# Patient Record
Sex: Female | Born: 1980
Health system: Southern US, Community
[De-identification: ages and names within clinical notes are randomized; demographics above are authoritative.]

## PROBLEM LIST (undated history)

## (undated) ENCOUNTER — Ambulatory Visit (HOSPITAL_COMMUNITY): Admission: EM | Payer: BLUE CROSS/BLUE SHIELD | Source: Home / Self Care

## (undated) DIAGNOSIS — M549 Dorsalgia, unspecified: Secondary | ICD-10-CM

## (undated) DIAGNOSIS — G8929 Other chronic pain: Secondary | ICD-10-CM

## (undated) DIAGNOSIS — M479 Spondylosis, unspecified: Secondary | ICD-10-CM

## (undated) DIAGNOSIS — M25559 Pain in unspecified hip: Secondary | ICD-10-CM

## (undated) DIAGNOSIS — R7303 Prediabetes: Secondary | ICD-10-CM

## (undated) DIAGNOSIS — G43909 Migraine, unspecified, not intractable, without status migrainosus: Secondary | ICD-10-CM

## (undated) DIAGNOSIS — K5900 Constipation, unspecified: Secondary | ICD-10-CM

## (undated) DIAGNOSIS — R12 Heartburn: Secondary | ICD-10-CM

## (undated) DIAGNOSIS — M2550 Pain in unspecified joint: Secondary | ICD-10-CM

## (undated) DIAGNOSIS — R0602 Shortness of breath: Secondary | ICD-10-CM

## (undated) DIAGNOSIS — F32A Depression, unspecified: Secondary | ICD-10-CM

## (undated) DIAGNOSIS — E559 Vitamin D deficiency, unspecified: Secondary | ICD-10-CM

## (undated) HISTORY — DX: Migraine, unspecified, not intractable, without status migrainosus: G43.909

## (undated) HISTORY — DX: Constipation, unspecified: K59.00

## (undated) HISTORY — DX: Pain in unspecified hip: M25.559

## (undated) HISTORY — DX: Pain in unspecified joint: M25.50

## (undated) HISTORY — DX: Depression, unspecified: F32.A

## (undated) HISTORY — DX: Vitamin D deficiency, unspecified: E55.9

## (undated) HISTORY — DX: Prediabetes: R73.03

## (undated) HISTORY — DX: Shortness of breath: R06.02

## (undated) HISTORY — DX: Spondylosis, unspecified: M47.9

## (undated) HISTORY — DX: Other chronic pain: G89.29

## (undated) HISTORY — DX: Morbid (severe) obesity due to excess calories: E66.01

## (undated) HISTORY — DX: Heartburn: R12

## (undated) HISTORY — DX: Dorsalgia, unspecified: M54.9

---

## 2014-06-16 ENCOUNTER — Inpatient Hospital Stay
Admit: 2014-06-16 | Discharge: 2014-06-16 | Disposition: A | Discharge status: Home / Self Care | Payer: BLUE CROSS/BLUE SHIELD | Attending: Emergency Medicine

## 2014-06-16 ENCOUNTER — Emergency Department: Admit: 2014-06-16 | Payer: BLUE CROSS/BLUE SHIELD

## 2014-06-16 DIAGNOSIS — G44209 Tension-type headache, unspecified, not intractable: Secondary | ICD-10-CM

## 2014-06-16 LAB — HCG URINE, QL. - POC: Pregnancy test,urine (POC): NEGATIVE

## 2014-06-16 MED ORDER — IBUPROFEN 800 MG TAB
800 mg | ORAL_TABLET | Freq: Three times a day (TID) | ORAL | Status: AC | PRN
Start: 2014-06-16 — End: ?

## 2014-06-16 NOTE — ED Notes (Signed)
I have reviewed discharge instructions with the patient.  The patient verbalized understanding.

## 2014-06-16 NOTE — ED Provider Notes (Signed)
HPI Comments: 34 year old African-American female presents today with occipital headache that started last night.  She states that she has had multiple headaches at least 2-3 headaches per week for the last 3-4 months.  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  She states it started out as a small bone mass 13 years ago but in the last year she states she has noticed that it is grown considerably and is painful at times.  Patient also reports dizziness and the fact that she feels lightheaded.  Denies visual disturbances, photophobia, neck pain.  She states she has not had a dilated eye exam and quite some time.  She reports that her last menstrual period was approximately 2 years ago however she has an IUD in place for birth control but denies being sexually active currently.  Patient also reports nausea but denies vomiting.  Urine pregnancy test here is negative.  ED urine dipstick is negative.    Patient is a 34 y.o. female presenting with headaches. The history is provided by the patient.   Headache   This is a new problem. The current episode started yesterday. The problem occurs constantly. The problem has not changed since onset.The headache is aggravated by nothing. The pain is located in the occipital region. The quality of the pain is described as dull. The pain is at a severity of 5/10. The pain is moderate. Associated symptoms include dizziness and nausea. Pertinent negatives include no fever, no malaise/fatigue, no palpitations, no syncope, no shortness of breath, no weakness, no tingling, no visual change and no vomiting. She has tried nothing for the symptoms.        History reviewed. No pertinent past medical history.    Past Surgical History:   Procedure Laterality Date   ??? Hx gyn       c section          History reviewed. No pertinent family history.    History      Social History   ??? Marital Status: DIVORCED     Spouse Name: N/A   ??? Number of Children: N/A   ??? Years of Education: N/A     Occupational History   ??? Not on file.     Social History Main Topics   ??? Smoking status: Never Smoker    ??? Smokeless tobacco: Not on file   ??? Alcohol Use: No   ??? Drug Use: No   ??? Sexual Activity: No     Other Topics Concern   ??? Not on file     Social History Narrative   ??? No narrative on file           ALLERGIES: Review of patient's allergies indicates no known allergies.      Review of Systems   Constitutional: Negative for fever, chills, malaise/fatigue, diaphoresis, activity change, appetite change and fatigue.   HENT: Negative for congestion, dental problem, ear pain, facial swelling, postnasal drip, rhinorrhea, sore throat and trouble swallowing.    Eyes: Negative for photophobia, pain and visual disturbance.   Respiratory: Negative for cough, chest tightness and shortness of breath.    Cardiovascular: Negative for chest pain, palpitations and syncope.   Gastrointestinal: Positive for nausea. Negative for vomiting, abdominal pain and diarrhea.   Musculoskeletal: Negative for myalgias, arthralgias, neck pain and neck stiffness.   Neurological: Positive for  dizziness and headaches. Negative for tingling, tremors, seizures, syncope, speech difficulty, weakness, light-headedness and numbness.   Psychiatric/Behavioral: Negative for sleep disturbance. The patient is not nervous/anxious.    All other systems reviewed and are negative.      Filed Vitals:    06/16/14 1011   BP: 133/71   Pulse: 78   Temp: 98.1 ??F (36.7 ??C)   Resp: 16   Height: 5' (1.524 m)   Weight: 113.399 kg (250 lb)   SpO2: 99%            Physical Exam   Constitutional: She is oriented to person, place, and time. Vital signs are normal. She appears well-developed and well-nourished. She is active.  Non-toxic appearance. She does not appear ill. No distress.   HENT:   Head: Normocephalic and atraumatic.        Right Ear: Tympanic membrane normal.   Left Ear: Tympanic membrane normal.   Nose: Nose normal.   Mouth/Throat: Uvula is midline, oropharynx is clear and moist and mucous membranes are normal.   Eyes: Conjunctivae and EOM are normal. Pupils are equal, round, and reactive to light.   Neck: Normal range of motion. Neck supple. No spinous process tenderness and no muscular tenderness present. No Brudzinski's sign and no Kernig's sign noted.   Cardiovascular: Normal rate, regular rhythm, normal heart sounds and intact distal pulses.  Exam reveals no gallop and no friction rub.    No murmur heard.  Pulmonary/Chest: Effort normal and breath sounds normal. No respiratory distress. She has no wheezes. She has no rales.   Musculoskeletal: Normal range of motion. She exhibits no edema or tenderness.   Lymphadenopathy:     She has no cervical adenopathy.   Neurological: She is alert and oriented to person, place, and time. She has normal strength and normal reflexes. No cranial nerve deficit or sensory deficit. Coordination and gait normal.   Pt. Ambulatory and moving about on exam table and in exam room without difficulty. No focal neurological deficits identified. Cerebellar/proprioception intact.   Skin: Skin is warm, dry and intact.   Psychiatric: She has a normal mood and affect. Her speech is normal and behavior is normal.   Nursing note and vitals reviewed.       MDM  Number of Diagnoses or Management Options      Procedures      Head CT Scan:  Impression: ??  1. No acute intracranial process.?????? ??  ??  I discussed the results of all labs, procedures, radiographs, and treatments with the patient and available family. Treatment plan is agreed upon and the patient is ready for discharge. All voiced understanding of the discharge plan and medication instructions or changes as appropriate. Questions about treatment in the ED were answered. All were encouraged to return should symptoms worsen or new problems develop.

## 2014-06-16 NOTE — ED Notes (Signed)
Pt reports HA since last night pain has improved some sinus pain and pressure as well

## 2014-07-07 ENCOUNTER — Encounter: Attending: Family Medicine

## 2015-06-13 ENCOUNTER — Emergency Department (HOSPITAL_COMMUNITY)
Admission: EM | Admit: 2015-06-13 | Discharge: 2015-06-13 | Disposition: A | Discharge status: Home / Self Care | Payer: BLUE CROSS/BLUE SHIELD | Attending: Emergency Medicine | Admitting: Emergency Medicine

## 2015-06-13 ENCOUNTER — Encounter (HOSPITAL_COMMUNITY): Payer: Self-pay | Admitting: Emergency Medicine

## 2015-06-13 DIAGNOSIS — J069 Acute upper respiratory infection, unspecified: Secondary | ICD-10-CM | POA: Insufficient documentation

## 2015-06-13 DIAGNOSIS — R0981 Nasal congestion: Secondary | ICD-10-CM | POA: Diagnosis present

## 2015-06-13 LAB — RAPID STREP SCREEN (MED CTR MEBANE ONLY): Streptococcus, Group A Screen (Direct): NEGATIVE

## 2015-06-13 MED ORDER — BENZONATATE 100 MG PO CAPS: 100.0000 mg | ORAL_CAPSULE | Freq: Three times a day (TID) | ORAL | Status: DC

## 2015-06-13 NOTE — ED Provider Notes (Signed)
CSN: [id number]     Arrival date & time 06/13/15  1831 History  By signing my name below, I, Phillis Haggis, attest that this documentation has been prepared under the direction and in the presence of Elpidio Anis, PA-C.  Electronically Signed: Phillis Haggis, ED Scribe. 06/13/2015. 8:53 PM.   Chief Complaint  Patient presents with  . URI   The history is provided by the patient. No language interpreter was used.  HPI Comments: Zanetta Dehaan is a 35 y.o. female who presents to the Emergency Department complaining of gradually worsening, non-productive cough onset 3 days ago. Pt reports associated fatigue, appetite change, sore throat, congestion, hot and cold chills, myalgias when laying down, and headache. She has taken Tussin and Delsym to no relief. Pt is a non-smoker. She denies nausea or vomiting.    History reviewed. No pertinent past medical history. History reviewed. No pertinent past surgical history. No family history on file. Social History  Substance Use Topics  . Smoking status: Never Smoker   . Smokeless tobacco: None  . Alcohol Use: None   OB History    No data available     Review of Systems  Constitutional: Positive for chills and appetite change. Negative for fever.  HENT: Positive for sore throat.   Respiratory: Positive for cough.   Neurological: Positive for headaches.   Allergies  Review of patient's allergies indicates not on file.  Home Medications   Prior to Admission medications   Not on File   BP 139/121 mmHg  Pulse 120  Temp(Src) 98.7 F (37.1 C) (Oral)  Resp 19  SpO2 96% Physical Exam  Constitutional: She is oriented to person, place, and time. She appears well-developed and well-nourished.  HENT:  Head: Normocephalic and atraumatic.  Nose: Mucosal edema present.  Eyes: Conjunctivae and EOM are normal. Pupils are equal, round, and reactive to light.  Neck: Normal range of motion. Neck supple.  Cardiovascular: Normal rate and regular  rhythm.   Pulmonary/Chest: Effort normal and breath sounds normal.  Abdominal: Soft. There is no tenderness.  Musculoskeletal: Normal range of motion.  Neurological: She is alert and oriented to person, place, and time.  Skin: Skin is warm and dry.  Psychiatric: She has a normal mood and affect. Her behavior is normal.  Nursing note and vitals reviewed.   ED Course  Procedures (including critical care time) DIAGNOSTIC STUDIES: Oxygen Saturation is 96% on RA, normal by my interpretation.    COORDINATION OF CARE: 9:18 PM-Discussed treatment plan which includes rapid strep screen and conservative care with pt at bedside and pt agreed to plan.    Labs Review Labs Reviewed  RAPID STREP SCREEN (NOT AT [facility])  CULTURE, GROUP A STREP [facility])   Results for orders placed or performed during the hospital encounter of 06/13/15  Rapid strep screen (not at [facility])  Result Value Ref Range   Streptococcus, Group A Screen (Direct) NEGATIVE NEGATIVE    Imaging Review No results found. I have personally reviewed and evaluated these images and lab results as part of my medical decision-making.   EKG Interpretation None      MDM   Final diagnoses:  None    1. URI  The patient presents with congestion, chills, cough for several days. Negative strep and normal exam. VSS, afebrile. Well appearing. Likely viral process.  I personally performed the services described in this documentation, which was scribed in my presence. The recorded information has been reviewed and is accurate.  Elpidio Anis, PA-C 06/13/15 2152  Tilden Fossa, MD 06/16/15 [phone]

## 2015-06-13 NOTE — Discharge Instructions (Signed)
Upper Respiratory Infection, Adult °Most upper respiratory infections (URIs) are a viral infection of the air passages leading to the lungs. A URI affects the nose, throat, and upper air passages. The most common type of URI is nasopharyngitis and is typically referred to as "the common cold." °URIs run their course and usually go away on their own. Most of the time, a URI does not require medical attention, but sometimes a bacterial infection in the upper airways can follow a viral infection. This is called a secondary infection. Sinus and middle ear infections are common types of secondary upper respiratory infections. °Bacterial pneumonia can also complicate a URI. A URI can worsen asthma and chronic obstructive pulmonary disease (COPD). Sometimes, these complications can require emergency medical care and may be life threatening.  °CAUSES °Almost all URIs are caused by viruses. A virus is a type of germ and can spread from one person to another.  °RISKS FACTORS °You may be at risk for a URI if:  °· You smoke.   °· You have chronic heart or lung disease. °· You have a weakened defense (immune) system.   °· You are very young or very old.   °· You have nasal allergies or asthma. °· You work in crowded or poorly ventilated areas. °· You work in health care facilities or schools. °SIGNS AND SYMPTOMS  °Symptoms typically develop 2-3 days after you come in contact with a cold virus. Most viral URIs last 7-10 days. However, viral URIs from the influenza virus (flu virus) can last 14-18 days and are typically more severe. Symptoms may include:  °· Runny or stuffy (congested) nose.   °· Sneezing.   °· Cough.   °· Sore throat.   °· Headache.   °· Fatigue.   °· Fever.   °· Loss of appetite.   °· Pain in your forehead, behind your eyes, and over your cheekbones (sinus pain). °· Muscle aches.   °DIAGNOSIS  °Your health care provider may diagnose a URI by: °· Physical exam. °· Tests to check that your symptoms are not due to  another condition such as: °· Strep throat. °· Sinusitis. °· Pneumonia. °· Asthma. °TREATMENT  °A URI goes away on its own with time. It cannot be cured with medicines, but medicines may be prescribed or recommended to relieve symptoms. Medicines may help: °· Reduce your fever. °· Reduce your cough. °· Relieve nasal congestion. °HOME CARE INSTRUCTIONS  °· Take medicines only as directed by your health care provider.   °· Gargle warm saltwater or take cough drops to comfort your throat as directed by your health care provider. °· Use a warm mist humidifier or inhale steam from a shower to increase air moisture. This may make it easier to breathe. °· Drink enough fluid to keep your urine clear or pale yellow.   °· Eat soups and other clear broths and maintain good nutrition.   °· Rest as needed.   °· Return to work when your temperature has returned to normal or as your health care provider advises. You may need to stay home longer to avoid infecting others. You can also use a face mask and careful hand washing to prevent spread of the virus. °· Increase the usage of your inhaler if you have asthma.   °· Do not use any tobacco products, including cigarettes, chewing tobacco, or electronic cigarettes. If you need help quitting, ask your health care provider. °PREVENTION  °The best way to protect yourself from getting a cold is to practice good hygiene.  °· Avoid oral or hand contact with people with cold   symptoms.   °· Wash your hands often if contact occurs.   °There is no clear evidence that vitamin C, vitamin E, echinacea, or exercise reduces the chance of developing a cold. However, it is always recommended to get plenty of rest, exercise, and practice good nutrition.  °SEEK MEDICAL CARE IF:  °· You are getting worse rather than better.   °· Your symptoms are not controlled by medicine.   °· You have chills. °· You have worsening shortness of breath. °· You have brown or red mucus. °· You have yellow or brown nasal  discharge. °· You have pain in your face, especially when you bend forward. °· You have a fever. °· You have swollen neck glands. °· You have pain while swallowing. °· You have white areas in the back of your throat. °SEEK IMMEDIATE MEDICAL CARE IF:  °· You have severe or persistent: °¨ Headache. °¨ Ear pain. °¨ Sinus pain. °¨ Chest pain. °· You have chronic lung disease and any of the following: °¨ Wheezing. °¨ Prolonged cough. °¨ Coughing up blood. °¨ A change in your usual mucus. °· You have a stiff neck. °· You have changes in your: °¨ Vision. °¨ Hearing. °¨ Thinking. °¨ Mood. °MAKE SURE YOU:  °· Understand these instructions. °· Will watch your condition. °· Will get help right away if you are not doing well or get worse. °  °This information is not intended to replace advice given to you by your health care provider. Make sure you discuss any questions you have with your health care provider. °  °Document Released: 10/02/2000 Document Revised: 08/23/2014 Document Reviewed: 07/14/2013 °Elsevier Interactive Patient Education ©2016 Elsevier Inc. ° °Cough, Adult °A cough helps to clear your throat and lungs. A cough may last only 2-3 weeks (acute), or it may last longer than 8 weeks (chronic). Many different things can cause a cough. A cough may be a sign of an illness or another medical condition. °HOME CARE °· Pay attention to any changes in your cough. °· Take medicines only as told by your doctor. °¨ If you were prescribed an antibiotic medicine, take it as told by your doctor. Do not stop taking it even if you start to feel better. °¨ Talk with your doctor before you try using a cough medicine. °· Drink enough fluid to keep your pee (urine) clear or pale yellow. °· If the air is dry, use a cold steam vaporizer or humidifier in your home. °· Stay away from things that make you cough at work or at home. °· If your cough is worse at night, try using extra pillows to raise your head up higher while you  sleep. °· Do not smoke, and try not to be around smoke. If you need help quitting, ask your doctor. °· Do not have caffeine. °· Do not drink alcohol. °· Rest as needed. °GET HELP IF: °· You have new problems (symptoms). °· You cough up yellow fluid (pus). °· Your cough does not get better after 2-3 weeks, or your cough gets worse. °· Medicine does not help your cough and you are not sleeping well. °· You have pain that gets worse or pain that is not helped with medicine. °· You have a fever. °· You are losing weight and you do not know why. °· You have night sweats. °GET HELP RIGHT AWAY IF: °· You cough up blood. °· You have trouble breathing. °· Your heartbeat is very fast. °  °This information is not intended to replace   advice given to you by your health care provider. Make sure you discuss any questions you have with your health care provider.   Document Released: 12/20/2010 Document Revised: 12/28/2014 Document Reviewed: 06/15/2014 Elsevier Interactive Patient Education 2016 [employer].  Emergency Department Resource Guide 1) Find a Doctor and Pay Out of Pocket Although you won't have to find out who is covered by your insurance plan, it is a good idea to ask around and get recommendations. You will then need to call the office and see if the doctor you have chosen will accept you as a new patient and what types of options they offer for patients who are self-pay. Some doctors offer discounts or will set up payment plans for their patients who do not have insurance, but you will need to ask so you aren't surprised when you get to your appointment.  2) Contact Your Local Health Department Not all health departments have doctors that can see patients for sick visits, but many do, so it is worth a call to see if yours does. If you don't know where your local health department is, you can check in your phone book. The CDC also has a tool to help you locate your state's health department, and many state  websites also have listings of all of their local health departments.  3) Find a Walk-in Clinic If your illness is not likely to be very severe or complicated, you may want to try a walk in clinic. These are popping up all over the country in pharmacies, drugstores, and shopping centers. They're usually staffed by nurse practitioners or physician assistants that have been trained to treat common illnesses and complaints. They're usually fairly quick and inexpensive. However, if you have serious medical issues or chronic medical problems, these are probably not your best option.  No Primary Care Doctor: - Call Health Connect at  [phone] - they can help you locate a primary care doctor that  accepts your insurance, provides certain services, etc. - Physician Referral Service- [phone]  Chronic Pain Problems: Organization         Address  Phone   Notes  [facility]  [phone] Patients need to be referred by their primary care doctor.   Medication Assistance: Organization         Address  Phone   Notes  [facility] Medication [facility] [address] [place] [place], [place] [address] [phone] --Must be a resident of [facility] -- Must have NO insurance coverage whatsoever (no Medicaid/ Medicare, etc.) -- The pt. MUST have a primary care doctor that directs their care regularly and follows them in the community   [facility]  [phone]   [employer]  [phone]    Agencies that provide inexpensive medical care: Organization         Address  Phone   Notes  [facility]  [phone]   [facility]    [phone]   [facility] [address] [place], [place] [address] [phone]   [facility] [address] [place], [place] [phone]   Planned Parenthood    [phone]   [facility]    [phone]   [facility]  [address], [place] Phone:  [phone], Fax:  [phone] Hours of Operation:  9 am - 6 pm, M-F.  Also accepts Medicaid/Medicare and  self-pay.  [facility]  [address], [place] Phone: [phone], Fax: [phone]. Hours of Operation:  8:30 am - 5:30 pm, M-F.  Also accepts Medicaid and self-pay.  [facility] [place] [address], [place] Phone: [phone]   [facility] [address] Natasha Bence [place], [place] [phone], Ext. [phone] Mondays & Thursdays: 7-9 AM.  First 15 patients are seen on a first come, first serve basis.    Medicaid-accepting [facility] Providers:  Organization         Address  Phone   Notes  [facility] [address], [place] [phone] Also accepts self-pay patients.  [facility] [address] Laurell Josephs [place], [place]  [phone]   [facility] [address], [place] [phone]   [facility] [address] [place] [phone]   Renaye Rakers [address], [place]   [phone] Only accepts [place] Access [place] patients after they have their name applied to their card.   Self-Pay (no insurance) in [facility]:  Organization         Address  Phone   Notes  Sickle Cell Patients, [facility] Internal Medicine [address] [place], [place] [phone]   [facility] [address] [place], [place] [phone]   [facility]  [address], [place] [phone]   [facility]/Dr. Osei-Bonsu  [address], [place] or [address], [place] [phone] Phone number for both [place] and [place] locations is the same.  [facility] [address], [place] [phone]   [facility] [address], [place] or [address] [phone]   [facility] [address] [place] [phone], phone; [phone], fax Sees patients 1st and 3rd Saturday of every month.  Must not qualify for public or private insurance (i.e. Medicaid, Medicare, [place] Health Choice, Veterans' Benefits)  Household income should be no more than 200% of the poverty level The clinic cannot treat you if you are pregnant or think you are pregnant  Sexually transmitted diseases are not treated at the clinic.    Dental Care: Organization         Address  Phone  Notes  [facility] Department of [facility] [address] [place], [place] [phone] Accepts children up to age 79 who are enrolled in [place] or [place] Health Choice; pregnant women with a Medicaid card; and children who have applied for Medicaid or [place] Health Choice, but were declined, whose parents can pay a reduced fee at time of service.  [facility]  [address], [place] [phone] Accepts children up to age 56 who are enrolled in [place] or [place] Health Choice; pregnant women with a Medicaid card; and children who have applied for Medicaid or [place] Health Choice, but were declined, whose parents can pay a reduced fee at time of service.  [facility]  [address] [place], [place] [phone] Patients are seen by appointment only. Walk-ins are not accepted. [facility] will see patients 78 years of age and older. Monday - Tuesday (8am-5pm) Most Wednesdays (8:30-5pm) $30  per visit, cash only  [facility] Adult Dental Access PRO[facility]  [address], [facility] [phone] Patients are seen by appointment only. Walk-ins are not accepted. [facility] will see patients 38 years of age and older. One Wednesday Evening (Monthly: Volunteer Based).  $30 per visit, cash only  [employer]  [phone] for adults; Children under age 21, call Graduate Pediatric Dentistry at [phone]. Children aged 31-14, please call [phone] to request a pediatric application.  Dental services are provided in all areas of dental care including fillings, crowns and bridges, complete and partial dentures, implants, gum treatment, root canals, and extractions. Preventive care is also provided. Treatment is provided to both adults and children. Patients are selected via a lottery and there is often a waiting list.   [facility] [address], [place]  [phone] [email or web address]   [facility] [address] [place], [place] [phone], Ext. [phone] Second and Fourth Thursday of each month, opens at 6:30 AM; Clinic ends at 9 AM.  Patients are seen on a first-come first-served basis, and a limited number are seen during each clinic.   [facility]  [address] [place], [place] [phone]   Eligibility Requirements You must have lived in [place], [place], or [place] counties for at least the last three months.   You cannot be eligible for state or federal sponsored [place], including [employer], [place], or [employer].   You generally cannot be eligible for healthcare insurance through your employer.    How to apply: Eligibility screenings are held every Tuesday and Wednesday afternoon from 1:00 pm until 4:00 pm. You do not need an appointment for the interview!  [facility] [address] [place], [place] [phone]   [facility] Health Department  [phone]   [facility] Health Department  [phone]   [facility] Health [facility]  [phone]    Behavioral Health Resources in the Community: Intensive Outpatient Programs Organization         Address  Phone  Notes  [facility] [address], [place], [place]  [phone]   [facility] Outpatient [address] [place], [place] [phone]   [facility] [address], [place], [place]  [phone]   [facility] Mental Health [address]  [place], [place] [phone] or [phone]   Substance Abuse Resources Organization         Address  Phone  Notes  [facility]  [phone]   [facility]  [phone]   [facility]  [phone]   Floydene Flock  [phone]   Residential & Outpatient Substance Abuse Program  [phone]   Psychological Services Organization         Address  Phone  Notes  [facility] Behavioral Health  [phone]   [facility] Services  [phone]   [facility] Mental [facility] [address] [place] [phone] or [phone]    Mobile Crisis Teams Organization         Address  Phone  Notes  [facility], Mobile Crisis Care Unit  [phone]   [facility]  [address] [place], [place] [phone]   Doristine Locks [address] [place] [phone]    Self-Help/Support Groups Organization         Address  Phone  Notes  [facility] [place] - variety of support groups  [phone]- I7437963 Call for more information  Narcotics Anonymous (NA), [facility] [address], [employer] [place]  2 meetings at this location   Statistician         Address  Phone  Notes  [facility] [address],    [place]  [phone]   [facility]  [address], [place] [address], [place], [place] [phone]   [facility] [address] [place], [place] [phone] Admissions: 8am-3pm M-F  [facility] [address]    [place], [place] [phone]   [facility] [address] [place], [place], [place] [phone]   [facility] [address],   [place], [place] [phone]   [facility] - Intensive Outpatient [address] Laurell Josephs [address], [place], [place] [phone]   [facility] ([facility] [address] [place]  [place], [place] [phone] or [phone]   [facility] ([facility]) [address] [place], [place] [phone] Accepts Medicaid  [facility] [address],  [place] [phone] Substance Abuse/Addiction Treatment   [facility] Organization         Address  Phone  Notes  [facility]  [phone]   Angie Fava, PhD [address] Ervin Knack [place], [place]   [phone] or 7437110025   [facility] Behavioral   [address] [place], [place] [phone]   [facility] [address] [place], [place] [phone] Insurance/Medicaid/sponsorship through [facility] and Families [address]                                    [place], [place] [phone] Therapy/tele-psych/case  [facility] [address], [place] [phone]    Dr. Lolly Mustache  [phone]   [facility]  [employer] [facility] Dept. 1) [address], [place] 2) [address], [place] 3)  [address], [place] [phone]  [phone]   [facility] Child Abuse Hotline 856-339-3817 or [phone] (After Hours)

## 2015-06-13 NOTE — ED Notes (Signed)
Pt's aunt is on the phone demanding a different provider and threatening to sue if the patient and her son are discharged without additional tests.  PA is aware, charge RN notified. This nurse explained multiple times over the phone that due diligence has been done and there is no indication that additional tests are needed; PA Melvenia Beam explained the same information to the patient in person.

## 2015-06-13 NOTE — ED Notes (Signed)
Per patient states cold symptoms today, cough and sore throat

## 2015-06-16 LAB — CULTURE, GROUP A STREP (THRC)

## 2015-06-17 ENCOUNTER — Telehealth (HOSPITAL_COMMUNITY): Payer: Self-pay

## 2015-06-17 NOTE — Progress Notes (Signed)
ED Antimicrobial Stewardship Positive Culture Follow Up   Donna Bond is an 35 y.o. female who presented to [facility] on 06/13/2015 with a chief complaint of  Chief Complaint  Patient presents with  . URI    Recent Results (from the past 720 hour(s))  Rapid strep screen (not at [facility])     Status: None   Collection Time: 06/13/15  7:02 PM  Result Value Ref Range Status   Streptococcus, Group A Screen (Direct) NEGATIVE NEGATIVE Final    Comment: (NOTE) A Rapid Antigen test may result negative if the antigen level in the sample is below the detection level of this test. The FDA has not cleared this test as a stand-alone test therefore the rapid antigen negative result has reflexed to a Group A Strep culture.   Culture, group A strep     Status: None   Collection Time: 06/13/15  7:02 PM  Result Value Ref Range Status   Specimen Description THROAT  Final   Special Requests NONE  Final   Culture   Final    RARE STREPTOCOCCUS,BETA HEMOLYTIC NOT GROUP A Performed at [facility]    Report Status 06/16/2015 FINAL  Final     Treated with , organism resistant to prescribed antimicrobial  Patient discharged originally without antimicrobial agent and treatment is now indicated  New antibiotic prescription: If pt is still symptomatic, start amoxicillin  PO BID x 10 days (Will verify allergy information)  ED Provider: Cheri Fowler, PA   Callee Rohrig, Drake Leach 06/17/2015, 10:00 AM Clinical Pharmacist Phone# [phone]

## 2015-06-17 NOTE — Telephone Encounter (Signed)
Post ED Visit - Positive Culture Follow-up: Chart Hand-off to ED Flow Manager  Culture assessed and recommendations reviewed by:  Isaac Bliss, Pharm.D., BCPS  Celedonio Miyamoto, Pharm.D., BCPS-AQ ID  Georgina Pillion, [address]D., BCPS  [place], [address]D., BCPS, AAHIVP  Estella Husk, Pharm .D., BCPS, AAHIVP  Tennis Must, Pharm.D.  Casilda Carls, Pharm.D. Carmon Sails [place], Pharm D  Positive strep culture   Patient discharged without antimicrobial prescription and treatment is now indicated  Organism is resistant to prescribed ED discharge antimicrobial  Patient with positive blood cultures  Changes discussed with ED provider: Cheri Fowler PA New antibiotic prescription if symptomatic give amoxicillin 500 mg po bid x 10 day  Letter sent to address on file.    Ashley Jacobs 06/17/2015, 4:17 PM

## 2016-10-03 ENCOUNTER — Ambulatory Visit (HOSPITAL_COMMUNITY)
Admission: EM | Admit: 2016-10-03 | Discharge: 2016-10-03 | Disposition: A | Discharge status: Home / Self Care | Payer: BLUE CROSS/BLUE SHIELD | Attending: Internal Medicine | Admitting: Internal Medicine

## 2016-10-03 ENCOUNTER — Encounter (HOSPITAL_COMMUNITY): Payer: Self-pay | Admitting: Emergency Medicine

## 2016-10-03 DIAGNOSIS — J019 Acute sinusitis, unspecified: Secondary | ICD-10-CM | POA: Diagnosis not present

## 2016-10-03 MED ORDER — AMOXICILLIN-POT CLAVULANATE 875-125 MG PO TABS: 2.0000 | ORAL_TABLET | Freq: Two times a day (BID) | ORAL | 0 refills | Status: DC

## 2016-10-03 MED ORDER — FLUCONAZOLE 200 MG PO TABS: ORAL_TABLET | ORAL | 0 refills | Status: DC

## 2016-10-03 NOTE — ED Triage Notes (Signed)
Pt complains of a cough, SOB, nasal congestion, and headache for one week. Pt has been taking NyQuil and Claritin D with no relief.

## 2016-10-03 NOTE — ED Provider Notes (Signed)
CSN: [id number]     Arrival date & time 10/03/16  1016 History   First MD Initiated Contact with Patient 10/03/16 1058     Chief Complaint  Patient presents with  . URI   (Consider location/radiation/quality/duration/timing/severity/associated sxs/prior Treatment) The history is provided by the patient.  URI  Presenting symptoms: congestion, facial pain and fever   Presenting symptoms: no fatigue, no rhinorrhea and no sore throat   Severity:  Moderate Onset quality:  Gradual Duration:  8 days Timing:  Constant Progression:  Unchanged Chronicity:  New Relieved by:  Nothing Worsened by:  Nothing Ineffective treatments:  Decongestant Associated symptoms: headaches and sinus pain   Associated symptoms: no arthralgias, no myalgias, no neck pain and no swollen glands     History reviewed. No pertinent past medical history. Past Surgical History:  Procedure Laterality Date  . CESAREAN SECTION     History reviewed. No pertinent family history. Social History  Substance Use Topics  . Smoking status: Never Smoker  . Smokeless tobacco: Never Used  . Alcohol use Yes     Comment: occasional   OB History    No data available     Review of Systems  Constitutional: Positive for fever. Negative for chills and fatigue.  HENT: Positive for congestion and sinus pain. Negative for rhinorrhea and sore throat.   Respiratory: Negative.   Cardiovascular: Negative.   Gastrointestinal: Negative.   Genitourinary: Negative.   Musculoskeletal: Negative for arthralgias, myalgias and neck pain.  Skin: Negative.   Neurological: Positive for headaches. Negative for light-headedness.    Allergies  Patient has no known allergies.  Home Medications   Prior to Admission medications   Medication Sig Start Date End Date Taking? Authorizing Provider  amoxicillin-clavulanate (AUGMENTIN) 875-125 MG tablet Take 2 tablets by mouth 2 (two) times daily. 10/03/16   [name], Kaelynn Igo, NP  fluconazole  (DIFLUCAN) 200 MG tablet Take one tablet today, wait 3 days, take the second tablet 10/03/16   [name], Ashni Lonzo, NP   Meds Ordered and Administered this Visit  Medications - No data to display  BP 115/61 (BP Location: Right Wrist)   Pulse 80   Temp 98.6 F (37 C) (Oral)   SpO2 100%  No data found.   Physical Exam  Constitutional: She is oriented to person, place, and time. She appears well-developed and well-nourished. No distress.  HENT:  Head: Normocephalic and atraumatic.  Right Ear: External ear normal.  Left Ear: External ear normal.  Nose: Right sinus exhibits maxillary sinus tenderness. Left sinus exhibits maxillary sinus tenderness.  Mouth/Throat: Oropharynx is clear and moist.  Eyes: Conjunctivae are normal.  Neck: Normal range of motion.  Cardiovascular: Normal rate and regular rhythm.   Pulmonary/Chest: Effort normal and breath sounds normal.  Lymphadenopathy:    She has no cervical adenopathy.  Neurological: She is alert and oriented to person, place, and time.  Skin: Skin is warm and dry. Capillary refill takes less than 2 seconds. No rash noted. She is not diaphoretic. No erythema.  Psychiatric: She has a normal mood and affect. Her behavior is normal.  Nursing note and vitals reviewed.   Urgent Care Course     Procedures (including critical care time)  Labs Review Labs Reviewed - No data to display  Imaging Review No results found.    MDM   1. Acute sinusitis, recurrence not specified, unspecified location     Started on Augmentin, provided counseling on OTC medications for symptoms relief, provided diflucan for prophylactic treatment  of yeast and given directions regarding its use.    Dorena Bodo, NP 10/03/16 [phone]

## 2016-10-03 NOTE — Discharge Instructions (Signed)
You have acute sinusitis. I have prescribed Augmentin to treat your infection. Take 2 tablet twice a day for 7 days. You may take Tylenol every 4 hours for fever, pain, or headache, not to exceed 4,000 mg a day, or you may take Ibuprofen every 6-8 hours, not to exceed 1600 mg a day. For congestion, you may use Flonase nasal spray 2 sprays, each nostril once a day, or a pseudoephedrine containing product daily. I also recommend Mucinex, or Mucinex DM twice daily with a full glass of water.

## 2016-11-13 ENCOUNTER — Emergency Department (HOSPITAL_COMMUNITY)
Admission: EM | Admit: 2016-11-13 | Discharge: 2016-11-13 | Disposition: A | Discharge status: Home / Self Care | Payer: BLUE CROSS/BLUE SHIELD | Attending: Emergency Medicine | Admitting: Emergency Medicine

## 2016-11-13 ENCOUNTER — Encounter (HOSPITAL_COMMUNITY): Payer: Self-pay | Admitting: Emergency Medicine

## 2016-11-13 DIAGNOSIS — Y999 Unspecified external cause status: Secondary | ICD-10-CM | POA: Insufficient documentation

## 2016-11-13 DIAGNOSIS — M62830 Muscle spasm of back: Secondary | ICD-10-CM | POA: Diagnosis not present

## 2016-11-13 DIAGNOSIS — Y939 Activity, unspecified: Secondary | ICD-10-CM | POA: Diagnosis not present

## 2016-11-13 DIAGNOSIS — Y929 Unspecified place or not applicable: Secondary | ICD-10-CM | POA: Insufficient documentation

## 2016-11-13 DIAGNOSIS — M542 Cervicalgia: Secondary | ICD-10-CM | POA: Insufficient documentation

## 2016-11-13 DIAGNOSIS — M545 Low back pain: Secondary | ICD-10-CM | POA: Insufficient documentation

## 2016-11-13 DIAGNOSIS — M62838 Other muscle spasm: Secondary | ICD-10-CM

## 2016-11-13 MED ORDER — DIAZEPAM 5 MG PO TABS
5.0000 mg | ORAL_TABLET | Freq: Once | ORAL | Status: DC
Start: 2016-11-13 — End: 2016-11-13

## 2016-11-13 MED ORDER — CYCLOBENZAPRINE HCL 10 MG PO TABS
10.0000 mg | ORAL_TABLET | Freq: Once | ORAL | Status: AC
  Administered 2016-11-13: 10 mg via ORAL
  Filled 2016-11-13: qty 1

## 2016-11-13 MED ORDER — CYCLOBENZAPRINE HCL 10 MG PO TABS: 10.0000 mg | ORAL_TABLET | Freq: Every evening | ORAL | 0 refills | Status: DC | PRN

## 2016-11-13 MED ORDER — IBUPROFEN 200 MG PO TABS
600.0000 mg | ORAL_TABLET | Freq: Once | ORAL | Status: AC
  Administered 2016-11-13: 600 mg via ORAL
  Filled 2016-11-13: qty 3

## 2016-11-13 NOTE — Discharge Instructions (Signed)
Please take Ibuprofen (Advil, motrin) and Tylenol (acetaminophen) to relieve your pain.  You may take up to 600 MG (3 pills) of normal strength ibuprofen every 8 hours as needed.  In between doses of ibuprofen you make take tylenol, up to 1,000 mg (two extra strength pills).  Do not take more than 3,000 mg tylenol in a 24 hour period.  Please check all medication labels as many medications such as pain and cold medications may contain tylenol.  Do not drink alcohol while taking these medications.  Do not take other NSAID'S while taking ibuprofen (such as aleve or naproxen).  Please take ibuprofen with food to decrease stomach upset. ° °You are being prescribed a medication which may make you sleepy. For 24 hours after one dose please do not drive, operate heavy machinery, care for a small child with out another adult present, or perform any activities that may cause harm to you or someone else if you were to fall asleep or be impaired.  ° °
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
## 2016-11-13 NOTE — ED Notes (Signed)
Pt. Requested for Flexeril prior to discharge , to notify PA.

## 2017-05-17 ENCOUNTER — Ambulatory Visit: Payer: Self-pay | Admitting: Family Medicine

## 2017-05-17 VITALS — BP 115/85 | HR 70 | Temp 97.2°F | Resp 16 | Wt 288.4 lb

## 2017-05-17 DIAGNOSIS — M5432 Sciatica, left side: Secondary | ICD-10-CM

## 2017-05-17 DIAGNOSIS — M5416 Radiculopathy, lumbar region: Secondary | ICD-10-CM

## 2017-05-17 MED ORDER — PREDNISONE 20 MG PO TABS: ORAL_TABLET | ORAL | 0 refills | Status: DC

## 2017-05-17 MED ORDER — GABAPENTIN 300 MG PO CAPS: 300.0000 mg | ORAL_CAPSULE | Freq: Three times a day (TID) | ORAL | 3 refills | Status: DC

## 2017-05-17 MED ORDER — CYCLOBENZAPRINE HCL 10 MG PO TABS: 10.0000 mg | ORAL_TABLET | Freq: Three times a day (TID) | ORAL | 0 refills | Status: DC | PRN
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
## 2017-05-17 NOTE — Patient Instructions (Signed)
Take Prednisone 20 mg,  in mornings with breakfast as follows:  Take 3 pills for 3 days, Take 2 pills for 3 days, and Take 1 pill for 3 days.  Complete all medication.  For acute pain I have prescribed you Gabapentin 300 mg  and or cyclobenzaprine 3 times daily as needed.     Sciatica Sciatica is pain, numbness, weakness, or tingling along the path of the sciatic nerve. The sciatic nerve starts in the lower back and runs down the back of each leg. The nerve controls the muscles in the lower leg and in the back of the knee. It also provides feeling (sensation) to the back of the thigh, the lower leg, and the sole of the foot. Sciatica is a symptom of another medical condition that pinches or puts pressure on the sciatic nerve. Generally, sciatica only affects one side of the body. Sciatica usually goes away on its own or with treatment. In some cases, sciatica may keep coming back (recur). What are the causes? This condition is caused by pressure on the sciatic nerve, or pinching of the sciatic nerve. This may be the result of:  A disk in between the bones of the spine (vertebrae) bulging out too far (herniated disk).  Age-related changes in the spinal disks (degenerative disk disease).  A pain disorder that affects a muscle in the buttock (piriformis syndrome).  Extra bone growth (bone spur) near the sciatic nerve.  An injury or break (fracture) of the pelvis.  Pregnancy.  Tumor (rare).  What increases the risk? The following factors may make you more likely to develop this condition:  Playing sports that place pressure or stress on the spine, such as football or weight lifting.  Having poor strength and flexibility.  A history of back injury.  A history of back surgery.  Sitting for long periods of time.  Doing activities that involve repetitive bending or lifting.  Obesity.  What are the signs or symptoms? Symptoms can vary from mild to very severe, and they may  include:  Any of these problems in the lower back, leg, hip, or buttock: ? Mild tingling or dull aches. ? Burning sensations. ? Sharp pains.  Numbness in the back of the calf or the sole of the foot.  Leg weakness.  Severe back pain that makes movement difficult.  These symptoms may get worse when you cough, sneeze, or laugh, or when you sit or stand for long periods of time. Being overweight may also make symptoms worse. In some cases, symptoms may recur over time. How is this diagnosed? This condition may be diagnosed based on:  Your symptoms.  A physical exam. Your health care provider may ask you to do certain movements to check whether those movements trigger your symptoms.  You may have tests, including: ? Blood tests. ? X-rays. ? MRI. ? CT scan.  How is this treated? In many cases, this condition improves on its own, without any treatment. However, treatment may include:  Reducing or modifying physical activity during periods of pain.  Exercising and stretching to strengthen your abdomen and improve the flexibility of your spine.  Icing and applying heat to the affected area.  Medicines that help: ? To relieve pain and swelling. ? To relax your muscles.  Injections of medicines that help to relieve pain, irritation, and inflammation around the sciatic nerve (steroids).  Surgery.  Follow these instructions at home: Medicines  Take over-the-counter and prescription medicines only as told by your health  care provider.  Do not drive or operate heavy machinery while taking prescription pain medicine. Managing pain  If directed, apply ice to the affected area. ? Put ice in a plastic bag. ? Place a towel between your skin and the bag. ? Leave the ice on for 20 minutes, 2-3 times a day.  After icing, apply heat to the affected area before you exercise or as often as told by your health care provider. Use the heat source that your health care provider  recommends, such as a moist heat pack or a heating pad. ? Place a towel between your skin and the heat source. ? Leave the heat on for 20-30 minutes. ? Remove the heat if your skin turns bright red. This is especially important if you are unable to feel pain, heat, or cold. You may have a greater risk of getting burned. Activity  Return to your normal activities as told by your health care provider. Ask your health care provider what activities are safe for you. ? Avoid activities that make your symptoms worse.  Take brief periods of rest throughout the day. Resting in a lying or standing position is usually better than sitting to rest. ? When you rest for longer periods, mix in some mild activity or stretching between periods of rest. This will help to prevent stiffness and pain. ? Avoid sitting for long periods of time without moving. Get up and move around at least one time each hour.  Exercise and stretch regularly, as told by your health care provider.  Do not lift anything that is heavier than 10 lb (4.5 kg) while you have symptoms of sciatica. When you do not have symptoms, you should still avoid heavy lifting, especially repetitive heavy lifting.  When you lift objects, always use proper lifting technique, which includes: ? Bending your knees. ? Keeping the load close to your body. ? Avoiding twisting. General instructions  Use good posture. ? Avoid leaning forward while sitting. ? Avoid hunching over while standing.  Maintain a healthy weight. Excess weight puts extra stress on your back and makes it difficult to maintain good posture.  Wear supportive, comfortable shoes. Avoid wearing high heels.  Avoid sleeping on a mattress that is too soft or too hard. A mattress that is firm enough to support your back when you sleep may help to reduce your pain.  Keep all follow-up visits as told by your health care provider. This is important. Contact a health care provider  if:  You have pain that wakes you up when you are sleeping.  You have pain that gets worse when you lie down.  Your pain is worse than you have experienced in the past.  Your pain lasts longer than 4 weeks.  You experience unexplained weight loss. Get help right away if:  You lose control of your bowel or bladder (incontinence).  You have: ? Weakness in your lower back, pelvis, buttocks, or legs that gets worse. ? Redness or swelling of your back. ? A burning sensation when you urinate. This information is not intended to replace advice given to you by your health care provider. Make sure you discuss any questions you have with your health care provider. Document Released: 04/02/2001 Document Revised: 09/12/2015 Document Reviewed: 12/16/2014 Elsevier Interactive Patient Education  [employer].

## 2017-06-10 ENCOUNTER — Ambulatory Visit (INDEPENDENT_AMBULATORY_CARE_PROVIDER_SITE_OTHER): Payer: Self-pay | Admitting: Family Medicine

## 2017-06-10 ENCOUNTER — Ambulatory Visit (HOSPITAL_COMMUNITY)
Admission: RE | Admit: 2017-06-10 | Discharge: 2017-06-10 | Disposition: A | Discharge status: Home / Self Care | Payer: BLUE CROSS/BLUE SHIELD | Source: Ambulatory Visit | Attending: Family Medicine | Admitting: Family Medicine

## 2017-06-10 ENCOUNTER — Encounter: Payer: Self-pay | Admitting: Family Medicine

## 2017-06-10 VITALS — BP 136/80 | HR 78 | Temp 98.0°F | Resp 18 | Ht 61.0 in | Wt 295.0 lb

## 2017-06-10 DIAGNOSIS — M5441 Lumbago with sciatica, right side: Secondary | ICD-10-CM | POA: Insufficient documentation

## 2017-06-10 DIAGNOSIS — Z131 Encounter for screening for diabetes mellitus: Secondary | ICD-10-CM

## 2017-06-10 DIAGNOSIS — M5442 Lumbago with sciatica, left side: Secondary | ICD-10-CM

## 2017-06-10 DIAGNOSIS — R030 Elevated blood-pressure reading, without diagnosis of hypertension: Secondary | ICD-10-CM

## 2017-06-10 DIAGNOSIS — Z136 Encounter for screening for cardiovascular disorders: Secondary | ICD-10-CM

## 2017-06-10 DIAGNOSIS — G8929 Other chronic pain: Secondary | ICD-10-CM | POA: Insufficient documentation

## 2017-06-10 DIAGNOSIS — Z1329 Encounter for screening for other suspected endocrine disorder: Secondary | ICD-10-CM

## 2017-06-10 LAB — POCT URINALYSIS DIP (DEVICE)
BILIRUBIN URINE: NEGATIVE
Glucose, UA: NEGATIVE mg/dL
Ketones, ur: NEGATIVE mg/dL
LEUKOCYTES UA: NEGATIVE
NITRITE: NEGATIVE
Protein, ur: NEGATIVE mg/dL
Specific Gravity, Urine: 1.015 (ref 1.005–1.030)
Urobilinogen, UA: 0.2 mg/dL (ref 0.0–1.0)
pH: 7.5 (ref 5.0–8.0)

## 2017-06-10 LAB — POCT GLYCOSYLATED HEMOGLOBIN (HGB A1C): HEMOGLOBIN A1C: 5.8

## 2017-06-10 MED ORDER — NAPROXEN 500 MG PO TABS: 500.0000 mg | ORAL_TABLET | Freq: Two times a day (BID) | ORAL | 0 refills | Status: DC

## 2017-06-10 MED ORDER — KETOROLAC TROMETHAMINE 30 MG/ML IJ SOLN
30.0000 mg | Freq: Once | INTRAMUSCULAR | Status: AC
  Administered 2017-06-10: 30 mg via INTRAMUSCULAR

## 2017-06-10 MED ORDER — CYCLOBENZAPRINE HCL 10 MG PO TABS: 10.0000 mg | ORAL_TABLET | Freq: Three times a day (TID) | ORAL | 0 refills | Status: DC | PRN

## 2017-06-10 NOTE — Patient Instructions (Addendum)
Please call [facility] Department to schedule an appointment for IUD removal. [address]  [place] [address] [phone] Please complete the [employer] financial assistatnce packet and turn in. Please take Naproxen as ordered, and flexeril as need for back pain   Sciatica Sciatica is pain, numbness, weakness, or tingling along your sciatic nerve. The sciatic nerve starts in the lower back and goes down the back of each leg. Sciatica happens when this nerve is pinched or has pressure put on it. Sciatica usually goes away on its own or with treatment. Sometimes, sciatica may keep coming back (recur). Follow these instructions at home: Medicines  Take over-the-counter and prescription medicines only as told by your doctor.  Do not drive or use heavy machinery while taking prescription pain medicine. Managing pain  If directed, put ice on the affected area. ? Put ice in a plastic bag. ? Place a towel between your skin and the bag. ? Leave the ice on for 20 minutes, 2-3 times a day.  After icing, apply heat to the affected area before you exercise or as often as told by your doctor. Use the heat source that your doctor tells you to use, such as a moist heat pack or a heating pad. ? Place a towel between your skin and the heat source. ? Leave the heat on for 20-30 minutes. ? Remove the heat if your skin turns bright red. This is especially important if you are unable to feel pain, heat, or cold. You may have a greater risk of getting burned. Activity  Return to your normal activities as told by your doctor. Ask your doctor what activities are safe for you. ? Avoid activities that make your sciatica worse.  Take short rests during the day. Rest in a lying or standing position. This is usually better than sitting to rest. ? When you rest for a long time, do some physical activity or stretching between periods of rest. ? Avoid sitting for a long time without moving.  Get up and move around at least one time each hour.  Exercise and stretch regularly, as told by your doctor.  Do not lift anything that is heavier than 10 lb (4.5 kg) while you have symptoms of sciatica. ? Avoid lifting heavy things even when you do not have symptoms. ? Avoid lifting heavy things over and over.  When you lift objects, always lift in a way that is safe for your body. To do this, you should: ? Bend your knees. ? Keep the object close to your body. ? Avoid twisting. General instructions  Use good posture. ? Avoid leaning forward when you are sitting. ? Avoid hunching over when you are standing.  Stay at a healthy weight.  Wear comfortable shoes that support your feet. Avoid wearing high heels.  Avoid sleeping on a mattress that is too soft or too hard. You might have less pain if you sleep on a mattress that is firm enough to support your back.  Keep all follow-up visits as told by your doctor. This is important. Contact a doctor if:  You have pain that: ? Wakes you up when you are sleeping. ? Gets worse when you lie down. ? Is worse than the pain you have had in the past. ? Lasts longer than 4 weeks.  You lose weight for without trying. Get help right away if:  You cannot control when you pee (urinate) or poop (have a bowel movement).  You have  weakness in any of these areas and it gets worse. ? Lower back. ? Lower belly (pelvis). ? Butt (buttocks). ? Legs.  You have redness or swelling of your back.  You have a burning feeling when you pee. This information is not intended to replace advice given to you by your health care provider. Make sure you discuss any questions you have with your health care provider. Document Released: 01/16/2008 Document Revised: 09/14/2015 Document Reviewed: 12/16/2014 Elsevier Interactive Patient Education  2018 [employer].   Healthy Eating to Prevent Digestive Disorders The digestive system starts at the mouth and  goes all the way down to the rectum. Along the way, your digestive system breaks down the food you eat so you can absorb its nutrients and use them for energy. Digestive disorders can cause gas, bloating, pain, heartburn, and other symptoms. They can prevent your digestive system from doing its job. Healthy eating and a healthy lifestyle can help you avoid many common digestive disorders. What nutrition changes can be made? Start by eating a balanced diet. Eat healthy foods from all the major food groups. These include carbohydrates, fats, and proteins. Other changes you can make include to:  Eat enough fiber. Fiber is a healthy carbohydrate that cleans out your digestive system. Fiber absorbs water and helps you have regular bowel movements. Fiber comes from plants. To get enough fiber in your diet, eat 4-5 servings of fruits, vegetables, and legumes every day. Include beans and whole grains. Most people should get 20?35 grams of fiber each day.  Drink enough water to keep your urine clear or pale yellow. Water helps your body digest food. It can also help prevent constipation.  Avoid fatty proteins. Full-fat dairy products and fatty meats are hard to digest. Fats you want to avoid are those that get solid at room temperature (saturated fats). Instead of eating these kinds of fats, eat plant-based unsaturated fats found in olives, canola, corn, avocado, and nuts.  If you have trouble with gas, belching, or flatulence, avoid gas-producing foods. These include beans, carbonated beverages, cabbage, cauliflower, and broccoli. If you are lactose intolerant, avoid dairy products or choose lactose-free dairy products.  If you have frequent heartburn, stay away from alcohol, caffeine, fatty foods, chocolate, and peppermint. Avoid lying down within two hours of eating a full meal. Overeating and lying down too soon after a meal can cause heartburn.  Add probiotics to your diet. Healthy digestion depends on  having the right balance of good bacteria in your colon. Probiotics can help restore the balance of good bacteria in your digestive system. Probiotics are live active cultures that are found in yogurt, kefir, and cultured foods like sauerkraut and miso. You can also add good bacteria with probiotic supplements.  Make sure to chew your food slowly and completely.  Instead of eating three large meals each day, eat three small meals with three small snacks.  What other changes can I make? You can help your digestive system stay healthy by making these lifestyle changes:  Stay active and exercise every day.  Maintain a healthy weight.  Eat on a regular schedule.  Avoid tight-fitting clothes. They can restrict digestion.  If you have frequent heartburn, raise the head of your bed 2-3 inches (5-7.5 cm).  Do not use any tobacco products, such as cigarettes, chewing tobacco, and e-cigarettes. If you need help quitting, ask your health care provider.  Limit alcohol intake to no more than 1 drink a day for nonpregnant women and  2 drinks a day for men. One drink equals 12 oz of beer, 5 oz of wine, or 1 oz of hard liquor.  Avoid stress. Find ways to reduce stress, such as meditation, exercise, or taking time for activities that relax you.  Why should I make these changes? Making these changes will help your digestive system function at its best. A healthy digestive system can help you avoid or improve your management of digestive disorders such as:  Bloating, gas, and flatulence.  Heartburn.  Gastroesophageal reflux disease (GERD).  Peptic ulcer disease.  Hemorrhoids.  Diverticulitis.  Constipation.  Diarrhea.  Gall stones  Irritable bowel syndrome.  Malnutrition.  Fatty liver disease.  What can happen if changes are not made? Not making these changes could put you at risk for many conditions caused by a poor diet or an unhealthy weight, such as heart disease, stroke and  diabetes. Where can I get more information? Learn more about healthy eating and digestive disorders by visiting these websites:  Academy of Nutrition and Dietetics: SplashPops.ca  Centers for Disease Control and Prevention: TanClothes.com.cy  U.S. Department of Health and Human Services: CosmeticsCritic.si.pdf  Summary  A heathy diet can help prevent many digestive disorders.  Eat a balanced diet consisting of fiber, unsaturated fats, lean protein, fruits, and vegetables.  Eat three small meals with three small snacks per day.  Drink plenty of water every day.  Get plenty of exercise and maintain a healthy weight. This information is not intended to replace advice given to you by your health care provider. Make sure you discuss any questions you have with your health care provider. Document Released: 05/05/2015 Document Revised: 09/14/2015 Document Reviewed: 12/20/2015 Elsevier Interactive Patient Education  2018 Elsevier Inc.  Back Pain, Adult Back pain is very common. The pain often gets better over time. The cause of back pain is usually not dangerous. Most people can learn to manage their back pain on their own. Follow these instructions at home: Watch your back pain for any changes. The following actions may help to lessen any pain you are feeling:  Stay active. Start with short walks on flat ground if you can. Try to walk farther each day.  Exercise regularly as told by your doctor. Exercise helps your back heal faster. It also helps avoid future injury by keeping your muscles strong and flexible.  Do not sit, drive, or stand in one place for more than 30 minutes.  Do not stay in bed. Resting more than 1-2 days can slow down your recovery.  Be careful when you bend or lift an object. Use good form when lifting: ? Bend at your  knees. ? Keep the object close to your body. ? Do not twist.  Sleep on a firm mattress. Lie on your side, and bend your knees. If you lie on your back, put a pillow under your knees.  Take medicines only as told by your doctor.  Put ice on the injured area. ? Put ice in a plastic bag. ? Place a towel between your skin and the bag. ? Leave the ice on for 20 minutes, 2-3 times a day for the first 2-3 days. After that, you can switch between ice and heat packs.  Avoid feeling anxious or stressed. Find good ways to deal with stress, such as exercise.  Maintain a healthy weight. Extra weight puts stress on your back.  Contact a doctor if:  You have pain that does not go away with rest or medicine.  You have worsening pain that goes down into your legs or buttocks.  You have pain that does not get better in one week.  You have pain at night.  You lose weight.  You have a fever or chills. Get help right away if:  You cannot control when you poop (bowel movement) or pee (urinate).  Your arms or legs feel weak.  Your arms or legs lose feeling (numbness).  You feel sick to your stomach (nauseous) or throw up (vomit).  You have belly (abdominal) pain.  You feel like you may pass out (faint). This information is not intended to replace advice given to you by your health care provider. Make sure you discuss any questions you have with your health care provider. Document Released: 09/25/2007 Document Revised: 09/14/2015 Document Reviewed: 08/10/2013 Elsevier Interactive Patient Education  [employer]2018 Elsevier Inc.

## 2017-06-10 NOTE — Progress Notes (Signed)
Patient ID: Donna Bond, female    DOB: 11-06-1980, [age] y.o.   MRN: [id number]  PCP: System, Pcp Not In  Chief Complaint  Patient presents with  . Back Pain    Subjective:  HPIKem Vear Bond is a 37 y.o. female presents to establish care and evaluation of chronic back pain. Medical problems significant for obesity and chronic back pain. Donna Bond was seen at another practice for acute on chronic back pain with sciatica 05/17/2017 with prednisone taper, gabapentin , and cyclobenzaprine. Today she reports some improvement of pain, however she continues to experience pain symptoms with prolonged standing and sitting. She is uninsured and is unable to seek specialty evaluation of back pain symptoms. [name] also requests information as to where to have her IUD removed. It has been in place for 5 years. No recent gynecological exam or PAP. Social History   Socioeconomic History  . Marital status: Divorced    Spouse name: Not on file  . Number of children: Not on file  . Years of education: Not on file  . Highest education level: Not on file  Social Needs  . Financial resource strain: Not on file  . Food insecurity - worry: Not on file  . Food insecurity - inability: Not on file  . Transportation needs - medical: Not on file  . Transportation needs - non-medical: Not on file  Occupational History  . Not on file  Tobacco Use  . Smoking status: Never Smoker  . Smokeless tobacco: Never Used  Substance and Sexual Activity  . Alcohol use: Yes    Comment: occasional  . Drug use: No  . Sexual activity: Not on file  Other Topics Concern  . Not on file  Social History Narrative  . Not on file    Family History  Problem Relation Age of Onset  . Hypercalcemia Mother   . Hypertension Mother and Emelia LoronGrandfather    . Heart attack Mother   grandmother breast cancer OSA -Grandparents and Uncle  Review of Systems  Constitutional: Negative.   Respiratory: Negative.   Cardiovascular: Negative.    Endocrine: Negative.   Genitourinary: Negative.   Musculoskeletal: Positive for back pain and gait problem.  Psychiatric/Behavioral: Negative.    No Known Allergies  Prior to Admission medications   Medication Sig Start Date End Date Taking? Authorizing Provider  acetaminophen (TYLENOL) 500 MG tablet Take 500 mg by mouth every 6 (six) hours as needed.   Yes [provider]  gabapentin (NEURONTIN) 300 MG capsule Take 1 capsule (300 mg total) by mouth 3 (three) times daily. 05/17/17  Yes Bing NeighborsHarris, Dorraine Ellender S, FNP  ibuprofen (ADVIL,MOTRIN) 200 MG tablet Take 200 mg by mouth every 6 (six) hours as needed.   Yes [provider]  amoxicillin-clavulanate (AUGMENTIN) 875-125 MG tablet Take 2 tablets by mouth 2 (two) times daily. Patient not taking: Reported on 05/17/2017 10/03/16   [name], [name], NP  cyclobenzaprine (FLEXERIL) 10 MG tablet Take 1 tablet (10 mg total) by mouth 3 (three) times daily as needed for muscle spasms. Patient not taking: Reported on 06/10/2017 05/17/17   Bing NeighborsHarris, [name], FNP  fluconazole (DIFLUCAN) 200 MG tablet Take one tablet today, wait 3 days, take the second tablet Patient not taking: Reported on 05/17/2017 10/03/16   [name], [name], NP  predniSONE (DELTASONE) 20 MG tablet Take 3 PO QAM x3days, 2 PO QAM x3days, 1 PO QAM x3days Patient not taking: Reported on 06/10/2017 05/17/17   Bing NeighborsHarris, [name], FNP    Past  Medical, Surgical Family and Social History reviewed and updated.    Objective:   Today's Vitals   06/10/17 0926  BP: 138/90  Pulse: 78  Resp: 18  Temp: 98 F (36.7 C)  TempSrc: Oral  SpO2: 100%  Weight: 295 lb (133.8 kg)  Height: 5\' 1"  (1.549 m)    Wt Readings from Last 3 Encounters:  06/10/17 295 lb (133.8 kg)  05/17/17 288 lb 6.4 oz (130.8 kg)  11/13/16 300 lb (136.1 kg)    Physical Exam  Constitutional: She is oriented to person, place, and time. She appears well-developed and well-nourished.  Eyes: Conjunctivae and  EOM are normal. Pupils are equal, round, and reactive to light.  Cardiovascular: Normal rate, regular rhythm, normal heart sounds and intact distal pulses.  Pulmonary/Chest: Effort normal and breath sounds normal.  Musculoskeletal:       Lumbar back: She exhibits tenderness and pain.  Neurological: She is alert and oriented to person, place, and time.  Psychiatric: She has a normal mood and affect. Her behavior is normal. Judgment and thought content normal.   Assessment & Plan:  1. Chronic bilateral low back pain with bilateral sciatica, this is been an ongoing chronic problem.  Patient has not sustained any prior injury.  Will obtain a lumbar spine complete x-ray to evaluate for degenerative disc disease.  Will administer Toradol 30 mg IM today.- ketorolac (TORADOL) 30 MG/ML injection 30 mg  2. Elevated BP without diagnosis of hypertension, will hold off on starting antihypertensive therapy today.  We will have patient return in 1 month for a hypertension evaluation.  Patient has the following risk factors: Morbid obesity, sedentary lifestyle, family history of hypertension.  3. Screening for diabetes mellitus, (Hb A1C) 5.8 prediabetes range.  Will recheck in 6 months.  Patient encouraged to engage in routine physical activity, reduce intake of simple carbohydrates, or sugary drinks.   4. Screening for cardiovascular condition, will obtain a 12-lead EKG today.  Also will check a fasting lipid panel and CBC.  5. Screening for thyroid disorder- Thyroid Panel With TSH   Orders Placed This Encounter  Procedures  . DG Lumbar Spine Complete  . Comprehensive metabolic panel  . CBC with Differential  . Thyroid Panel With TSH  . Lipid panel  . POCT glycosylated hemoglobin (Hb A1C)  . Urinalysis Dipstick  . POCT urinalysis dip (device)  . EKG 12-Lead   Meds ordered this encounter  Medications  . naproxen (NAPROSYN) 500 MG tablet    Sig: Take 1 tablet (500 mg total) by mouth 2 (two) times  daily with a meal.    Dispense:  30 tablet    Refill:  0    Order Specific Question:   Supervising Provider    Answer:   Quentin Angst [id number]  . cyclobenzaprine (FLEXERIL) 10 MG tablet    Sig: Take 1 tablet (10 mg total) by mouth 3 (three) times daily as needed for muscle spasms.    Dispense:  60 tablet    Refill:  0    Order Specific Question:   Supervising Provider    Answer:   Quentin Angst [id number]  . ketorolac (TORADOL) 30 MG/ML injection 30 mg     RTC:  1 month blood pressure evaluation    Godfrey Pick. Tiburcio Pea, MSN, FNP-C [facility]  [address] [place], [place] [address] [phone]

## 2017-06-11 LAB — THYROID PANEL WITH TSH
Free Thyroxine Index: 1.8 (ref 1.2–4.9)
T3 Uptake Ratio: 21 % — ABNORMAL LOW (ref 24–39)
T4 TOTAL: 8.4 ug/dL (ref 4.5–12.0)
TSH: 1.36 u[IU]/mL (ref 0.450–4.500)

## 2017-06-11 LAB — CBC WITH DIFFERENTIAL/PLATELET
BASOS ABS: 0 10*3/uL (ref 0.0–0.2)
Basos: 0 %
EOS (ABSOLUTE): 0.2 10*3/uL (ref 0.0–0.4)
Eos: 1 %
HEMOGLOBIN: 11.9 g/dL (ref 11.1–15.9)
Hematocrit: 37.9 % (ref 34.0–46.6)
Immature Grans (Abs): 0.1 10*3/uL (ref 0.0–0.1)
Immature Granulocytes: 1 %
LYMPHS ABS: 2.7 10*3/uL (ref 0.7–3.1)
Lymphs: 26 %
MCH: 21.8 pg — ABNORMAL LOW (ref 26.6–33.0)
MCHC: 31.4 g/dL — AB (ref 31.5–35.7)
MCV: 70 fL — ABNORMAL LOW (ref 79–97)
MONOCYTES: 4 %
Monocytes Absolute: 0.5 10*3/uL (ref 0.1–0.9)
Neutrophils Absolute: 7.2 10*3/uL — ABNORMAL HIGH (ref 1.4–7.0)
Neutrophils: 68 %
PLATELETS: 314 10*3/uL (ref 150–379)
RBC: 5.45 x10E6/uL — AB (ref 3.77–5.28)
RDW: 18.7 % — ABNORMAL HIGH (ref 12.3–15.4)
WBC: 10.7 10*3/uL (ref 3.4–10.8)

## 2017-06-11 LAB — COMPREHENSIVE METABOLIC PANEL
ALBUMIN: 3.9 g/dL (ref 3.5–5.5)
ALK PHOS: 100 IU/L (ref 39–117)
ALT: 13 IU/L (ref 0–32)
AST: 21 IU/L (ref 0–40)
Albumin/Globulin Ratio: 1.1 — ABNORMAL LOW (ref 1.2–2.2)
BILIRUBIN TOTAL: 0.4 mg/dL (ref 0.0–1.2)
BUN / CREAT RATIO: 12 (ref 9–23)
BUN: 9 mg/dL (ref 6–20)
CHLORIDE: 102 mmol/L (ref 96–106)
CO2: 23 mmol/L (ref 20–29)
Calcium: 9.1 mg/dL (ref 8.7–10.2)
Creatinine, Ser: 0.77 mg/dL (ref 0.57–1.00)
GFR calc Af Amer: 115 mL/min/{1.73_m2} (ref >59)
GFR calc non Af Amer: 100 mL/min/{1.73_m2} (ref >59)
GLUCOSE: 86 mg/dL (ref 65–99)
Globulin, Total: 3.5 g/dL (ref 1.5–4.5)
Potassium: 4.2 mmol/L (ref 3.5–5.2)
Sodium: 140 mmol/L (ref 134–144)
Total Protein: 7.4 g/dL (ref 6.0–8.5)

## 2017-06-11 LAB — LIPID PANEL
CHOL/HDL RATIO: 3.8 ratio (ref 0.0–4.4)
Cholesterol, Total: 163 mg/dL (ref 100–199)
HDL: 43 mg/dL (ref >39)
LDL CALC: 100 mg/dL — AB (ref 0–99)
Triglycerides: 100 mg/dL (ref 0–149)
VLDL Cholesterol Cal: 20 mg/dL (ref 5–40)

## 2017-06-12 ENCOUNTER — Encounter: Payer: Self-pay | Admitting: Family Medicine

## 2017-07-08 ENCOUNTER — Ambulatory Visit: Payer: BLUE CROSS/BLUE SHIELD | Admitting: Family Medicine

## 2017-07-15 ENCOUNTER — Encounter: Payer: Self-pay | Admitting: Family Medicine

## 2017-07-15 ENCOUNTER — Ambulatory Visit (INDEPENDENT_AMBULATORY_CARE_PROVIDER_SITE_OTHER): Payer: BLUE CROSS/BLUE SHIELD | Admitting: Family Medicine

## 2017-07-15 VITALS — BP 126/84 | HR 82 | Temp 98.0°F | Resp 14 | Ht 61.0 in | Wt 294.0 lb

## 2017-07-15 DIAGNOSIS — Z01419 Encounter for gynecological examination (general) (routine) without abnormal findings: Secondary | ICD-10-CM

## 2017-07-15 LAB — POCT URINALYSIS DIP (DEVICE)
Bilirubin Urine: NEGATIVE
GLUCOSE, UA: NEGATIVE mg/dL
KETONES UR: NEGATIVE mg/dL
LEUKOCYTES UA: NEGATIVE
Nitrite: NEGATIVE
PROTEIN: 30 mg/dL — AB
Urobilinogen, UA: 0.2 mg/dL (ref 0.0–1.0)
pH: 5.5 (ref 5.0–8.0)

## 2017-07-15 NOTE — Progress Notes (Signed)
Patient ID: Donna FlashKem Claggett, female    DOB: [birth year], 37 y.o.   MRN: [id number]  PCP: Bing NeighborsHarris, Tavia Stave S, FNP  Chief Complaint  Patient presents with  . Gynecologic Exam    Subjective:  HPI Donna Bond is a 37 y.o. female presents for routine gynecological exam. Currently sexually inactive . She has an IUD in place that was to be removed to in September 2018. At present she is experiencing irregular periods and spotting with IUD. Reports a nonspecific family history  Paternal great grandmother-"cancer with woman issue" and her maternal grandmother has breast cancer, although in remission since 2013. Denies dysuria, pelvic pain, vaginal discharge, or irritation. She has no other complaints today. Social History   Socioeconomic History  . Marital status: Divorced    Spouse name: Not on file  . Number of children: Not on file  . Years of education: Not on file  . Highest education level: Not on file  Occupational History  . Not on file  Social Needs  . Financial resource strain: Not on file  . Food insecurity:    Worry: Not on file    Inability: Not on file  . Transportation needs:    Medical: Not on file    Non-medical: Not on file  Tobacco Use  . Smoking status: Never Smoker  . Smokeless tobacco: Never Used  Substance and Sexual Activity  . Alcohol use: Yes    Comment: occasional  . Drug use: No  . Sexual activity: Not on file  Lifestyle  . Physical activity:    Days per week: Not on file    Minutes per session: Not on file  . Stress: Not on file  Relationships  . Social connections:    Talks on phone: Not on file    Gets together: Not on file    Attends religious service: Not on file    Active member of club or organization: Not on file    Attends meetings of clubs or organizations: Not on file    Relationship status: Not on file  . Intimate partner violence:    Fear of current or ex partner: Not on file    Emotionally abused: Not on file    Physically abused:  Not on file    Forced sexual activity: Not on file  Other Topics Concern  . Not on file  Social History Narrative  . Not on file    Family History  Problem Relation Age of Onset  . Hypercalcemia Mother   . Hypertension Mother   . Heart attack Mother      Review of Systems  There are no active problems to display for this patient.   No Known Allergies  Prior to Admission medications   Medication Sig Start Date End Date Taking? Authorizing Provider  acetaminophen (TYLENOL) 500 MG tablet Take 500 mg by mouth every 6 (six) hours as needed.    [provider]  cyclobenzaprine (FLEXERIL) 10 MG tablet Take 1 tablet (10 mg total) by mouth 3 (three) times daily as needed for muscle spasms. Patient not taking: Reported on 07/15/2017 06/10/17   Bing NeighborsHarris, Sherley Leser S, FNP  gabapentin (NEURONTIN) 300 MG capsule Take 1 capsule (300 mg total) by mouth 3 (three) times daily. Patient not taking: Reported on 07/15/2017 05/17/17   Bing NeighborsHarris, Claritza July S, FNP  naproxen (NAPROSYN) 500 MG tablet Take 1 tablet (500 mg total) by mouth 2 (two) times daily with a meal. Patient not taking: Reported on 07/15/2017 06/10/17  Bing Neighbors, FNP    Past Medical, Surgical Family and Social History reviewed and updated.    Objective:   Today's Vitals   07/15/17 0907  BP: 126/84  Pulse: 82  Resp: 14  Temp: 98 F (36.7 C)  TempSrc: Oral  SpO2: 100%  Weight: 294 lb (133.4 kg)  Height: 5\' 1"  (1.549 m)    Wt Readings from Last 3 Encounters:  07/15/17 294 lb (133.4 kg)  06/10/17 295 lb (133.8 kg)  05/17/17 288 lb 6.4 oz (130.8 kg)   Physical Exam  Vital signs within expected parameters. Breasts are symmetric without cutaneous changes, nipple inversion or discharge. No masses or tenderness, and no axillary lymphadenopathy. Normal female external genitalia without lesion. No inguinal lymphadenopathy. Vaginal mucosa is pink and moist without lesions. Cervix is closed without discharge, not friable.  IUD strings non-visible. Pap smear obtained. No cervical motion tenderness, adnexal fullness or tenderness. Assessment & Plan:  1. Pap smear, as part of routine gynecological examination,  Pap IG, CT/NG w/ reflex HPV when ASC-U.  You will be notified regarding results of specimen.  RTC: PRN  Godfrey Pick. Tiburcio Pea, MSN, FNP-C [facility]  [address] [place], [place] [address] [phone]

## 2017-07-17 LAB — PAP IG, CT-NG, RFX HPV ASCU
Chlamydia, Nuc. Acid Amp: NEGATIVE
Gonococcus by Nucleic Acid Amp: NEGATIVE
PAP Smear Comment: 0

## 2017-07-18 ENCOUNTER — Encounter: Payer: Self-pay | Admitting: Family Medicine

## 2017-07-18 NOTE — Progress Notes (Signed)
Mail lab letter  

## 2017-08-18 ENCOUNTER — Encounter: Payer: Self-pay | Admitting: Family Medicine

## 2017-08-18 ENCOUNTER — Ambulatory Visit (INDEPENDENT_AMBULATORY_CARE_PROVIDER_SITE_OTHER): Payer: Self-pay | Admitting: Family Medicine

## 2017-08-18 VITALS — BP 122/80 | HR 90 | Temp 97.5°F | Ht 61.0 in | Wt 296.0 lb

## 2017-08-18 DIAGNOSIS — Z6841 Body Mass Index (BMI) 40.0 and over, adult: Secondary | ICD-10-CM

## 2017-08-18 DIAGNOSIS — G8929 Other chronic pain: Secondary | ICD-10-CM

## 2017-08-18 DIAGNOSIS — M545 Low back pain, unspecified: Secondary | ICD-10-CM | POA: Insufficient documentation

## 2017-08-18 NOTE — Patient Instructions (Addendum)
Weight loss goal is 10 % of body weight over 6 months. Exactly 29.6.  Refrain from refined sugars (cookies, cakes, pies, chips, fries, fruit, pasta, soda or juice). Recommend a lowfat, low carbohydrate diet divided over 5-6 small meals, increase water intake to 6-8 glasses, and 150 minutes per week of cardiovascular exercise.       Exercising to Lose Weight Exercising can help you to lose weight. In order to lose weight through exercise, you need to do vigorous-intensity exercise. You can tell that you are exercising with vigorous intensity if you are breathing very hard and fast and cannot hold a conversation while exercising. Moderate-intensity exercise helps to maintain your current weight. You can tell that you are exercising at a moderate level if you have a higher heart rate and faster breathing, but you are still able to hold a conversation. How often should I exercise? Choose an activity that you enjoy and set realistic goals. Your health care provider can help you to make an activity plan that works for you. Exercise regularly as directed by your health care provider. This may include:  Doing resistance training twice each week, such as: ? Push-ups. ? Sit-ups. ? Lifting weights. ? Using resistance bands.  Doing a given intensity of exercise for a given amount of time. Choose from these options: ? 150 minutes of moderate-intensity exercise every week. ? 75 minutes of vigorous-intensity exercise every week. ? A mix of moderate-intensity and vigorous-intensity exercise every week.  Children, pregnant women, people who are out of shape, people who are overweight, and older adults may need to consult a health care provider for individual recommendations. If you have any sort of medical condition, be sure to consult your health care provider before starting a new exercise program. What are some activities that can help me to lose weight?  Walking at a rate of at least 4.5 miles an  hour.  Jogging or running at a rate of 5 miles per hour.  Biking at a rate of at least 10 miles per hour.  Lap swimming.  Roller-skating or in-line skating.  Cross-country skiing.  Vigorous competitive sports, such as football, basketball, and soccer.  Jumping rope.  Aerobic dancing. How can I be more active in my day-to-day activities?  Use the stairs instead of the elevator.  Take a walk during your lunch break.  If you drive, park your car farther away from work or school.  If you take public transportation, get off one stop early and walk the rest of the way.  Make all of your phone calls while standing up and walking around.  Get up, stretch, and walk around every 30 minutes throughout the day. What guidelines should I follow while exercising?  Do not exercise so much that you hurt yourself, feel dizzy, or get very short of breath.  Consult your health care provider prior to starting a new exercise program.  Wear comfortable clothes and shoes with good support.  Drink plenty of water while you exercise to prevent dehydration or heat stroke. Body water is lost during exercise and must be replaced.  Work out until you breathe faster and your heart beats faster. This information is not intended to replace advice given to you by your health care provider. Make sure you discuss any questions you have with your health care provider. Document Released: 05/11/2010 Document Revised: 09/14/2015 Document Reviewed: 09/09/2013 Elsevier Interactive Patient Education  2018 [employer]. Diet for Metabolic Syndrome Metabolic syndrome is a disorder that  includes at least three of these conditions:  Abdominal obesity.  Too much sugar in your blood.  High blood pressure.  Higher than normal amount of fat (lipids) in your blood.  Lower than normal level of "good" cholesterol (HDL).  Following a healthy diet can help to keep metabolic syndrome under control. It can also help  to prevent the development of conditions that are associated with metabolic syndrome, such as diabetes, heart disease, and stroke. Along with exercise, a healthy diet:  Helps to improve the way that the body uses insulin.  Promotes weight loss. A common goal for people with this condition is to lose at least 7 to 10 percent of their starting weight.  What do I need to know about this diet?  Use the glycemic index (GI) to plan your meals. The index tells you how quickly a food will raise your blood sugar. Choose foods that have low GI values. These foods take a longer time to raise blood sugar.  Keep track of how many calories you take in. Eating the right amount of calories will help your achieve a healthy weight.  You may want to follow a Mediterranean diet. This diet includes lots of vegetables, lean meats or fish, whole grains, fruits, and healthy oils and fats. What foods can I eat? Grains Stone-ground whole wheat. Pumpernickel bread. Whole-grain bread, crackers, tortillas, cereal, and pasta. Unsweetened oatmeal.Bulgur.Barley.Quinoa.Brown rice or wild rice. Vegetables Lettuce. Spinach. Peas. Beets. Cauliflower. Cabbage. Broccoli. Carrots. Tomatoes. Squash. Eggplant. Herbs. Peppers. Onions. Cucumbers. Brussels sprouts. Sweet potatoes. Yams. Beans. Lentils. Fruits Berries. Apples. Oranges. Grapes. Mango. Pomegranate. Kiwi. Cherries. Meats and Other Protein Sources Seafood and shellfish. Lean meats.Poultry. Tofu. Dairy Low-fat or fat-free dairy products, such as milk, yogurt, and cheese. Beverages Water. Low-fat milk. Milk alternatives, like soy milk or almond milk. Real fruit juice. Condiments Low-sugar or sugar-free ketchup, barbecue sauce, and mayonnaise. Mustard. Relish. Fats and Oils Avocado. Canola or olive oil. Nuts and nut butters.Seeds. The items listed above may not be a complete list of recommended foods or beverages. Contact your dietitian for more options. What  foods are not recommended? Red meat. Palm oil and coconut oil. Processed foods. Fried foods. Alcohol. Sweetened drinks, such as iced tea and soda. Sweets. Salty foods. The items listed above may not be a complete list of foods and beverages to avoid. Contact your dietitian for more information. This information is not intended to replace advice given to you by your health care provider. Make sure you discuss any questions you have with your health care provider. Document Released: 08/23/2014 Document Revised: 08/18/2015 Document Reviewed: 04/20/2014 Elsevier Interactive Patient Education  2018 [employer].
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
## 2017-08-28 ENCOUNTER — Ambulatory Visit: Payer: Self-pay | Admitting: Family Medicine

## 2018-02-17 ENCOUNTER — Ambulatory Visit: Payer: Self-pay | Admitting: Family Medicine

## 2018-05-15 ENCOUNTER — Ambulatory Visit (HOSPITAL_COMMUNITY)
Admission: EM | Admit: 2018-05-15 | Discharge: 2018-05-15 | Disposition: A | Discharge status: Home / Self Care | Payer: No Typology Code available for payment source | Attending: Internal Medicine | Admitting: Internal Medicine

## 2018-05-15 ENCOUNTER — Encounter (HOSPITAL_COMMUNITY): Payer: Self-pay | Admitting: Emergency Medicine

## 2018-05-15 DIAGNOSIS — J00 Acute nasopharyngitis [common cold]: Secondary | ICD-10-CM | POA: Diagnosis not present

## 2018-05-15 MED ORDER — FLUTICASONE PROPIONATE 50 MCG/ACT NA SUSP: 1.0000 | Freq: Every day | NASAL | 2 refills | Status: DC

## 2018-05-15 MED ORDER — BENZONATATE 100 MG PO CAPS: 100.0000 mg | ORAL_CAPSULE | Freq: Three times a day (TID) | ORAL | 0 refills | Status: DC

## 2018-05-15 NOTE — ED Triage Notes (Signed)
Pt c/o cough runny nose congestion fatigue x3 days.

## 2018-05-15 NOTE — ED Provider Notes (Addendum)
[facility]    CSN: [id number] Arrival date & time: 05/15/18  1007     History   Chief Complaint Chief Complaint  Patient presents with  . URI    HPI Donna Bond is a 38 y.o. female with no past medical history comes to the urgent care with complaints of runny nose, sore throat, cough of a few days duration.  Patient says symptoms started insidiously and got progressively worse.  No fever or chills.  No vomiting.  Patient denies any chest pain or chest pressure.  Patient has been trying over-the-counter medications with some relief of the cough.  HPI  Past Medical History:  Diagnosis Date  . Morbid obesity [facility](HCC)     Patient Active Problem List   Diagnosis Date Noted  . Chronic low back pain without sciatica 08/18/2017  . Morbid obesity with BMI of 50.0-59.9, adult (HCC) 08/18/2017    Past Surgical History:  Procedure Laterality Date  . CESAREAN SECTION      OB History   No obstetric history on file.      Home Medications    Prior to Admission medications   Medication Sig Start Date End Date Taking? Authorizing Provider  cyclobenzaprine (FLEXERIL) 10 MG tablet Take 1 tablet (10 mg total) by mouth 3 (three) times daily as needed for muscle spasms. Patient not taking: Reported on 07/15/2017 06/10/17   Bing NeighborsHarris, Kimberly S, FNP  gabapentin (NEURONTIN) 300 MG capsule Take 1 capsule (300 mg total) by mouth 3 (three) times daily. Patient not taking: Reported on 07/15/2017 05/17/17   Bing NeighborsHarris, Kimberly S, FNP    Family History Family History  Problem Relation Age of Onset  . Hypercalcemia Mother   . Hypertension Mother   . Heart attack Mother     Social History Social History   Tobacco Use  . Smoking status: Never Smoker  . Smokeless tobacco: Never Used  Substance Use Topics  . Alcohol use: Yes    Comment: occasional  . Drug use: No     Allergies   Patient has no known allergies.   Review of Systems Review of Systems  Constitutional: Negative  for activity change and appetite change.  HENT: Positive for congestion, postnasal drip and sore throat. Negative for hearing loss, mouth sores, sinus pressure and tinnitus.   Eyes: Negative for pain, discharge and itching.  Respiratory: Positive for cough. Negative for chest tightness, shortness of breath and wheezing.   Cardiovascular: Negative for chest pain.  Musculoskeletal: Negative for arthralgias, joint swelling and myalgias.     Physical Exam Triage Vital Signs ED Triage Vitals  Enc Vitals Group     BP 05/15/18 1050 123/75     Pulse Rate 05/15/18 1050 74     Resp 05/15/18 1050 18     Temp 05/15/18 1050 97.7 F (36.5 C)     Temp Source 05/15/18 1050 Temporal     SpO2 05/15/18 1050 100 %     Weight --      Height --      Head Circumference --      Peak Flow --      Pain Score 05/15/18 1051 0     Pain Loc --      Pain Edu? --      Excl. in GC? --    No data found.  Updated Vital Signs BP 123/75   Pulse 74   Temp 97.7 F (36.5 C) (Temporal)   Resp 18   SpO2 100%  Visual Acuity Right Eye Distance:   Left Eye Distance:   Bilateral Distance:    Right Eye Near:   Left Eye Near:    Bilateral Near:     Physical Exam Constitutional:      Appearance: Normal appearance. She is not ill-appearing.  HENT:     Right Ear: Tympanic membrane normal.     Left Ear: Tympanic membrane normal.     Nose: Congestion and rhinorrhea present.     Mouth/Throat:     Mouth: Mucous membranes are moist.     Pharynx: Posterior oropharyngeal erythema present.  Eyes:     Extraocular Movements: Extraocular movements intact.     Conjunctiva/sclera: Conjunctivae normal.     Pupils: Pupils are equal, round, and reactive to light.  Cardiovascular:     Rate and Rhythm: Normal rate and regular rhythm.     Heart sounds: No murmur. No gallop.   Pulmonary:     Effort: Pulmonary effort is normal.  Lymphadenopathy:     Cervical: No cervical adenopathy.  Neurological:     Mental Status:  She is alert.      UC Treatments / Results  Labs (all labs ordered are listed, but only abnormal results are displayed) Labs Reviewed - No data to display  EKG None  Radiology No results found.  Procedures Procedures (including critical care time)  Medications Ordered in UC Medications - No data to display  Initial Impression / Assessment and Plan / UC Course  I have reviewed the triage vital signs and the nursing notes.  Pertinent labs & imaging results that were available during my care of the patient were reviewed by me and considered in my medical decision making (see chart for details).     1.  Acute viral nasopharyngitis: Flonase Tessalon Perles Tylenol/NSAID for fever/body aches Encourage oral fluid intake  Final Clinical Impressions(s) / UC Diagnoses   Final diagnoses:  None   Discharge Instructions   None    ED Prescriptions    None     Controlled Substance Prescriptions [place] Controlled Substance Registry consulted? No   Merrilee Jansky, MD 05/15/18 1150    Merrilee Jansky, MD 05/15/18 1150

## 2018-05-27 ENCOUNTER — Ambulatory Visit (INDEPENDENT_AMBULATORY_CARE_PROVIDER_SITE_OTHER): Payer: No Typology Code available for payment source

## 2018-05-27 ENCOUNTER — Encounter (HOSPITAL_COMMUNITY): Payer: Self-pay | Admitting: Emergency Medicine

## 2018-05-27 ENCOUNTER — Ambulatory Visit (HOSPITAL_COMMUNITY)
Admission: EM | Admit: 2018-05-27 | Discharge: 2018-05-27 | Disposition: A | Discharge status: Home / Self Care | Payer: No Typology Code available for payment source | Attending: Family Medicine | Admitting: Family Medicine

## 2018-05-27 ENCOUNTER — Other Ambulatory Visit: Payer: Self-pay

## 2018-05-27 DIAGNOSIS — W19XXXA Unspecified fall, initial encounter: Secondary | ICD-10-CM

## 2018-05-27 DIAGNOSIS — S93402A Sprain of unspecified ligament of left ankle, initial encounter: Secondary | ICD-10-CM | POA: Diagnosis not present

## 2018-05-27 MED ORDER — NAPROXEN 500 MG PO TABS: 500.0000 mg | ORAL_TABLET | Freq: Two times a day (BID) | ORAL | 0 refills | Status: DC
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
## 2018-05-27 NOTE — Discharge Instructions (Signed)
X-rays did not show fracture or dislocation Cam walker and crutches given Continue conservative management of rest, ice, elevation, and compression Take naproxen as needed for pain relief (may cause abdominal discomfort, ulcers, and GI bleeds avoid taking with other NSAIDs) Follow up with orthopedist for further evaluation and management Return or go to the ER if you have any new or worsening symptoms (worsening pain, swelling, redness, fever, chills, chest pain, abdominal pain, changes in bowel or bladder habits, pain radiating into lower legs, etc...)
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
## 2018-09-04 ENCOUNTER — Telehealth: Payer: No Typology Code available for payment source | Admitting: Nurse Practitioner

## 2018-09-04 DIAGNOSIS — J019 Acute sinusitis, unspecified: Secondary | ICD-10-CM

## 2018-09-04 DIAGNOSIS — B9789 Other viral agents as the cause of diseases classified elsewhere: Secondary | ICD-10-CM

## 2018-09-04 MED ORDER — FLUTICASONE PROPIONATE 50 MCG/ACT NA SUSP: 1.0000 | Freq: Every day | NASAL | 2 refills | Status: DC

## 2018-09-04 NOTE — Progress Notes (Addendum)

## 2018-11-13 ENCOUNTER — Telehealth: Payer: No Typology Code available for payment source | Admitting: Family

## 2018-11-13 DIAGNOSIS — M5440 Lumbago with sciatica, unspecified side: Secondary | ICD-10-CM | POA: Diagnosis not present

## 2018-11-13 MED ORDER — CYCLOBENZAPRINE HCL 10 MG PO TABS: 10.0000 mg | ORAL_TABLET | Freq: Three times a day (TID) | ORAL | 0 refills | Status: DC | PRN

## 2018-11-13 MED ORDER — NAPROXEN 500 MG PO TABS
500.0000 mg | ORAL_TABLET | Freq: Two times a day (BID) | ORAL | 0 refills | Status: DC
Start: 2018-11-13 — End: 2020-01-17

## 2018-11-13 NOTE — Progress Notes (Signed)
We are sorry that you are not feeling well.  Here is how we plan to help!  Based on what you have shared with me it looks like you mostly have acute back pain.  Acute back pain is defined as musculoskeletal pain that can resolve in 1-3 weeks with conservative treatment.  I have prescribed Naprosyn 500 mg twice a day non-steroid anti-inflammatory (NSAID) as well as Flexeril 10 mg every eight hours as needed which is a muscle relaxer  Some patients experience stomach irritation or in increased heartburn with anti-inflammatory drugs.  Please keep in mind that muscle relaxer's can cause fatigue and should not be taken while at work or driving.  Back pain is very common.  The pain often gets better over time.  The cause of back pain is usually not dangerous.  Most people can learn to manage their back pain on their own.  Home Care  Stay active.  Start with short walks on flat ground if you can.  Try to walk farther each day.  Do not sit, drive or stand in one place for more than 30 minutes.  Do not stay in bed.  Do not avoid exercise or work.  Activity can help your back heal faster.  Be careful when you bend or lift an object.  Bend at your knees, keep the object close to you, and do not twist.  Sleep on a firm mattress.  Lie on your side, and bend your knees.  If you lie on your back, put a pillow under your knees.  Only take medicines as told by your doctor.  Put ice on the injured area.  Put ice in a plastic bag  Place a towel between your skin and the bag  Leave the ice on for 15-20 minutes, 3-4 times a day for the first 2-3 days. 210 After that, you can switch between ice and heat packs.  Ask your doctor about back exercises or massage.  Avoid feeling anxious or stressed.  Find good ways to deal with stress, such as exercise.  Get Help Right Way If:  Your pain does not go away with rest or medicine.  Your pain does not go away in 1 week.  You have new problems.  You do not  feel well.  The pain spreads into your legs.  You cannot control when you poop (bowel movement) or pee (urinate)  You feel sick to your stomach (nauseous) or throw up (vomit)  You have belly (abdominal) pain.  You feel like you may pass out (faint).  If you develop a fever.  Make Sure you:  Understand these instructions.  Will watch your condition  Will get help right away if you are not doing well or get worse.  Your e-visit answers were reviewed by a board certified advanced clinical practitioner to complete your personal care plan.  Depending on the condition, your plan could have included both over the counter or prescription medications.  If there is a problem please reply  once you have received a response from your provider.  Your safety is important to us.  If you have drug allergies check your prescription carefully.    You can use MyChart to ask questions about today's visit, request a non-urgent call back, or ask for a work or school excuse for 24 hours related to this e-Visit. If it has been greater than 24 hours you will need to follow up with your provider, or enter a new e-Visit to address   those concerns.  You will get an e-mail in the next two days asking about your experience.  I hope that your e-visit has been valuable and will speed your recovery. Thank you for using e-visits.   Greater than 5 minutes, yet less than 10 minutes of time have been spent researching, coordinating, and implementing care for this patient today.  Thank you for the details you included in the comment boxes. Those details are very helpful in determining the best course of treatment for you and help us to provide the best care.  

## 2019-03-28 ENCOUNTER — Telehealth: Payer: No Typology Code available for payment source | Admitting: Family

## 2019-03-28 DIAGNOSIS — J069 Acute upper respiratory infection, unspecified: Secondary | ICD-10-CM | POA: Diagnosis not present

## 2019-03-28 MED ORDER — BENZONATATE 100 MG PO CAPS: 100.0000 mg | ORAL_CAPSULE | Freq: Three times a day (TID) | ORAL | 0 refills | Status: DC | PRN

## 2019-03-28 MED ORDER — FLUTICASONE PROPIONATE 50 MCG/ACT NA SUSP: 2.0000 | Freq: Every day | NASAL | 6 refills | Status: DC

## 2019-03-28 NOTE — Progress Notes (Signed)
We are sorry you are not feeling well.  Here is how we plan to help!  Based on what you have shared with me, it looks like you may have a viral upper respiratory infection.  Upper respiratory infections are caused by a large number of viruses; however, rhinovirus is the most common cause.   Symptoms vary from person to person, with common symptoms including sore throat, cough, fatigue or lack of energy and feeling of general discomfort.  A low-grade fever of up to 100.4 may present, but is often uncommon.  Symptoms vary however, and are closely related to a person's age or underlying illnesses.  The most common symptoms associated with an upper respiratory infection are nasal discharge or congestion, cough, sneezing, headache and pressure in the ears and face.  These symptoms usually persist for about 3 to 10 days, but can last up to 2 weeks.  It is important to know that upper respiratory infections do not cause serious illness or complications in most cases.    Upper respiratory infections can be transmitted from person to person, with the most common method of transmission being a person's hands.  The virus is able to live on the skin and can infect other persons for up to 2 hours after direct contact.  Also, these can be transmitted when someone coughs or sneezes; thus, it is important to cover the mouth to reduce this risk.  To keep the spread of the illness at [place], good hand hygiene is very important.  This is an infection that is most likely caused by a virus. There are no specific treatments other than to help you with the symptoms until the infection runs its course.  We are sorry you are not feeling well.  Here is how we plan to help!   For nasal congestion, you may use an oral decongestants such as Mucinex D or if you have glaucoma or high blood pressure use plain Mucinex.  Saline nasal spray or nasal drops can help and can safely be used as often as needed for congestion.  For your congestion,  I have prescribed Fluticasone nasal spray one spray in each nostril twice a day  If you do not have a history of heart disease, hypertension, diabetes or thyroid disease, prostate/bladder issues or glaucoma, you may also use Sudafed to treat nasal congestion.  It is highly recommended that you consult with a pharmacist or your primary care physician to ensure this medication is safe for you to take.     If you have a cough, you may use cough suppressants such as Delsym and Robitussin.  If you have glaucoma or high blood pressure, you can also use Coricidin HBP.   For cough I have prescribed for you A prescription cough medication called Tessalon Perles 100 mg. You may take 1-2 capsules every 8 hours as needed for cough  If you have a sore or scratchy throat, use a saltwater gargle-  to  teaspoon of salt dissolved in a 4-ounce to 8-ounce glass of warm water.  Gargle the solution for approximately 15-30 seconds and then spit.  It is important not to swallow the solution.  You can also use throat lozenges/cough drops and Chloraseptic spray to help with throat pain or discomfort.  Warm or cold liquids can also be helpful in relieving throat pain.  Given your current symptoms you also need to be tested for Covid. You can go to one of the testing sites listed below, while they are  opened (see hours). You do not need an order and will stay in your car during the test. You do need to self isolate until your results return and if positive 14 days from when your symptoms started and until you are 3 days fever free.   Testing Locations (Monday - Friday, 10 a.m. - 3 p.m.) . [place]: [facility] at [facility], [address] [place], [place]  . [place]: [address], [address] [place], [place] (entrance off [employer])  . [facility]: (Closed each Monday): Testing site relocated to the short stay covered drive at [facility]. (Use the [facility] entrance to [facility] next to [facility].)    For headache, pain or general discomfort, you can use Ibuprofen or Tylenol as directed.   Some authorities believe that zinc sprays or the use of Echinacea may shorten the course of your symptoms.   HOME CARE . Only take medications as instructed by your medical team. . Be sure to drink plenty of fluids. Water is fine as well as fruit juices, sodas and electrolyte beverages. You may want to stay away from caffeine or alcohol. If you are nauseated, try taking small sips of liquids. How do you know if you are getting enough fluid? Your urine should be a pale yellow or almost colorless. . Get rest. . Taking a steamy shower or using a humidifier may help nasal congestion and ease sore throat pain. You can place a towel over your head and breathe in the steam from hot water coming from a faucet. . Using a saline nasal spray works much the same way. . Cough drops, hard candies and sore throat lozenges may ease your cough. . Avoid close contacts especially the very young and the elderly . Cover your mouth if you cough or sneeze . Always remember to wash your hands.   GET HELP RIGHT AWAY IF: . You develop worsening fever. . If your symptoms do not improve within 10 days . You develop yellow or green discharge from your nose over 3 days. . You have coughing fits . You develop a severe head ache or visual changes. . You develop shortness of breath, difficulty breathing or start having chest pain . Your symptoms persist after you have completed your treatment plan  MAKE SURE YOU   Understand these instructions.  Will watch your condition.  Will get help right away if you are not doing well or get worse.  Your e-visit answers were reviewed by a board certified advanced clinical practitioner to complete your personal care plan. Depending upon the condition, your plan could have included both over the counter or  prescription medications. Please review your pharmacy choice. If there is a problem, you may call our nursing hot line at and have the prescription routed to another pharmacy. Your safety is important to [name]. If you have drug allergies check your prescription carefully.   You can use MyChart to ask questions about today's visit, request a non-urgent call back, or ask for a work or school excuse for 24 hours related to this e-Visit. If it has been greater than 24 hours you will need to follow up with your provider, or enter a new e-Visit to address those concerns. You will get an e-mail in the next two days asking about your experience.  I hope that your e-visit has been valuable and will speed your recovery. Thank you for using e-visits.  Approximately 5 minutes was spent documenting and reviewing patient's chart.   

## 2019-04-28 ENCOUNTER — Telehealth: Payer: No Typology Code available for payment source | Admitting: Nurse Practitioner

## 2019-04-28 DIAGNOSIS — J208 Acute bronchitis due to other specified organisms: Secondary | ICD-10-CM

## 2019-04-28 MED ORDER — BENZONATATE 100 MG PO CAPS: 100.0000 mg | ORAL_CAPSULE | Freq: Three times a day (TID) | ORAL | 0 refills | Status: DC | PRN

## 2019-04-28 MED ORDER — PREDNISONE 10 MG (21) PO TBPK: ORAL_TABLET | ORAL | 0 refills | Status: DC

## 2019-04-28 NOTE — Progress Notes (Signed)
We are sorry that you are not feeling well.  Here is how we plan to help!  Based on your presentation I believe you most likely have A cough due to a virus.  This is called viral bronchitis and is best treated by rest, plenty of fluids and control of the cough.  You may use Ibuprofen or Tylenol as directed to help your symptoms.     In addition you may use A prescription cough medication called Tessalon Perles 100mg. You may take 1-2 capsules every 8 hours as needed for your cough.  Prednisone 10 mg daily for 6 days (see taper instructions below)  Directions for 6 day taper: Day 1: 2 tablets before breakfast, 1 after both lunch & dinner and 2 at bedtime Day 2: 1 tab before breakfast, 1 after both lunch & dinner and 2 at bedtime Day 3: 1 tab at each meal & 1 at bedtime Day 4: 1 tab at breakfast, 1 at lunch, 1 at bedtime Day 5: 1 tab at breakfast & 1 tab at bedtime Day 6: 1 tab at breakfast   From your responses in the eVisit questionnaire you describe inflammation in the upper respiratory tract which is causing a significant cough.  This is commonly called Bronchitis and has four common causes:    Allergies  Viral Infections  Acid Reflux  Bacterial Infection Allergies, viruses and acid reflux are treated by controlling symptoms or eliminating the cause. An example might be a cough caused by taking certain blood pressure medications. You stop the cough by changing the medication. Another example might be a cough caused by acid reflux. Controlling the reflux helps control the cough.  USE OF BRONCHODILATOR ("RESCUE") INHALERS: There is a risk from using your bronchodilator too frequently.  The risk is that over-reliance on a medication which only relaxes the muscles surrounding the breathing tubes can reduce the effectiveness of medications prescribed to reduce swelling and congestion of the tubes themselves.  Although you feel brief relief from the bronchodilator inhaler, your asthma may  actually be worsening with the tubes becoming more swollen and filled with mucus.  This can delay other crucial treatments, such as oral steroid medications. If you need to use a bronchodilator inhaler daily, several times per day, you should discuss this with your provider.  There are probably better treatments that could be used to keep your asthma under control.     HOME CARE . Only take medications as instructed by your medical team. . Complete the entire course of an antibiotic. . Drink plenty of fluids and get plenty of rest. . Avoid close contacts especially the very young and the elderly . Cover your mouth if you cough or cough into your sleeve. . Always remember to wash your hands . A steam or ultrasonic humidifier can help congestion.   GET HELP RIGHT AWAY IF: . You develop worsening fever. . You become short of breath . You cough up blood. . Your symptoms persist after you have completed your treatment plan MAKE SURE YOU   Understand these instructions.  Will watch your condition.  Will get help right away if you are not doing well or get worse.  Your e-visit answers were reviewed by a board certified advanced clinical practitioner to complete your personal care plan.  Depending on the condition, your plan could have included both over the counter or prescription medications. If there is a problem please reply  once you have received a response from your provider. Your   safety is important to us.  If you have drug allergies check your prescription carefully.    You can use MyChart to ask questions about today's visit, request a non-urgent call back, or ask for a work or school excuse for 24 hours related to this e-Visit. If it has been greater than 24 hours you will need to follow up with your provider, or enter a new e-Visit to address those concerns. You will get an e-mail in the next two days asking about your experience.  I hope that your e-visit has been valuable and will  speed your recovery. Thank you for using e-visits.  5-10 minutes spent reviewing and documenting in chart.  

## 2019-10-11 ENCOUNTER — Telehealth: Payer: Self-pay | Admitting: General Practice

## 2019-10-11 NOTE — Telephone Encounter (Signed)
Error

## 2020-01-13 ENCOUNTER — Ambulatory Visit: Payer: Self-pay | Admitting: Family Medicine

## 2020-01-17 ENCOUNTER — Ambulatory Visit
Admission: EM | Admit: 2020-01-17 | Discharge: 2020-01-17 | Disposition: A | Discharge status: Home / Self Care | Payer: No Typology Code available for payment source | Attending: Physician Assistant | Admitting: Physician Assistant

## 2020-01-17 ENCOUNTER — Other Ambulatory Visit: Payer: Self-pay

## 2020-01-17 DIAGNOSIS — J324 Chronic pansinusitis: Secondary | ICD-10-CM | POA: Diagnosis not present

## 2020-01-17 MED ORDER — PREDNISONE 50 MG PO TABS: 50.0000 mg | ORAL_TABLET | Freq: Every day | ORAL | 0 refills | Status: DC

## 2020-01-17 MED ORDER — DOXYCYCLINE HYCLATE 100 MG PO CAPS: 100.0000 mg | ORAL_CAPSULE | Freq: Two times a day (BID) | ORAL | 0 refills | Status: DC

## 2020-01-17 MED ORDER — FLUCONAZOLE 150 MG PO TABS: 150.0000 mg | ORAL_TABLET | Freq: Every day | ORAL | 0 refills | Status: DC

## 2020-01-17 MED ORDER — AZELASTINE HCL 0.1 % NA SOLN: 2.0000 | Freq: Two times a day (BID) | NASAL | 0 refills | Status: DC

## 2020-01-17 NOTE — Discharge Instructions (Signed)
Start prednisone, flonase, azelastine as directed. If symptoms not improving, can fill doxycycline to cover for bacterial causes of symptoms. Follow up with PCP/ENT for further evaluation if symptoms not improve.

## 2020-01-17 NOTE — ED Provider Notes (Signed)
[facility]    CSN: [id number] Arrival date & time: 01/17/20  1840      History   Chief Complaint Chief Complaint  Patient presents with   Facial Pain    HPI Donna Bond is a 39 y.o. female.   39 year old female comes in for 2 month history of facial pressure, nasal drainage, post nasal drip. Symptoms can improve, then worsen, but has not completely resolved.  Denies fever, chills, body aches. Denies shortness of breath, loss of taste/smell. claritin without relief.      Past Medical History:  Diagnosis Date   Morbid obesity [facility])     Patient Active Problem List   Diagnosis Date Noted   Chronic low back pain without sciatica 08/18/2017   Morbid obesity with BMI of 50.0-59.9, adult (HCC) 08/18/2017    Past Surgical History:  Procedure Laterality Date   CESAREAN SECTION      OB History   No obstetric history on file.      Home Medications    Prior to Admission medications   Medication Sig Start Date End Date Taking? Authorizing Provider  azelastine (ASTELIN) 0.1 % nasal spray Place 2 sprays into both nostrils 2 (two) times daily. 01/17/20   Cathie Hoops, Coree Brame V, PA-C  doxycycline (VIBRAMYCIN) 100 MG capsule Take 1 capsule (100 mg total) by mouth 2 (two) times daily. 01/17/20   Cathie Hoops, Deondra Labrador V, PA-C  fluconazole (DIFLUCAN) 150 MG tablet Take 1 tablet (150 mg total) by mouth daily. Take second dose 72 hours later if symptoms still persists. 01/17/20   Cathie Hoops, Dvante Hands V, PA-C  fluticasone (FLONASE) 50 MCG/ACT nasal spray Place 2 sprays into both nostrils daily. 03/28/19   Junie Spencer, FNP  predniSONE (DELTASONE) 50 MG tablet Take 1 tablet (50 mg total) by mouth daily with breakfast. 01/17/20   Belinda Fisher, PA-C    Family History Family History  Problem Relation Age of Onset   Hypercalcemia Mother    Hypertension Mother    Heart attack Mother     Social History Social History   Tobacco Use   Smoking status: Never Smoker   Smokeless tobacco: Never Used    Substance Use Topics   Alcohol use: Yes    Comment: occasional   Drug use: No     Allergies   Patient has no known allergies.   Review of Systems Review of Systems  Reason unable to perform ROS: See HPI as above.     Physical Exam Triage Vital Signs ED Triage Vitals  Enc Vitals Group     BP 01/17/20 1943 (!) 151/91     Pulse Rate 01/17/20 1943 72     Resp 01/17/20 1943 18     Temp 01/17/20 1943 98.5 F (36.9 C)     Temp Source 01/17/20 1943 Oral     SpO2 01/17/20 1943 97 %     Weight --      Height --      Head Circumference --      Peak Flow --      Pain Score 01/17/20 1956 0     Pain Loc --      Pain Edu? --      Excl. in GC? --    No data found.  Updated Vital Signs BP (!) 151/91 (BP Location: Left Arm)    Pulse 72    Temp 98.5 F (36.9 C) (Oral)    Resp 18    SpO2 97%  Physical Exam Constitutional:      General: She is not in acute distress.    Appearance: Normal appearance. She is well-developed. She is not ill-appearing, toxic-appearing or diaphoretic.  HENT:     Head: Normocephalic and atraumatic.     Right Ear: Tympanic membrane, ear canal and external ear normal. Tympanic membrane is not erythematous or bulging.     Left Ear: Tympanic membrane, ear canal and external ear normal. Tympanic membrane is not erythematous or bulging.     Nose:     Right Sinus: No maxillary sinus tenderness or frontal sinus tenderness.     Left Sinus: No maxillary sinus tenderness or frontal sinus tenderness.     Mouth/Throat:     Mouth: Mucous membranes are moist.     Pharynx: Oropharynx is clear. Uvula midline.  Eyes:     Conjunctiva/sclera: Conjunctivae normal.     Pupils: Pupils are equal, round, and reactive to light.  Cardiovascular:     Rate and Rhythm: Normal rate and regular rhythm.  Pulmonary:     Effort: Pulmonary effort is normal. No accessory muscle usage, prolonged expiration, respiratory distress or retractions.     Breath sounds: No decreased air  movement or transmitted upper airway sounds. No decreased breath sounds.     Comments: LCTAB Musculoskeletal:     Cervical back: Normal range of motion and neck supple.  Skin:    General: Skin is warm and dry.  Neurological:     Mental Status: She is alert and oriented to person, place, and time.      UC Treatments / Results  Labs (all labs ordered are listed, but only abnormal results are displayed) Labs Reviewed - No data to display  EKG   Radiology No results found.  Procedures Procedures (including critical care time)  Medications Ordered in UC Medications - No data to display  Initial Impression / Assessment and Plan / UC Course  I have reviewed the triage vital signs and the nursing notes.  Pertinent labs & imaging results that were available during my care of the patient were reviewed by me and considered in my medical decision making (see chart for details).    No obvious signs of bacterial involvement.  Will start with prednisone, nasal sprays for symptomatic management.  However, if symptoms not improving, given 19-month history of symptoms, can fill doxycycline to cover for bacterial infection.  Return precautions given.  Final Clinical Impressions(s) / UC Diagnoses   Final diagnoses:  Chronic pansinusitis   ED Prescriptions    Medication Sig Dispense Auth. Provider   predniSONE (DELTASONE) 50 MG tablet Take 1 tablet (50 mg total) by mouth daily with breakfast. 5 tablet Jay Kempe V, PA-C   azelastine (ASTELIN) 0.1 % nasal spray Place 2 sprays into both nostrils 2 (two) times daily. 30 mL Barett Whidbee V, PA-C   doxycycline (VIBRAMYCIN) 100 MG capsule Take 1 capsule (100 mg total) by mouth 2 (two) times daily. 14 capsule Darcey Cardy V, PA-C   fluconazole (DIFLUCAN) 150 MG tablet Take 1 tablet (150 mg total) by mouth daily. Take second dose 72 hours later if symptoms still persists. 2 tablet Belinda Fisher, PA-C     PDMP not reviewed this encounter.   Belinda Fisher,  PA-C 01/17/20 2027

## 2020-01-17 NOTE — ED Triage Notes (Signed)
Pt c/o sinus infection since July. C/o facial pressure, and drainage/itching lt ear. States her PCP cancelled her appt that was made. Pt refuse covid testing.

## 2020-03-15 ENCOUNTER — Other Ambulatory Visit: Payer: Self-pay

## 2020-03-15 ENCOUNTER — Ambulatory Visit (INDEPENDENT_AMBULATORY_CARE_PROVIDER_SITE_OTHER): Payer: No Typology Code available for payment source | Admitting: Family Medicine

## 2020-03-15 VITALS — BP 128/67 | HR 87 | Temp 97.9°F | Ht 61.0 in | Wt 326.0 lb

## 2020-03-15 DIAGNOSIS — Z0000 Encounter for general adult medical examination without abnormal findings: Secondary | ICD-10-CM

## 2020-03-15 DIAGNOSIS — Z23 Encounter for immunization: Secondary | ICD-10-CM

## 2020-03-15 DIAGNOSIS — Z114 Encounter for screening for human immunodeficiency virus [HIV]: Secondary | ICD-10-CM

## 2020-03-15 DIAGNOSIS — J329 Chronic sinusitis, unspecified: Secondary | ICD-10-CM

## 2020-03-15 DIAGNOSIS — Z1159 Encounter for screening for other viral diseases: Secondary | ICD-10-CM

## 2020-03-15 DIAGNOSIS — Z0001 Encounter for general adult medical examination with abnormal findings: Secondary | ICD-10-CM

## 2020-03-15 DIAGNOSIS — Z6841 Body Mass Index (BMI) 40.0 and over, adult: Secondary | ICD-10-CM

## 2020-03-15 MED ORDER — FLUTICASONE PROPIONATE 50 MCG/ACT NA SUSP
2.0000 | Freq: Every day | NASAL | 4 refills | Status: DC
  Filled 2020-08-30 – 2020-11-06 (×2): qty 16, 30d supply, fill #0

## 2020-03-15 MED ORDER — LORATADINE-PSEUDOEPHEDRINE ER 10-240 MG PO TB24: 1.0000 | ORAL_TABLET | Freq: Every day | ORAL | 3 refills | Status: DC

## 2020-03-15 MED ORDER — TETANUS-DIPHTHERIA TOXOIDS TD 5-2 LFU IM INJ: 0.5000 mL | INJECTION | Freq: Once | INTRAMUSCULAR | Status: DC
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
## 2020-03-15 NOTE — Patient Instructions (Addendum)
Eye appointment yearly Dentist q 6 months Referral placed to medical weight management, they will contact you   Health Maintenance, Female Adopting a healthy lifestyle and getting preventive care are important in promoting health and wellness. Ask your health care provider about:  The right schedule for you to have regular tests and exams.  Things you can do on your own to prevent diseases and keep yourself healthy. What should I know about diet, weight, and exercise? Eat a healthy diet   Eat a diet that includes plenty of vegetables, fruits, low-fat dairy products, and lean protein.  Do not eat a lot of foods that are high in solid fats, added sugars, or sodium. Maintain a healthy weight Body mass index (BMI) is used to identify weight problems. It estimates body fat based on height and weight. Your health care provider can help determine your BMI and help you achieve or maintain a healthy weight. Get regular exercise Get regular exercise. This is one of the most important things you can do for your health. Most adults should:  Exercise for at least 150 minutes each week. The exercise should increase your heart rate and make you sweat (moderate-intensity exercise).  Do strengthening exercises at least twice a week. This is in addition to the moderate-intensity exercise.  Spend less time sitting. Even light physical activity can be beneficial. Watch cholesterol and blood lipids Have your blood tested for lipids and cholesterol at 39 years of age, then have this test every 5 years. Have your cholesterol levels checked more often if:  Your lipid or cholesterol levels are high.  You are older than 39 years of age.  You are at high risk for heart disease. What should I know about cancer screening? Depending on your health history and family history, you may need to have cancer screening at various ages. This may include screening for:  Breast cancer.  Cervical  cancer.  Colorectal cancer.  Skin cancer.  Lung cancer. What should I know about heart disease, diabetes, and high blood pressure? Blood pressure and heart disease  High blood pressure causes heart disease and increases the risk of stroke. This is more likely to develop in people who have high blood pressure readings, are of African descent, or are overweight.  Have your blood pressure checked: ? Every 3-5 years if you are 28-29 years of age. ? Every year if you are 37 years old or older. Diabetes Have regular diabetes screenings. This checks your fasting blood sugar level. Have the screening done:  Once every three years after age 65 if you are at a normal weight and have a low risk for diabetes.  More often and at a younger age if you are overweight or have a high risk for diabetes. What should I know about preventing infection? Hepatitis B If you have a higher risk for hepatitis B, you should be screened for this virus. Talk with your health care provider to find out if you are at risk for hepatitis B infection. Hepatitis C Testing is recommended for:  Everyone born from [birth year] through 1965.  Anyone with known risk factors for hepatitis C. Sexually transmitted infections (STIs)  Get screened for STIs, including gonorrhea and chlamydia, if: ? You are sexually active and are younger than 39 years of age. ? You are older than 39 years of age and your health care provider tells you that you are at risk for this type of infection. ? Your sexual activity has changed since you  were last screened, and you are at increased risk for chlamydia or gonorrhea. Ask your health care provider if you are at risk.  Ask your health care provider about whether you are at high risk for HIV. Your health care provider may recommend a prescription medicine to help prevent HIV infection. If you choose to take medicine to prevent HIV, you should first get tested for HIV. You should then be tested every 3  months for as long as you are taking the medicine. Pregnancy  If you are about to stop having your period (premenopausal) and you may become pregnant, seek counseling before you get pregnant.  Take 400 to 800 micrograms (mcg) of folic acid every day if you become pregnant.  Ask for birth control (contraception) if you want to prevent pregnancy. Osteoporosis and menopause Osteoporosis is a disease in which the bones lose minerals and strength with aging. This can result in bone fractures. If you are 50 years old or older, or if you are at risk for osteoporosis and fractures, ask your health care provider if you should:  Be screened for bone loss.  Take a calcium or vitamin D supplement to lower your risk of fractures.  Be given hormone replacement therapy (HRT) to treat symptoms of menopause. Follow these instructions at home: Lifestyle  Do not use any products that contain nicotine or tobacco, such as cigarettes, e-cigarettes, and chewing tobacco. If you need help quitting, ask your health care provider.  Do not use street drugs.  Do not share needles.  Ask your health care provider for help if you need support or information about quitting drugs. Alcohol use  Do not drink alcohol if: ? Your health care provider tells you not to drink. ? You are pregnant, may be pregnant, or are planning to become pregnant.  If you drink alcohol: ? Limit how much you use to 0-1 drink a day. ? Limit intake if you are breastfeeding.  Be aware of how much alcohol is in your drink. In the [place] one drink equals one 12 oz bottle of beer (355 mL), one 5 oz glass of wine (148 mL), or one 1 oz glass of hard liquor (44 mL). General instructions  Schedule regular health, dental, and eye exams.  Stay current with your vaccines.  Tell your health care provider if: ? You often feel depressed. ? You have ever been abused or do not feel safe at home. Summary  Adopting a healthy lifestyle and getting  preventive care are important in promoting health and wellness.  Follow your health care provider's instructions about healthy diet, exercising, and getting tested or screened for diseases.  Follow your health care provider's instructions on monitoring your cholesterol and blood pressure. This information is not intended to replace advice given to you by your health care provider. Make sure you discuss any questions you have with your health care provider. Document Revised: 04/01/2018 Document Reviewed: 04/01/2018 Elsevier Patient Education  [employer].      If you have lab work done today you will be contacted with your lab results within the next 2 weeks.  If you have not heard from [name] then please contact us. The fastest way to get your results is to register for My Chart.   IF you received an x-ray today, you will receive an invoice from [facility]. Please contact [facility] at [phone] with questions or concerns regarding your invoice.   IF you received labwork today, you will receive an  invoice from [facility]. Please contact [facility] at [phone] with questions or concerns regarding your invoice.   Our billing staff will not be able to assist you with questions regarding bills from these companies.  You will be contacted with the lab results as soon as they are available. The fastest way to get your results is to activate your My Chart account. Instructions are located on the last page of this paperwork. If you have not heard from [name] regarding the results in 2 weeks, please contact this office.

## 2020-03-16 LAB — CMP14+EGFR
ALT: 11 IU/L (ref 0–32)
AST: 12 IU/L (ref 0–40)
Albumin/Globulin Ratio: 1.2 (ref 1.2–2.2)
Albumin: 4 g/dL (ref 3.8–4.8)
Alkaline Phosphatase: 114 IU/L (ref 44–121)
BUN/Creatinine Ratio: 12 (ref 9–23)
BUN: 10 mg/dL (ref 6–20)
Bilirubin Total: 0.3 mg/dL (ref 0.0–1.2)
CO2: 24 mmol/L (ref 20–29)
Calcium: 9.1 mg/dL (ref 8.7–10.2)
Chloride: 104 mmol/L (ref 96–106)
Creatinine, Ser: 0.83 mg/dL (ref 0.57–1.00)
GFR calc Af Amer: 103 mL/min/{1.73_m2} (ref >59)
GFR calc non Af Amer: 90 mL/min/{1.73_m2} (ref >59)
Globulin, Total: 3.4 g/dL (ref 1.5–4.5)
Glucose: 89 mg/dL (ref 65–99)
Potassium: 4.2 mmol/L (ref 3.5–5.2)
Sodium: 140 mmol/L (ref 134–144)
Total Protein: 7.4 g/dL (ref 6.0–8.5)

## 2020-03-16 LAB — LIPID PANEL
Chol/HDL Ratio: 3.9 ratio (ref 0.0–4.4)
Cholesterol, Total: 154 mg/dL (ref 100–199)
HDL: 39 mg/dL — ABNORMAL LOW (ref >39)
LDL Chol Calc (NIH): 96 mg/dL (ref 0–99)
Triglycerides: 105 mg/dL (ref 0–149)
VLDL Cholesterol Cal: 19 mg/dL (ref 5–40)

## 2020-03-16 LAB — CBC
Hematocrit: 40.4 % (ref 34.0–46.6)
Hemoglobin: 11.9 g/dL (ref 11.1–15.9)
MCH: 20.2 pg — ABNORMAL LOW (ref 26.6–33.0)
MCHC: 29.5 g/dL — ABNORMAL LOW (ref 31.5–35.7)
MCV: 69 fL — ABNORMAL LOW (ref 79–97)
Platelets: 376 10*3/uL (ref 150–450)
RBC: 5.9 x10E6/uL — ABNORMAL HIGH (ref 3.77–5.28)
RDW: 18.4 % — ABNORMAL HIGH (ref 11.7–15.4)
WBC: 12.6 10*3/uL — ABNORMAL HIGH (ref 3.4–10.8)

## 2020-03-16 LAB — HIV ANTIBODY (ROUTINE TESTING W REFLEX): HIV Screen 4th Generation wRfx: NONREACTIVE

## 2020-03-16 LAB — HCV INTERPRETATION

## 2020-03-16 LAB — IRON,TIBC AND FERRITIN PANEL
Ferritin: 70 ng/mL (ref 15–150)
Iron Saturation: 11 % — ABNORMAL LOW (ref 15–55)
Iron: 48 ug/dL (ref 27–159)
Total Iron Binding Capacity: 434 ug/dL (ref 250–450)
UIBC: 386 ug/dL (ref 131–425)

## 2020-03-16 LAB — TSH: TSH: 1.17 u[IU]/mL (ref 0.450–4.500)

## 2020-03-16 LAB — HCV AB W/RFLX TO VERIFICATION: HCV Ab: <0.1 s/co ratio (ref 0.0–0.9)

## 2020-03-16 LAB — VITAMIN D 25 HYDROXY (VIT D DEFICIENCY, FRACTURES): Vit D, 25-Hydroxy: 16.6 ng/mL — ABNORMAL LOW (ref 30.0–100.0)

## 2020-03-16 LAB — HEMOGLOBIN A1C
Est. average glucose Bld gHb Est-mCnc: 128 mg/dL
Hgb A1c MFr Bld: 6.1 % — ABNORMAL HIGH (ref 4.8–5.6)

## 2020-04-26 ENCOUNTER — Encounter: Payer: Self-pay | Admitting: Family Medicine

## 2020-05-02 ENCOUNTER — Encounter: Payer: Self-pay | Admitting: Family Medicine

## 2020-05-02 ENCOUNTER — Ambulatory Visit (INDEPENDENT_AMBULATORY_CARE_PROVIDER_SITE_OTHER): Payer: No Typology Code available for payment source | Admitting: Family Medicine

## 2020-05-02 ENCOUNTER — Other Ambulatory Visit: Payer: Self-pay

## 2020-05-02 VITALS — BP 134/95 | HR 93 | Temp 97.3°F | Ht 61.0 in | Wt 326.0 lb

## 2020-05-02 DIAGNOSIS — R309 Painful micturition, unspecified: Secondary | ICD-10-CM

## 2020-05-02 DIAGNOSIS — Z6841 Body Mass Index (BMI) 40.0 and over, adult: Secondary | ICD-10-CM | POA: Diagnosis not present

## 2020-05-02 DIAGNOSIS — N3001 Acute cystitis with hematuria: Secondary | ICD-10-CM

## 2020-05-02 LAB — POCT URINALYSIS DIP (MANUAL ENTRY)
Bilirubin, UA: NEGATIVE
Glucose, UA: NEGATIVE mg/dL
Ketones, POC UA: NEGATIVE mg/dL
Nitrite, UA: NEGATIVE
Protein Ur, POC: NEGATIVE mg/dL
Spec Grav, UA: 1.025 (ref 1.010–1.025)
Urobilinogen, UA: 0.2 E.U./dL
pH, UA: 6 (ref 5.0–8.0)

## 2020-05-02 MED ORDER — FLUCONAZOLE 150 MG PO TABS: 150.0000 mg | ORAL_TABLET | Freq: Once | ORAL | 0 refills | Status: AC

## 2020-05-02 MED ORDER — NITROFURANTOIN MONOHYD MACRO 100 MG PO CAPS: 100.0000 mg | ORAL_CAPSULE | Freq: Two times a day (BID) | ORAL | 0 refills | Status: AC

## 2020-05-02 NOTE — Patient Instructions (Signed)
Urinary Tract Infection, Adult A urinary tract infection (UTI) is an infection of any part of the urinary tract. The urinary tract includes:  The kidneys.  The ureters.  The bladder.  The urethra. These organs make, store, and get rid of pee (urine) in the body. What are the causes? This infection is caused by germs (bacteria) in your genital area. These germs grow and cause swelling (inflammation) of your urinary tract. What increases the risk? The following factors may make you more likely to develop this condition:  Using a small, thin tube (catheter) to drain pee.  Not being able to control when you pee or poop (incontinence).  Being female. If you are female, these things can increase the risk: ? Using these methods to prevent pregnancy:  A medicine that kills sperm (spermicide).  A device that blocks sperm (diaphragm). ? Having low levels of a female hormone (estrogen). ? Being pregnant. You are more likely to develop this condition if:  You have genes that add to your risk.  You are sexually active.  You take antibiotic medicines.  You have trouble peeing because of: ? A prostate that is bigger than normal, if you are female. ? A blockage in the part of your body that drains pee from the bladder. ? A kidney stone. ? A nerve condition that affects your bladder. ? Not getting enough to drink. ? Not peeing often enough.  You have other conditions, such as: ? Diabetes. ? A weak disease-fighting system (immune system). ? Sickle cell disease. ? Gout. ? Injury of the spine. What are the signs or symptoms? Symptoms of this condition include:  Needing to pee right away.  Peeing small amounts often.  Pain or burning when peeing.  Blood in the pee.  Pee that smells bad or not like normal.  Trouble peeing.  Pee that is cloudy.  Fluid coming from the vagina, if you are female.  Pain in the belly or lower back. Other symptoms include:  Vomiting.  Not  feeling hungry.  Feeling mixed up (confused). This may be the first symptom in older adults.  Being tired and grouchy (irritable).  A fever.  Watery poop (diarrhea). How is this treated?  Taking antibiotic medicine.  Taking other medicines.  Drinking enough water. In some cases, you may need to see a specialist. Follow these instructions at home: Medicines  Take over-the-counter and prescription medicines only as told by your doctor.  If you were prescribed an antibiotic medicine, take it as told by your doctor. Do not stop taking it even if you start to feel better. General instructions  Make sure you: ? Pee until your bladder is empty. ? Do not hold pee for a long time. ? Empty your bladder after sex. ? Wipe from front to back after peeing or pooping if you are a female. Use each tissue one time when you wipe.  Drink enough fluid to keep your pee pale yellow.  Keep all follow-up visits.   Contact a doctor if:  You do not get better after 1-2 days.  Your symptoms go away and then come back. Get help right away if:  You have very bad back pain.  You have very bad pain in your lower belly.  You have a fever.  You have chills.  You feeling like you will vomit or you vomit. Summary  A urinary tract infection (UTI) is an infection of any part of the urinary tract.  This condition is caused by   germs in your genital area.  There are many risk factors for a UTI.  Treatment includes antibiotic medicines.  Drink enough fluid to keep your pee pale yellow. This information is not intended to replace advice given to you by your health care provider. Make sure you discuss any questions you have with your health care provider. Document Revised: 11/19/2019 Document Reviewed: 11/19/2019 Elsevier Patient Education  2021 Elsevier Inc.  
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
## 2020-05-04 LAB — URINE CULTURE

## 2020-05-27 ENCOUNTER — Encounter (INDEPENDENT_AMBULATORY_CARE_PROVIDER_SITE_OTHER): Payer: Self-pay

## 2020-05-29 ENCOUNTER — Encounter: Payer: Self-pay | Admitting: Family Medicine

## 2020-05-29 ENCOUNTER — Ambulatory Visit (INDEPENDENT_AMBULATORY_CARE_PROVIDER_SITE_OTHER): Payer: No Typology Code available for payment source | Admitting: Family Medicine

## 2020-05-29 ENCOUNTER — Other Ambulatory Visit: Payer: Self-pay

## 2020-05-29 VITALS — BP 132/85 | HR 99 | Temp 97.8°F | Ht 62.0 in | Wt 328.6 lb

## 2020-05-29 DIAGNOSIS — Z6841 Body Mass Index (BMI) 40.0 and over, adult: Secondary | ICD-10-CM | POA: Diagnosis not present

## 2020-05-29 DIAGNOSIS — R059 Cough, unspecified: Secondary | ICD-10-CM

## 2020-05-29 DIAGNOSIS — J309 Allergic rhinitis, unspecified: Secondary | ICD-10-CM | POA: Diagnosis not present

## 2020-05-29 MED ORDER — BENZONATATE 100 MG PO CAPS: 100.0000 mg | ORAL_CAPSULE | Freq: Three times a day (TID) | ORAL | 0 refills | Status: DC | PRN

## 2020-05-29 MED ORDER — PREDNISONE 20 MG PO TABS: ORAL_TABLET | ORAL | 0 refills | Status: AC
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
## 2020-05-29 NOTE — Patient Instructions (Addendum)
 Cough, Adult A cough helps to clear your throat and lungs. A cough may be a sign of an illness or another medical condition. An acute cough may only last 2-3 weeks, while a chronic cough may last 8 or more weeks. Many things can cause a cough. They include:  Germs (viruses or bacteria) that attack the airway.  Breathing in things that bother (irritate) your lungs.  Allergies.  Asthma.  Mucus that runs down the back of your throat (postnasal drip).  Smoking.  Acid backing up from the stomach into the tube that moves food from the mouth to the stomach (gastroesophageal reflux).  Some medicines.  Lung problems.  Other medical conditions, such as heart failure or a blood clot in the lung (pulmonary embolism). Follow these instructions at home: Medicines  Take over-the-counter and prescription medicines only as told by your doctor.  Talk with your doctor before you take medicines that stop a cough (cough suppressants). Lifestyle  Do not smoke, and try not to be around smoke. Do not use any products that contain nicotine or tobacco, such as cigarettes, e-cigarettes, and chewing tobacco. If you need help quitting, ask your doctor.  Drink enough fluid to keep your pee (urine) pale yellow.  Avoid caffeine.  Do not drink alcohol if your doctor tells you not to drink.   General instructions  Watch for any changes in your cough. Tell your doctor about them.  Always cover your mouth when you cough.  Stay away from things that make you cough, such as perfume, candles, campfire smoke, or cleaning products.  If the air is dry, use a cool mist vaporizer or humidifier in your home.  If your cough is worse at night, try using extra pillows to raise your head up higher while you sleep.  Rest as needed.  Keep all follow-up visits as told by your doctor. This is important.   Contact a doctor if:  You have new symptoms.  You cough up pus.  Your cough does not get better after  2-3 weeks, or your cough gets worse.  Cough medicine does not help your cough and you are not sleeping well.  You have pain that gets worse or pain that is not helped with medicine.  You have a fever.  You are losing weight and you do not know why.  You have night sweats. Get help right away if:  You cough up blood.  You have trouble breathing.  Your heartbeat is very fast. These symptoms may be an emergency. Do not wait to see if the symptoms will go away. Get medical help right away. Call your local emergency services (911 in the [place] Do not drive yourself to the hospital. Summary  A cough helps to clear your throat and lungs. Many things can cause a cough.  Take over-the-counter and prescription medicines only as told by your doctor.  Always cover your mouth when you cough.  Contact a doctor if you have new symptoms or you have a cough that does not get better or gets worse. This information is not intended to replace advice given to you by your health care provider. Make sure you discuss any questions you have with your health care provider. Document Revised: 05/28/2019 Document Reviewed: 04/27/2018 Elsevier Patient Education  2021 Elsevier Inc.    If you have lab work done today you will be contacted with your lab results within the next 2 weeks.  If you have not heard from us then please   contact us. The fastest way to get your results is to register for My Chart.   IF you received an x-ray today, you will receive an invoice from [facility]. Please contact  Radiology at [phone] with questions or concerns regarding your invoice.   IF you received labwork today, you will receive an invoice from [facility]. Please contact [facility] at [phone] with questions or concerns regarding your invoice.   Our billing staff will not be able to assist you with questions regarding bills from these companies.  You will be contacted with the lab results as  soon as they are available. The fastest way to get your results is to activate your My Chart account. Instructions are located on the last page of this paperwork. If you have not heard from us regarding the results in 2 weeks, please contact this office.      

## 2020-06-08 ENCOUNTER — Other Ambulatory Visit: Payer: Self-pay

## 2020-06-08 ENCOUNTER — Encounter (INDEPENDENT_AMBULATORY_CARE_PROVIDER_SITE_OTHER): Payer: Self-pay | Admitting: Family Medicine

## 2020-06-08 ENCOUNTER — Ambulatory Visit (INDEPENDENT_AMBULATORY_CARE_PROVIDER_SITE_OTHER): Payer: No Typology Code available for payment source | Admitting: Family Medicine

## 2020-06-08 VITALS — BP 128/82 | HR 91 | Temp 97.8°F | Ht 62.0 in | Wt 325.0 lb

## 2020-06-08 DIAGNOSIS — R7303 Prediabetes: Secondary | ICD-10-CM | POA: Insufficient documentation

## 2020-06-08 DIAGNOSIS — R5383 Other fatigue: Secondary | ICD-10-CM | POA: Diagnosis not present

## 2020-06-08 DIAGNOSIS — Z0289 Encounter for other administrative examinations: Secondary | ICD-10-CM

## 2020-06-08 DIAGNOSIS — E611 Iron deficiency: Secondary | ICD-10-CM

## 2020-06-08 DIAGNOSIS — Z6841 Body Mass Index (BMI) 40.0 and over, adult: Secondary | ICD-10-CM

## 2020-06-08 DIAGNOSIS — R0602 Shortness of breath: Secondary | ICD-10-CM | POA: Diagnosis not present

## 2020-06-08 DIAGNOSIS — E559 Vitamin D deficiency, unspecified: Secondary | ICD-10-CM | POA: Diagnosis not present

## 2020-06-08 DIAGNOSIS — Z9189 Other specified personal risk factors, not elsewhere classified: Secondary | ICD-10-CM

## 2020-06-08 DIAGNOSIS — F3289 Other specified depressive episodes: Secondary | ICD-10-CM | POA: Diagnosis not present

## 2020-06-08 DIAGNOSIS — F32A Depression, unspecified: Secondary | ICD-10-CM | POA: Insufficient documentation

## 2020-06-08 NOTE — Progress Notes (Signed)
Dear Donna Carls Just, FNP,   Thank you for referring Donna Bond to our clinic. The following note includes my evaluation and treatment recommendations.  Chief Complaint:   OBESITY Donna Bond (MR# [id number]) is a 40 y.o. female who presents for evaluation and treatment of obesity and related comorbidities. Current BMI is Body mass index is 59.44 kg/m. Donna Bond has been struggling with her weight for many years and has been unsuccessful in either losing weight, maintaining weight loss, or reaching her healthy weight goal.  Donna Bond is currently in the action stage of change and ready to dedicate time achieving and maintaining a healthier weight. Donna Bond is interested in becoming our patient and working on intensive lifestyle modifications including (but not limited to) diet and exercise for weight loss.  Donna Bond works at [employer].  She lives with her 2 sons, ages 60 and 57.  She is single.  She lost 60 pounds in the past by working out twice daily and eating on the food pyramid.  She snacks on sweets, chips, and salty/crunchy foods.  She occasionally skips breakfast and lunch.  Her worst habit is fast foods (eats out 2-4 days per week).  Donna Bond's habits were reviewed today and are as follows: Her family eats meals together, she thinks her family will eat healthier with her, her desired weight loss is 173 pounds, she has been heavy most of her life, she started gaining weight at age 42, her heaviest weight ever was 328 pounds, she craves sweets, chips, and steak, she snacks frequently in the evenings, she wakes up frequently in the middle of the night to eat, she skips breakfast and sometimes lunch frequently, she is frequently drinking liquids with calories, she frequently makes poor food choices, she frequently eats larger portions than normal and she struggles with emotional eating.  Depression Screen Donna Bond's Food and Mood (modified PHQ-9) score was 7.  Depression screen Donna Bond 2/9 06/08/2020  Decreased Interest  1  Down, Depressed, Hopeless 0  PHQ - 2 Score 1  Altered sleeping 1  Tired, decreased energy 3  Change in appetite 2  Feeling bad or failure about yourself  0  Trouble concentrating 0  Moving slowly or fidgety/restless 0  Suicidal thoughts 0  PHQ-9 Score 7  Difficult doing work/chores Not difficult at all   Assessment/Plan:   1. Other fatigue Donna Bond denies daytime somnolence and admits to waking up still tired. Patent has a history of symptoms of morning fatigue, morning headache and snoring. Asiyah generally gets 7 hours of sleep per night, and states that she has generally restful sleep. Snoring is present. Apneic episodes are not present. Epworth Sleepiness Score is 8.  Donna Bond does feel that her weight is causing her energy to be lower than it should be. Fatigue may be related to obesity, depression or many other causes. Labs will be ordered, and in the meanwhile, Donna Bond will focus on self care including making healthy food choices, increasing physical activity and focusing on stress reduction.  Will check labs today.  - EKG 12-Lead - Comprehensive metabolic panel - Lipid panel - T3 - T4, free - TSH  2. SOBOE (shortness of breath on exertion) Donna Bond notes increasing shortness of breath with exercising and seems to be worsening over time with weight gain. She notes getting out of breath sooner with activity than she used to. This has gotten worse recently. Donna Bond denies shortness of breath at rest or orthopnea.  Donna Bond does feel that she gets out of breath  more easily that she used to when she exercises. Donna Bond's shortness of breath appears to be obesity related and exercise induced. She has agreed to work on weight loss and gradually increase exercise to treat her exercise induced shortness of breath. Will continue to monitor closely.   - Comprehensive metabolic panel - Lipid panel - T3 - T4, free - TSH  3. Prediabetes Not at goal. Goal is HgbA1c < 5.7.  Medication: None.  She was diagnosed around  6 months ago.  Plan:  She will continue to focus on protein-rich, low simple carbohydrate foods. We reviewed the importance of hydration, regular exercise for stress reduction, and restorative sleep.  Will check labs today, as per below.  Lab Results  Component Value Date   HGBA1C 6.1 (H) 03/15/2020   - Comprehensive metabolic panel - Hemoglobin A1c - Insulin, random - Lipid panel  4. Vitamin D deficiency Not at goal. Current vitamin D is 16.6, tested on 03/15/2020. Optimal goal > 50 ng/dL.   Plan: Donna Bond is taking no vitamin D supplement.  We will check her vitamin D level today.  - VITAMIN D 25 Hydroxy (Vit-D Deficiency, Fractures)  5. Dietary iron deficiency Donna Bond is not taking an iron supplement.  Plan:  Check labs today, as per below.  Nutrition: Iron-rich foods include dark leafy greens, red and white meats, eggs, seafood, and beans.  Certain foods and drinks prevent your body from absorbing iron properly. Avoid eating these foods in the same meal as iron-rich foods or with iron supplements. These foods include: coffee, black tea, and red wine; milk, dairy products, and foods that are high in calcium; beans and soybeans; whole grains. Constipation can be a side effect of iron supplementation. Increased water and fiber intake are helpful. Water goal: > 2 liters/day. Fiber goal: > 25 grams/day.  - Comprehensive metabolic panel - CBC with Differential/Platelet - Vitamin B12 - Folate - Iron and TIBC - Ferritin - Transferrin  6. Other depression, with emotional eating Not at goal. Medication: None.  No SI.   Plan:  Behavior modification techniques were discussed today to help deal with emotional/non-hunger eating behaviors.  7. At risk for diabetes mellitus - Donna Bond was given diabetes prevention education and counseling today of more than 23 minutes.  - Counseled patient on pathophysiology of disease and meaning/ implication of lab results.  - Reviewed how certain foods can either  stimulate or inhibit insulin release, and subsequently affect hunger pathways  - Importance of following a healthy meal plan with limiting amounts of simple carbohydrates discussed with patient - Effects of regular aerobic exercise on blood sugar regulation reviewed and encouraged an eventual goal of 30 min 5d/week or more as a minimum.  - Briefly discussed treatment options, which always include dietary and lifestyle modification as first line.   - Handouts provided at patient's desire and/or told to go online to the American Diabetes Association website for further information.  8. Class 3 severe obesity with serious comorbidity and body mass index (BMI) of 50.0 to 59.9 in adult, unspecified obesity type (HCC)  Donna Bond is currently in the action stage of change and her goal is to continue with weight loss efforts. I recommend Donna Bond begin the structured treatment plan as follows:  She has agreed to the Category 3 Plan with breakfast options.  Exercise goals: As is.   Behavioral modification strategies: increasing lean protein intake, decreasing simple carbohydrates, decreasing liquid calories, meal planning and cooking strategies and planning for success.  She was  informed of the importance of frequent follow-up visits to maximize her success with intensive lifestyle modifications for her multiple health conditions. She was informed we would discuss her lab results at her next visit unless there is a critical issue that needs to be addressed sooner. Donna Bond agreed to keep her next visit at the agreed upon time to discuss these results.  Objective:   Blood pressure 128/82, pulse 91, temperature 97.8 F (36.6 C), height 5\' 2"  (1.575 m), weight (!) 325 lb (147.4 kg), SpO2 100 %. Body mass index is 59.44 kg/m.  EKG: Normal sinus rhythm, rate 80 bpm.  Indirect Calorimeter completed today shows a VO2 of 342 and a REE of 2382.  Her calculated basal metabolic rate is 16102009 thus her basal metabolic rate is  better than expected.  General: Cooperative, alert, well developed, in no acute distress. HEENT: Conjunctivae and lids unremarkable. Cardiovascular: Regular rhythm.  Lungs: Normal work of breathing. Neurologic: No focal deficits.   Lab Results  Component Value Date   CREATININE 0.83 03/15/2020   BUN 10 03/15/2020   NA 140 03/15/2020   K 4.2 03/15/2020   CL 104 03/15/2020   CO2 24 03/15/2020   Lab Results  Component Value Date   ALT 11 03/15/2020   AST 12 03/15/2020   ALKPHOS 114 03/15/2020   BILITOT 0.3 03/15/2020   Lab Results  Component Value Date   HGBA1C 6.1 (H) 03/15/2020   HGBA1C 5.8 06/10/2017   Lab Results  Component Value Date   TSH 1.170 03/15/2020   Lab Results  Component Value Date   CHOL 154 03/15/2020   HDL 39 (L) 03/15/2020   LDLCALC 96 03/15/2020   TRIG 105 03/15/2020   CHOLHDL 3.9 03/15/2020   Lab Results  Component Value Date   WBC 12.6 (H) 03/15/2020   HGB 11.9 03/15/2020   HCT 40.4 03/15/2020   MCV 69 (L) 03/15/2020   PLT 376 03/15/2020   Lab Results  Component Value Date   IRON 48 03/15/2020   TIBC 434 03/15/2020   FERRITIN 70 03/15/2020   Attestation Statements:   This is the patient's first visit at [facility]. The patient's NEW PATIENT PACKET was reviewed at length. Included in the packet: current and past health history, medications, allergies, ROS, gynecologic history (women only), surgical history, family history, social history, weight history, weight loss surgery history (for those that have had weight loss surgery), nutritional evaluation, mood and food questionnaire, PHQ9, Epworth questionnaire, sleep habits questionnaire, patient life and health improvement goals questionnaire. These will all be scanned into the patient's chart under media.   During the visit, I independently reviewed the patient's EKG, bioimpedance scale results, and indirect calorimeter results. I used this information to tailor a meal plan  for the patient that will help her to lose weight and will improve her obesity-related conditions going forward. I performed a medically necessary appropriate examination and/or evaluation. I discussed the assessment and treatment plan with the patient. The patient was provided an opportunity to ask questions and all were answered. The patient agreed with the plan and demonstrated an understanding of the instructions. Labs were ordered at this visit and will be reviewed at the next visit unless more critical results need to be addressed immediately. Clinical information was updated and documented in the EMR.   I, Insurance claims handlerAmber Agner, CMA, am acting as Energy managertranscriptionist for Marsh & McLennanDeborah Denee Boeder, DO.  I have reviewed the above documentation for accuracy and completeness, and I agree with the  above. [name], D.O.  The 21st Century Cures Act was signed into law in 2016 which includes the topic of electronic health records.  This provides immediate access to information in MyChart.  This includes consultation notes, operative notes, office notes, lab results and pathology reports.  If you have any questions about what you read please let us know at your next visit so we can discuss your concerns and take corrective action if need be.  We are right here with you.

## 2020-06-09 LAB — COMPREHENSIVE METABOLIC PANEL
ALT: 13 IU/L (ref 0–32)
AST: 15 IU/L (ref 0–40)
Albumin/Globulin Ratio: 1.1 — ABNORMAL LOW (ref 1.2–2.2)
Albumin: 4 g/dL (ref 3.8–4.8)
Alkaline Phosphatase: 121 IU/L (ref 44–121)
BUN/Creatinine Ratio: 13 (ref 9–23)
BUN: 10 mg/dL (ref 6–20)
Bilirubin Total: 0.4 mg/dL (ref 0.0–1.2)
CO2: 19 mmol/L — ABNORMAL LOW (ref 20–29)
Calcium: 8.9 mg/dL (ref 8.7–10.2)
Chloride: 102 mmol/L (ref 96–106)
Creatinine, Ser: 0.76 mg/dL (ref 0.57–1.00)
GFR calc Af Amer: 114 mL/min/{1.73_m2} (ref >59)
GFR calc non Af Amer: 99 mL/min/{1.73_m2} (ref >59)
Globulin, Total: 3.6 g/dL (ref 1.5–4.5)
Glucose: 75 mg/dL (ref 65–99)
Potassium: 4.3 mmol/L (ref 3.5–5.2)
Sodium: 138 mmol/L (ref 134–144)
Total Protein: 7.6 g/dL (ref 6.0–8.5)

## 2020-06-09 LAB — IRON AND TIBC
Iron Saturation: 8 % — CL (ref 15–55)
Iron: 31 ug/dL (ref 27–159)
Total Iron Binding Capacity: 409 ug/dL (ref 250–450)
UIBC: 378 ug/dL (ref 131–425)

## 2020-06-09 LAB — CBC WITH DIFFERENTIAL/PLATELET
Basophils Absolute: 0.1 10*3/uL (ref 0.0–0.2)
Basos: 1 %
EOS (ABSOLUTE): 0.2 10*3/uL (ref 0.0–0.4)
Eos: 1 %
Hematocrit: 41.6 % (ref 34.0–46.6)
Hemoglobin: 12 g/dL (ref 11.1–15.9)
Immature Grans (Abs): 0.1 10*3/uL (ref 0.0–0.1)
Immature Granulocytes: 1 %
Lymphocytes Absolute: 3.3 10*3/uL — ABNORMAL HIGH (ref 0.7–3.1)
Lymphs: 24 %
MCH: 20.1 pg — ABNORMAL LOW (ref 26.6–33.0)
MCHC: 28.8 g/dL — ABNORMAL LOW (ref 31.5–35.7)
MCV: 70 fL — ABNORMAL LOW (ref 79–97)
Monocytes Absolute: 0.7 10*3/uL (ref 0.1–0.9)
Monocytes: 6 %
Neutrophils Absolute: 9.1 10*3/uL — ABNORMAL HIGH (ref 1.4–7.0)
Neutrophils: 67 %
Platelets: 408 10*3/uL (ref 150–450)
RBC: 5.96 x10E6/uL — ABNORMAL HIGH (ref 3.77–5.28)
RDW: 18.1 % — ABNORMAL HIGH (ref 11.7–15.4)
WBC: 13.5 10*3/uL — ABNORMAL HIGH (ref 3.4–10.8)

## 2020-06-09 LAB — HEMOGLOBIN A1C
Est. average glucose Bld gHb Est-mCnc: 126 mg/dL
Hgb A1c MFr Bld: 6 % — ABNORMAL HIGH (ref 4.8–5.6)

## 2020-06-09 LAB — VITAMIN D 25 HYDROXY (VIT D DEFICIENCY, FRACTURES): Vit D, 25-Hydroxy: 15.6 ng/mL — ABNORMAL LOW (ref 30.0–100.0)

## 2020-06-09 LAB — T4, FREE: Free T4: 1.21 ng/dL (ref 0.82–1.77)

## 2020-06-09 LAB — LIPID PANEL
Chol/HDL Ratio: 3.6 ratio (ref 0.0–4.4)
Cholesterol, Total: 155 mg/dL (ref 100–199)
HDL: 43 mg/dL (ref >39)
LDL Chol Calc (NIH): 96 mg/dL (ref 0–99)
Triglycerides: 81 mg/dL (ref 0–149)
VLDL Cholesterol Cal: 16 mg/dL (ref 5–40)

## 2020-06-09 LAB — TRANSFERRIN: Transferrin: 329 mg/dL (ref 192–364)

## 2020-06-09 LAB — INSULIN, RANDOM: INSULIN: 18.2 u[IU]/mL (ref 2.6–24.9)

## 2020-06-09 LAB — TSH: TSH: 1.3 u[IU]/mL (ref 0.450–4.500)

## 2020-06-09 LAB — T3: T3, Total: 155 ng/dL (ref 71–180)

## 2020-06-09 LAB — FERRITIN: Ferritin: 61 ng/mL (ref 15–150)

## 2020-06-09 LAB — VITAMIN B12: Vitamin B-12: 713 pg/mL (ref 232–1245)

## 2020-06-09 LAB — FOLATE: Folate: 8.1 ng/mL (ref >3.0)

## 2020-06-22 ENCOUNTER — Other Ambulatory Visit (HOSPITAL_COMMUNITY): Payer: Self-pay | Admitting: Family Medicine

## 2020-06-22 ENCOUNTER — Ambulatory Visit (INDEPENDENT_AMBULATORY_CARE_PROVIDER_SITE_OTHER): Payer: No Typology Code available for payment source | Admitting: Family Medicine

## 2020-06-22 ENCOUNTER — Encounter (INDEPENDENT_AMBULATORY_CARE_PROVIDER_SITE_OTHER): Payer: Self-pay | Admitting: Family Medicine

## 2020-06-22 ENCOUNTER — Other Ambulatory Visit: Payer: Self-pay

## 2020-06-22 VITALS — BP 116/76 | HR 81 | Temp 98.6°F | Ht 62.0 in | Wt 322.0 lb

## 2020-06-22 DIAGNOSIS — Z6841 Body Mass Index (BMI) 40.0 and over, adult: Secondary | ICD-10-CM | POA: Insufficient documentation

## 2020-06-22 DIAGNOSIS — E559 Vitamin D deficiency, unspecified: Secondary | ICD-10-CM | POA: Diagnosis not present

## 2020-06-22 DIAGNOSIS — D72829 Elevated white blood cell count, unspecified: Secondary | ICD-10-CM | POA: Insufficient documentation

## 2020-06-22 DIAGNOSIS — Z7189 Other specified counseling: Secondary | ICD-10-CM | POA: Insufficient documentation

## 2020-06-22 DIAGNOSIS — R7303 Prediabetes: Secondary | ICD-10-CM | POA: Diagnosis not present

## 2020-06-22 DIAGNOSIS — D508 Other iron deficiency anemias: Secondary | ICD-10-CM

## 2020-06-22 MED ORDER — VITAMIN D (ERGOCALCIFEROL) 1.25 MG (50000 UNIT) PO CAPS: 50000.0000 [IU] | ORAL_CAPSULE | ORAL | 0 refills | Status: DC

## 2020-06-22 MED ORDER — FERROUS SULFATE 325 (65 FE) MG PO TBEC: 325.0000 mg | DELAYED_RELEASE_TABLET | Freq: Two times a day (BID) | ORAL | 0 refills | Status: DC

## 2020-06-22 MED FILL — VIT D2 1.25 MG (50,000 UNIT: 1.25 MG | 28 days supply | Qty: 4 | Fill #0

## 2020-06-26 NOTE — Progress Notes (Addendum)
Chief Complaint:   OBESITY Donna Bond is here to discuss her progress with her obesity treatment plan along with follow-up of her obesity related diagnoses.   Today's visit was #: 2 Starting weight: 325 lbs Starting date: 06/08/2020 Today's weight: 322 lbs Today's date: 06/22/2020 Total lbs lost to date: 3 lbs Body mass index is 58.89 kg/m.  Total weight loss percentage to date: -0.92%  Interim History:   Donna Bond is here today to review her NEW Meal Plan and to discuss all recent labs done here and/ or done at outside facilities. This is patient's first follow up visit. Extended time was spent counseling Donna Bond on all new disease processes that were discovered or that are worsening.   Donna Bond says that she is drinking more than 100 ounces of water per day.  Plan:  Handouts on IR/prediabetes given today.  Eating Out, Food Assistance, and Hidden Calories handouts provided as well.  Current Meal Plan: the Category 3 Plan with breakfast options for 75% of the time.  Current Exercise Plan: None at this time.  Assessment/Plan:   No orders of the defined types were placed in this encounter.   Medications Discontinued During This Encounter  Medication Reason  . benzonatate (TESSALON) 100 MG capsule   . ferrous sulfate 325 (65 FE) MG EC tablet      Meds ordered this encounter  Medications  . DISCONTD: ferrous sulfate 325 (65 FE) MG EC tablet    Sig: Take 1 tablet (325 mg total) by mouth 2 (two) times daily with a meal.    Dispense:  60 tablet    Refill:  0  . ferrous sulfate 325 (65 FE) MG EC tablet    Sig: Take 1 tablet (325 mg total) by mouth 2 (two) times daily after a meal.    Dispense:  60 tablet    Refill:  0  . Vitamin D, Ergocalciferol, (DRISDOL) 1.25 MG (50000 UNIT) CAPS capsule    Sig: Take 1 capsule (50,000 Units total) by mouth every 7 (seven) days.    Dispense:  4 capsule    Refill:  0     1. Prediabetes New.  Discussed labs with patient today.  Not at goal.   Goal is HgbA1c < 5.7.  Medication: None.    Plan:  Handouts given.  Will consider medication in the future.  She will continue to focus on protein-rich, low simple carbohydrate foods. We reviewed the importance of hydration, regular exercise for stress reduction, and restorative sleep.   Lab Results  Component Value Date   HGBA1C 6.0 (H) 06/08/2020   Lab Results  Component Value Date   INSULIN 18.2 06/08/2020   2. Advice given about COVID-19 virus infection Elevated WBC count secondary to COVID infection.  Advice given today.  Counseling  COVID-19 is a respiratory infection that is caused by a virus. It can cause serious infections, such as pneumonia, acute respiratory distress syndrome, acute respiratory failure, or sepsis.  You are more likely to develop a serious illness if you are 40 years of age or older, have a weak immune system, live in a nursing home, have chronic disease, or have obesity.  Get vaccinated as soon as they are available to you.  For our most current information, please visit [email or web address]  Wash your hands often with soap and water for 20 seconds. If soap and water are not available, use alcohol-based hand sanitizer.  Wear a face mask. Make sure your mask covers your  nose and mouth.  Maintain at least 6 feet distance from others when in public.  Get help right away if  You have trouble breathing, chest pain, confusion, or other concerning symptoms.  3. Leukocytosis, unspecified type Worsening.  Discussed labs with patient today.  Had COVID at the end of January.  WBC count has been elevated since.    Plan:  Likely secondary to COVID infection.  Counseling done.  Recommend recheck in 3-6 months with PCP to ensure it is returning to normal limits.  4. Vitamin D deficiency Worsening.  Discussed labs with patient today.  Not at goal.  Current vitamin D is 15.6, tested on 06/08/2020. Optimal goal > 50 ng/dL.    Plan: Start to take  prescription Vitamin D @50 ,000 IU every week as prescribed.  Follow-up for routine testing of Vitamin D, at least 2-3 times per year to avoid over-replacement.  - Start Vitamin D, Ergocalciferol, (DRISDOL) 1.25 MG (50000 UNIT) CAPS capsule; Take 1 capsule (50,000 Units total) by mouth every 7 (seven) days.  Dispense: 4 capsule; Refill: 0  5. Other iron deficiency anemia Worsening.  Discussed labs with patient today.  She is not taking an iron supplement.  Plan:  Start ferrous sulfate 325 mg twice daily, as per below.  Nutrition: Iron-rich foods include dark leafy greens, red and white meats, eggs, seafood, and beans.  Certain foods and drinks prevent your body from absorbing iron properly. Avoid eating these foods in the same meal as iron-rich foods or with iron supplements. These foods include: coffee, black tea, and red wine; milk, dairy products, and foods that are high in calcium; beans and soybeans; whole grains. Constipation can be a side effect of iron supplementation. Increased water and fiber intake are helpful. Water goal: > 2 liters/day. Fiber goal: > 25 grams/day.  - Start ferrous sulfate 325 (65 FE) MG EC tablet; Take 1 tablet (325 mg total) by mouth 2 (two) times daily after a meal.  Dispense: 60 tablet; Refill: 0  6. Class 3 severe obesity with serious comorbidity and body mass index (BMI) of 50.0 to 59.9 in adult, unspecified obesity type [facility])  Course: Donna Bond is currently in the action stage of change. As such, her goal is to continue with weight loss efforts.   Nutrition goals: She has agreed to the Category 3 Plan with breakfast options.   Exercise goals: As is.  Behavioral modification strategies: increasing lean protein intake, decreasing simple carbohydrates, increasing water intake, decreasing sodium intake, no skipping meals, meal planning and cooking strategies, keeping healthy foods in the home, better snacking choices, avoiding temptations and planning for success.  Donna Bond  has agreed to follow-up with our clinic in 2 weeks. She was informed of the importance of frequent follow-up visits to maximize her success with intensive lifestyle modifications for her multiple health conditions.   Objective:   Blood pressure 116/76, pulse 81, temperature 98.6 F (37 C), height 5\' 2"  (1.575 m), weight (!) 322 lb (146.1 kg), SpO2 99 %. Body mass index is 58.89 kg/m.  General: Cooperative, alert, well developed, in no acute distress. HEENT: Conjunctivae and lids unremarkable. Cardiovascular: Regular rhythm.  Lungs: Normal work of breathing. Neurologic: No focal deficits.   Lab Results  Component Value Date   CREATININE 0.76 06/08/2020   BUN 10 06/08/2020   NA 138 06/08/2020   K 4.3 06/08/2020   CL 102 06/08/2020   CO2 19 (L) 06/08/2020   Lab Results  Component Value Date   ALT 13  06/08/2020   AST 15 06/08/2020   ALKPHOS 121 06/08/2020   BILITOT 0.4 06/08/2020   Lab Results  Component Value Date   HGBA1C 6.0 (H) 06/08/2020   HGBA1C 6.1 (H) 03/15/2020   HGBA1C 5.8 06/10/2017   Lab Results  Component Value Date   INSULIN 18.2 06/08/2020   Lab Results  Component Value Date   TSH 1.300 06/08/2020   Lab Results  Component Value Date   CHOL 155 06/08/2020   HDL 43 06/08/2020   LDLCALC 96 06/08/2020   TRIG 81 06/08/2020   CHOLHDL 3.6 06/08/2020   Lab Results  Component Value Date   WBC 13.5 (H) 06/08/2020   HGB 12.0 06/08/2020   HCT 41.6 06/08/2020   MCV 70 (L) 06/08/2020   PLT 408 06/08/2020   Lab Results  Component Value Date   IRON 31 06/08/2020   TIBC 409 06/08/2020   FERRITIN 61 06/08/2020   Attestation Statements:   Reviewed by clinician on day of visit: allergies, medications, problem list, medical history, surgical history, family history, social history, and previous encounter notes.  Time spent on visit including pre-visit chart review and post-visit care and charting was 55 minutes.   I, Insurance claims handler, CMA, am acting as  Energy manager for [employer], DO.  I have reviewed the above documentation for accuracy and completeness, and I agree with the above. [name], D.O.  The 21st Century Cures Act was signed into law in 2016 which includes the topic of electronic health records.  This provides immediate access to information in MyChart.  This includes consultation notes, operative notes, office notes, lab results and pathology reports.  If you have any questions about what you read please let us know at your next visit so we can discuss your concerns and take corrective action if need be.  We are right here with you.

## 2020-07-04 NOTE — Addendum Note (Signed)
Addended by: [name] on: 07/04/2020 10:19 PM   Modules accepted: Level of Service

## 2020-07-10 ENCOUNTER — Encounter (INDEPENDENT_AMBULATORY_CARE_PROVIDER_SITE_OTHER): Payer: Self-pay | Admitting: Family Medicine

## 2020-07-10 ENCOUNTER — Other Ambulatory Visit: Payer: Self-pay

## 2020-07-10 ENCOUNTER — Ambulatory Visit (INDEPENDENT_AMBULATORY_CARE_PROVIDER_SITE_OTHER): Payer: No Typology Code available for payment source | Admitting: Family Medicine

## 2020-07-10 VITALS — BP 127/79 | HR 80 | Temp 98.3°F | Ht 62.0 in | Wt 317.0 lb

## 2020-07-10 DIAGNOSIS — Z6841 Body Mass Index (BMI) 40.0 and over, adult: Secondary | ICD-10-CM

## 2020-07-10 DIAGNOSIS — D508 Other iron deficiency anemias: Secondary | ICD-10-CM

## 2020-07-10 DIAGNOSIS — E559 Vitamin D deficiency, unspecified: Secondary | ICD-10-CM | POA: Diagnosis not present

## 2020-07-10 DIAGNOSIS — Z9189 Other specified personal risk factors, not elsewhere classified: Secondary | ICD-10-CM

## 2020-07-10 DIAGNOSIS — D649 Anemia, unspecified: Secondary | ICD-10-CM | POA: Insufficient documentation

## 2020-07-10 MED ORDER — VITAMIN D (ERGOCALCIFEROL) 1.25 MG (50000 UNIT) PO CAPS: 50000.0000 [IU] | ORAL_CAPSULE | ORAL | 0 refills | Status: DC

## 2020-07-17 NOTE — Progress Notes (Signed)
Chief Complaint:   OBESITY Donna Bond is here to discuss her progress with her obesity treatment plan along with follow-up of her obesity related diagnoses.   Today's visit was #: 3 Starting weight: 325 lbs Starting date: 06/08/2020 Today's weight: 317 lbs Today's date: 07/10/2020 Total lbs lost to date: 8 lbs Body mass index is 57.98 kg/m.  Total weight loss percentage to date: -2.46%  Interim History:  Donna Bond is here for a follow up office visit and she is following the meal plan without concerns or issues.  Patient's meal and food recall appears to be accurate and consistent with what is on the plan.  When on plan, her hunger and cravings are well controlled.    Donna Bond eats out 3-4 times per week still, but is making better choices than she was before.  Current Meal Plan: the Category 3 Plan with breakfast options.  Current Exercise Plan: None.  Assessment/Plan:   No orders of the defined types were placed in this encounter.   Medications Discontinued During This Encounter  Medication Reason  . Vitamin D, Ergocalciferol, (DRISDOL) 1.25 MG (50000 UNIT) CAPS capsule Reorder     Meds ordered this encounter  Medications  . Vitamin D, Ergocalciferol, (DRISDOL) 1.25 MG (50000 UNIT) CAPS capsule    Sig: Take 1 capsule (50,000 Units total) by mouth every 7 (seven) days.    Dispense:  4 capsule    Refill:  0     1. Other iron deficiency anemia [name] started ferrous sulfate at last office visits.  Tolerating well.  She is at least taking it once daily.  Plan:  Try to take twice daily as written.  Strategies for medication obedience discussed with her today.  Nutrition: Iron-rich foods include dark leafy greens, red and white meats, eggs, seafood, and beans.  Certain foods and drinks prevent your body from absorbing iron properly. Avoid eating these foods in the same meal as iron-rich foods or with iron supplements. These foods include: coffee, black tea, and red wine; milk, dairy  products, and foods that are high in calcium; beans and soybeans; whole grains. Constipation can be a side effect of iron supplementation. Increased water and fiber intake are helpful. Water goal: > 2 liters/day. Fiber goal: > 25 grams/day.  CBC Latest Ref Rng & Units 06/08/2020 03/15/2020 06/10/2017  WBC 3.4 - 10.8 x10E3/uL 13.5(H) 12.6(H) 10.7  Hemoglobin 11.1 - 15.9 g/dL 67.8 93.8 10.1  Hematocrit 34.0 - 46.6 % 41.6 40.4 37.9  Platelets 150 - 450 x10E3/uL 408 376 314   Lab Results  Component Value Date   IRON 31 06/08/2020   TIBC 409 06/08/2020   FERRITIN 61 06/08/2020   Lab Results  Component Value Date   VITAMINB12 713 06/08/2020   2. Vitamin D deficiency Not at goal. Current vitamin D is 15.6, tested on 06/08/2020. Optimal goal > 50 ng/dL.  She is taking vitamin D 50,000 IU weekly.  Started vitamin D supplement at last office visit.  Tolerating well without side effects.  Plan: Continue to take prescription Vitamin D @50 ,000 IU every week as prescribed.  Follow-up for routine testing of Vitamin D, at least 2-3 times per year to avoid over-replacement.  - Refill Vitamin D, Ergocalciferol, (DRISDOL) 1.25 MG (50000 UNIT) CAPS capsule; Take 1 capsule (50,000 Units total) by mouth every 7 (seven) days.  Dispense: 4 capsule; Refill: 0  3. At risk for malnutrition Donna Bond was given extensive malnutrition prevention education and counseling today of more than  8 minutes.  Counseled her that malnutrition refers to inappropriate nutrients or not the right balance of nutrients for optimal health.  Discussed with Donna Bond that it is absolutely possible to be malnourished but yet obese.  Risk factors, including but not limited to, inappropriate dietary choices, difficulty with obtaining food due to physical or financial limitations, and various physical and mental health conditions were reviewed with [facility].  4. Obesity, current BMI 58.0  Course: [name] is currently in the action stage of change.  As such, her goal is to continue with weight loss efforts.   Nutrition goals: She has agreed to the Category 3 Plan.   Exercise goals: As is.  Behavioral modification strategies: increasing lean protein intake, decreasing simple carbohydrates, meal planning and cooking strategies and planning for success.  [name] has agreed to follow-up with our clinic in 2 weeks. She was informed of the importance of frequent follow-up visits to maximize her success with intensive lifestyle modifications for her multiple health conditions.   Objective:   Blood pressure 127/79, pulse 80, temperature 98.3 F (36.8 C), height 5\' 2"  (1.575 m), weight (!) 317 lb (143.8 kg), SpO2 100 %. Body mass index is 57.98 kg/m.  General: Cooperative, alert, well developed, in no acute distress. HEENT: Conjunctivae and lids unremarkable. Cardiovascular: Regular rhythm.  Lungs: Normal work of breathing. Neurologic: No focal deficits.   Lab Results  Component Value Date   CREATININE 0.76 06/08/2020   BUN 10 06/08/2020   NA 138 06/08/2020   K 4.3 06/08/2020   CL 102 06/08/2020   CO2 19 (L) 06/08/2020   Lab Results  Component Value Date   ALT 13 06/08/2020   AST 15 06/08/2020   ALKPHOS 121 06/08/2020   BILITOT 0.4 06/08/2020   Lab Results  Component Value Date   HGBA1C 6.0 (H) 06/08/2020   HGBA1C 6.1 (H) 03/15/2020   HGBA1C 5.8 06/10/2017   Lab Results  Component Value Date   INSULIN 18.2 06/08/2020   Lab Results  Component Value Date   TSH 1.300 06/08/2020   Lab Results  Component Value Date   CHOL 155 06/08/2020   HDL 43 06/08/2020   LDLCALC 96 06/08/2020   TRIG 81 06/08/2020   CHOLHDL 3.6 06/08/2020   Lab Results  Component Value Date   WBC 13.5 (H) 06/08/2020   HGB 12.0 06/08/2020   HCT 41.6 06/08/2020   MCV 70 (L) 06/08/2020   PLT 408 06/08/2020   Lab Results  Component Value Date   IRON 31 06/08/2020   TIBC 409 06/08/2020   FERRITIN 61 06/08/2020   Attestation Statements:    Reviewed by clinician on day of visit: allergies, medications, problem list, medical history, surgical history, family history, social history, and previous encounter notes.  I, 06/10/2020, CMA, am acting as Insurance claims handler for Energy manager, DO.  I have reviewed the above documentation for accuracy and completeness, and I agree with the above. [employer], D.O.  The 21st Century Cures Act was signed into law in 2016 which includes the topic of electronic health records.  This provides immediate access to information in MyChart.  This includes consultation notes, operative notes, office notes, lab results and pathology reports.  If you have any questions about what you read please let 2017 know at your next visit so we can discuss your concerns and take corrective action if need be.  We are right here with you.  No orders of the defined types were placed  in this encounter.   Medications Discontinued During This Encounter  Medication Reason  . Vitamin D, Ergocalciferol, (DRISDOL) 1.25 MG (50000 UNIT) CAPS capsule Reorder     Meds ordered this encounter  Medications  . Vitamin D, Ergocalciferol, (DRISDOL) 1.25 MG (50000 UNIT) CAPS capsule    Sig: Take 1 capsule (50,000 Units total) by mouth every 7 (seven) days.    Dispense:  4 capsule    Refill:  0

## 2020-07-27 ENCOUNTER — Ambulatory Visit (INDEPENDENT_AMBULATORY_CARE_PROVIDER_SITE_OTHER): Payer: No Typology Code available for payment source | Admitting: Family Medicine

## 2020-07-27 ENCOUNTER — Encounter (INDEPENDENT_AMBULATORY_CARE_PROVIDER_SITE_OTHER): Payer: Self-pay | Admitting: Family Medicine

## 2020-07-27 ENCOUNTER — Other Ambulatory Visit (HOSPITAL_COMMUNITY): Payer: Self-pay

## 2020-07-27 ENCOUNTER — Other Ambulatory Visit: Payer: Self-pay

## 2020-07-27 VITALS — BP 130/85 | HR 89 | Temp 98.3°F | Ht 62.0 in | Wt 314.0 lb

## 2020-07-27 DIAGNOSIS — E559 Vitamin D deficiency, unspecified: Secondary | ICD-10-CM

## 2020-07-27 DIAGNOSIS — Z9189 Other specified personal risk factors, not elsewhere classified: Secondary | ICD-10-CM

## 2020-07-27 DIAGNOSIS — Z6841 Body Mass Index (BMI) 40.0 and over, adult: Secondary | ICD-10-CM

## 2020-07-27 DIAGNOSIS — K5903 Drug induced constipation: Secondary | ICD-10-CM | POA: Diagnosis not present

## 2020-07-27 DIAGNOSIS — D508 Other iron deficiency anemias: Secondary | ICD-10-CM

## 2020-07-27 MED ORDER — POLYETHYLENE GLYCOL 3350 17 G PO PACK
17.0000 g | PACK | Freq: Two times a day (BID) | ORAL | 0 refills | Status: DC
  Filled 2020-07-27: qty 56, 28d supply, fill #0

## 2020-07-27 MED ORDER — VITAMIN D (ERGOCALCIFEROL) 1.25 MG (50000 UNIT) PO CAPS
1.0000 | ORAL_CAPSULE | ORAL | 0 refills | Status: DC
  Filled 2020-07-27: qty 4, 28d supply, fill #0

## 2020-07-28 ENCOUNTER — Other Ambulatory Visit (HOSPITAL_COMMUNITY): Payer: Self-pay

## 2020-08-01 NOTE — Progress Notes (Signed)
Chief Complaint:   OBESITY Donna Bond is here to discuss her progress with her obesity treatment plan along with follow-up of her obesity related diagnoses.   Today's visit was #: 4 Starting weight: 325 lbs Starting date: 06/08/2020 Today's weight: 314 lbs Today's date: 07/27/2020 Total lbs lost to date: 11 lbs Body mass index is 57.43 kg/m.  Total weight loss percentage to date: -3.38%  Interim History:  Donna Bond is here for a follow up office visit and she is following the meal plan without concerns or issues.  Patient's meal and food recall appears to be accurate and consistent with what is on the plan.  When on plan, her hunger and cravings are well controlled.    Current Meal Plan: the Category 3 Plan for 75% of the time.  Current Exercise Plan: None.  Assessment/Plan:   No orders of the defined types were placed in this encounter.   Medications Discontinued During This Encounter  Medication Reason  . ferrous sulfate 325 (65 FE) MG EC tablet Error  . Vitamin D, Ergocalciferol, (DRISDOL) 1.25 MG (50000 UNIT) CAPS capsule   . ferrous sulfate 325 (65 FE) MG EC tablet Error  . loratadine-pseudoephedrine (CLARITIN-D 24-HOUR) 10-240 MG 24 hr tablet Error  . Vitamin D, Ergocalciferol, (DRISDOL) 1.25 MG (50000 UNIT) CAPS capsule      Meds ordered this encounter  Medications  . Vitamin D, Ergocalciferol, (DRISDOL) 1.25 MG (50000 UNIT) CAPS capsule    Sig: Take 1 capsule (50,000 Units total) by mouth every 7 (seven) days.    Dispense:  4 capsule    Refill:  0    Needs ov for RF  . polyethylene glycol (MIRALAX / GLYCOLAX) 17 g packet    Sig: Take 17 g by mouth 2 (two) times daily. Until stooling regularly, then use as needed    Dispense:  60 packet    Refill:  0     1. Vitamin D deficiency Not at goal. Current vitamin D is 15.6, tested on 06/08/2020. Optimal goal > 50 ng/dL.  She is taking vitamin D 50,000 IU weekly.  Plan: Continue to take prescription Vitamin D @50 ,000  IU every week as prescribed.  Follow-up for routine testing of Vitamin D, at least 2-3 times per year to avoid over-replacement.  - Refill Vitamin D, Ergocalciferol, (DRISDOL) 1.25 MG (50000 UNIT) CAPS capsule; Take 1 capsule (50,000 Units total) by mouth every 7 (seven) days.  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 Drink at least 8 glasses of water each day. Eat plenty of fiber (goal is over 30 grams each day). It is best to get most of your fiber from dietary sources which includes leafy green vegetables, fresh fruit, and whole grains. You may need to add fiber with the help of OTC fiber supplements. These include Metamucil, Citrucel, and Benefiber. If you are still having trouble, try adding an osmotic laxative such as Miralax. If all of these changes do not work, .  Plan:  Start MiraLAX twice daily until stools are regularly, then take daily/as needed along with iron supplement.   - Start polyethylene glycol (MIRALAX / GLYCOLAX) 17 g packet; Take 17 g by mouth 2 (two) times daily. Until stooling regularly, then use as needed  Dispense: 60 packet; Refill: 0  3. Other iron deficiency anemia Donna Bond is taking an iron supplement 325 mg daily.  Plan:  Continue daily iron supplement.    Nutrition: Iron-rich foods include dark leafy greens, red and white meats, eggs, seafood, and beans.  Certain foods and drinks prevent your body from absorbing iron properly. Avoid eating these foods in the same meal as iron-rich foods or with iron supplements. These foods include: coffee, black tea, and red wine; milk, dairy products, and foods that are high in calcium; beans and soybeans; whole grains. Constipation can be a side effect of iron supplementation. Increased water and fiber intake are helpful. Water goal: > 2 liters/day. Fiber goal: > 25 grams/day.  CBC Latest Ref Rng & Units 06/08/2020  03/15/2020 06/10/2017  WBC 3.4 - 10.8 x10E3/uL 13.5(H) 12.6(H) 10.7  Hemoglobin 11.1 - 15.9 g/dL 56.4 33.2 95.1  Hematocrit 34.0 - 46.6 % 41.6 40.4 37.9  Platelets 150 - 450 x10E3/uL 408 376 314   Lab Results  Component Value Date   IRON 31 06/08/2020   TIBC 409 06/08/2020   FERRITIN 61 06/08/2020   Lab Results  Component Value Date   VITAMINB12 713 06/08/2020   4. At risk for adverse drug reaction Due to [facility] current conditions and medications, she is at a higher risk for drug side effect.  At least 9 minutes was spent on counseling her about these concerns today.  We discussed the benefits and potential risks of these medications, and all of patient's concerns were addressed and questions were answered.  she will call us, or their PCP or other specialists who treat their conditions with medications, with any questions or concerns that may develop.    5. Class 3 severe obesity with serious comorbidity and body mass index (BMI) of 50.0 to 59.9 in adult, unspecified obesity type [facility])  Course: Donna Bond is currently in the action stage of change. As such, her goal is to continue with weight loss efforts.   Nutrition goals: She has agreed to the Category 3 Plan.   Exercise goals: As is.  Behavioral modification strategies: increasing lean protein intake, decreasing simple carbohydrates, increasing water intake, decreasing eating out, emotional eating strategies and planning for success.  [name] has agreed to follow-up with our clinic in 3 weeks with Donna Cliche, PA-C, and then in another 3 weeks with me. She was informed of the importance of frequent follow-up visits to maximize her success with intensive lifestyle modifications for her multiple health conditions.   Objective:   Blood pressure 130/85, pulse 89, temperature 98.3 F (36.8 C), height 5\' 2"  (1.575 m), weight (!) 314 lb (142.4 kg), SpO2 99 %. Body mass index is 57.43 kg/m.  General: Cooperative, alert, well developed, in no  acute distress. HEENT: Conjunctivae and lids unremarkable. Cardiovascular: Regular rhythm.  Lungs: Normal work of breathing. Neurologic: No focal deficits.   Lab Results  Component Value Date   CREATININE 0.76 06/08/2020   BUN 10 06/08/2020   NA 138 06/08/2020   K 4.3 06/08/2020   CL 102 06/08/2020   CO2 19 (L) 06/08/2020   Lab Results  Component Value Date   ALT 13 06/08/2020   AST 15 06/08/2020   ALKPHOS 121 06/08/2020   BILITOT 0.4 06/08/2020   Lab Results  Component Value Date   HGBA1C 6.0 (H) 06/08/2020   HGBA1C 6.1 (H) 03/15/2020   HGBA1C 5.8 06/10/2017   Lab Results  Component Value Date   INSULIN 18.2 06/08/2020   Lab Results  Component Value Date   TSH 1.300 06/08/2020   Lab Results  Component Value Date   CHOL 155  06/08/2020   HDL 43 06/08/2020   LDLCALC 96 06/08/2020   TRIG 81 06/08/2020   CHOLHDL 3.6 06/08/2020   Lab Results  Component Value Date   WBC 13.5 (H) 06/08/2020   HGB 12.0 06/08/2020   HCT 41.6 06/08/2020   MCV 70 (L) 06/08/2020   PLT 408 06/08/2020   Lab Results  Component Value Date   IRON 31 06/08/2020   TIBC 409 06/08/2020   FERRITIN 61 06/08/2020   Attestation Statements:   Reviewed by clinician on day of visit: allergies, medications, problem list, medical history, surgical history, family history, social history, and previous encounter notes.  I, Insurance claims handler, CMA, am acting as Energy manager for [employer], DO.  I have reviewed the above documentation for accuracy and completeness, and I agree with the above. [name], D.O.  The 21st Century Cures Act was signed into law in 2016 which includes the topic of electronic health records.  This provides immediate access to information in MyChart.  This includes consultation notes, operative notes, office notes, lab results and pathology reports.  If you have any questions about what you read please let us know at your next visit so we can discuss your concerns  and take corrective action if need be.  We are right here with you.

## 2020-08-08 ENCOUNTER — Other Ambulatory Visit (HOSPITAL_COMMUNITY): Payer: Self-pay

## 2020-08-15 ENCOUNTER — Ambulatory Visit (INDEPENDENT_AMBULATORY_CARE_PROVIDER_SITE_OTHER): Payer: No Typology Code available for payment source | Admitting: Physician Assistant

## 2020-08-29 ENCOUNTER — Other Ambulatory Visit (HOSPITAL_COMMUNITY): Payer: Self-pay

## 2020-08-29 ENCOUNTER — Ambulatory Visit (INDEPENDENT_AMBULATORY_CARE_PROVIDER_SITE_OTHER): Payer: No Typology Code available for payment source | Admitting: Family Medicine

## 2020-08-29 ENCOUNTER — Encounter (INDEPENDENT_AMBULATORY_CARE_PROVIDER_SITE_OTHER): Payer: Self-pay | Admitting: Family Medicine

## 2020-08-29 ENCOUNTER — Other Ambulatory Visit: Payer: Self-pay

## 2020-08-29 VITALS — BP 121/78 | HR 83 | Temp 98.2°F | Ht 62.0 in | Wt 307.0 lb

## 2020-08-29 DIAGNOSIS — Z9189 Other specified personal risk factors, not elsewhere classified: Secondary | ICD-10-CM

## 2020-08-29 DIAGNOSIS — D508 Other iron deficiency anemias: Secondary | ICD-10-CM | POA: Diagnosis not present

## 2020-08-29 DIAGNOSIS — Z6841 Body Mass Index (BMI) 40.0 and over, adult: Secondary | ICD-10-CM

## 2020-08-29 DIAGNOSIS — E559 Vitamin D deficiency, unspecified: Secondary | ICD-10-CM | POA: Diagnosis not present

## 2020-08-29 MED ORDER — VITAMIN D (ERGOCALCIFEROL) 1.25 MG (50000 UNIT) PO CAPS
1.0000 | ORAL_CAPSULE | ORAL | 0 refills | Status: DC
  Filled 2020-08-29: qty 4, 28d supply, fill #0

## 2020-08-30 ENCOUNTER — Other Ambulatory Visit (HOSPITAL_COMMUNITY): Payer: Self-pay

## 2020-08-30 MED ORDER — SM LORATA-DINE D 10-240 MG PO TB24
1.0000 | ORAL_TABLET | Freq: Every day | ORAL | 3 refills | Status: DC
  Filled 2020-08-30: qty 30, 30d supply, fill #0
  Filled 2020-11-06: qty 90, 90d supply, fill #0

## 2020-08-31 ENCOUNTER — Other Ambulatory Visit (HOSPITAL_COMMUNITY): Payer: Self-pay

## 2020-09-05 NOTE — Progress Notes (Signed)
Chief Complaint:   OBESITY Donna Bond is here to discuss her progress with her obesity treatment plan along with follow-up of her obesity related diagnoses.   Today's visit was #: 5 Starting weight: 325 lbs Starting date: 06/08/2020 Today's weight: 307 lbs Today's date: 08/29/2020 Weight change since last visit: 7 lbs Total lbs lost to date: 18 lbs Body mass index is 56.15 kg/m.  Total weight loss percentage to date: -5.54%  Interim History:  Donna Bond went on vacation for 5 days and still lost 7 pounds today.  She splurged on vacation and took a little bit to get on track, but over the past 2 weeks she has been following the plan.  Her last office visit was 1 month ago.  Using air fryer now.  No hunger.  Snacks on [place] bars, [place], and [place].  At times, not using snack calories.  Current Meal Plan: the Category 3 Plan for 65% of the time.  Current Exercise Plan: None.  Assessment/Plan:   Medications Discontinued During This Encounter  Medication Reason  . Vitamin D, Ergocalciferol, (DRISDOL) 1.25 MG (50000 UNIT) CAPS capsule Reorder    Meds ordered this encounter  Medications  . Vitamin D, Ergocalciferol, (DRISDOL) 1.25 MG (50000 UNIT) CAPS capsule    Sig: Take 1 capsule (50,000 Units total) by mouth every 7 (seven) days.    Dispense:  4 capsule    Refill:  0    Needs ov for RF      1. Vitamin D deficiency Not at goal. Current vitamin D is 15.6, tested on 06/08/2020. Optimal goal > 50 ng/dL.  She is taking vitamin D 50,000 IU weekly.  Plan: Continue to take prescription Vitamin D @50 ,000 IU every week as prescribed.  Follow-up for routine testing of Vitamin D, at least 2-3 times per year to avoid over-replacement.  - Refill Vitamin D, Ergocalciferol, (DRISDOL) 1.25 MG (50000 UNIT) CAPS capsule; Take 1 capsule (50,000 Units total) by mouth every 7 (seven) days.  Dispense: 4 capsule; Refill: 0    2. Other iron deficiency anemia She has not been taking her iron  supplement.  Plan:  Take iron supplement daily.  Nutrition: Iron-rich foods include dark leafy greens, red and white meats, eggs, seafood, and beans.  Certain foods and drinks prevent your body from absorbing iron properly. Avoid eating these foods in the same meal as iron-rich foods or with iron supplements. These foods include: coffee, black tea, and red wine; milk, dairy products, and foods that are high in calcium; beans and soybeans; whole grains. Constipation can be a side effect of iron supplementation. Increased water and fiber intake are helpful. Water goal: > 2 liters/day. Fiber goal: > 25 grams/day.  CBC Latest Ref Rng & Units 06/08/2020 03/15/2020 06/10/2017  WBC 3.4 - 10.8 x10E3/uL 13.5(H) 12.6(H) 10.7  Hemoglobin 11.1 - 15.9 g/dL 06/12/2017 16.1 09.6  Hematocrit 34.0 - 46.6 % 41.6 40.4 37.9  Platelets 150 - 450 x10E3/uL 408 376 314   Lab Results  Component Value Date   IRON 31 06/08/2020   TIBC 409 06/08/2020   FERRITIN 61 06/08/2020   Lab Results  Component Value Date   VITAMINB12 713 06/08/2020     3. At risk for impaired metabolic function Due to Donna Bond's current state of health and medical condition(s), she is at a significantly higher risk for impaired metabolic function.   At least 9 minutes was spent on counseling Donna Bond about these concerns today.  This places the patient  at a much greater risk to subsequently develop cardio-pulmonary conditions that can negatively affect the patient's quality of life.  I stressed the importance of reversing these risks factors.  The initial goal is to lose at least 5-10% of starting weight to help reduce risk factors.  Counseling:  Intensive lifestyle modifications discussed with Donna Bond as the most appropriate first line treatment.  she will continue to work on diet, exercise, and weight loss efforts.  We will continue to reassess these conditions on a fairly regular basis in an attempt to decrease the patient's overall morbidity and  mortality.    4. Obesity, current BMI 56.3  Course: Donna Bond is currently in the action stage of change. As such, her goal is to continue with weight loss efforts.   Nutrition goals: She has agreed to the Category 3 Plan.   Exercise goals: As is.  Behavioral modification strategies: increasing lean protein intake, decreasing simple carbohydrates, no skipping meals and planning for success.  Donna Bond has agreed to follow-up with our clinic in 2-3 weeks. She was informed of the importance of frequent follow-up visits to maximize her success with intensive lifestyle modifications for her multiple health conditions.     Objective:   Blood pressure 121/78, pulse 83, temperature 98.2 F (36.8 C), height 5\' 2"  (1.575 m), weight (!) 307 lb (139.3 kg), SpO2 98 %. Body mass index is 56.15 kg/m.  General: Cooperative, alert, well developed, in no acute distress. HEENT: Conjunctivae and lids unremarkable. Cardiovascular: Regular rhythm.  Lungs: Normal work of breathing. Neurologic: No focal deficits.   Lab Results  Component Value Date   CREATININE 0.76 06/08/2020   BUN 10 06/08/2020   NA 138 06/08/2020   K 4.3 06/08/2020   CL 102 06/08/2020   CO2 19 (L) 06/08/2020   Lab Results  Component Value Date   ALT 13 06/08/2020   AST 15 06/08/2020   ALKPHOS 121 06/08/2020   BILITOT 0.4 06/08/2020   Lab Results  Component Value Date   HGBA1C 6.0 (H) 06/08/2020   HGBA1C 6.1 (H) 03/15/2020   HGBA1C 5.8 06/10/2017   Lab Results  Component Value Date   INSULIN 18.2 06/08/2020   Lab Results  Component Value Date   TSH 1.300 06/08/2020   Lab Results  Component Value Date   CHOL 155 06/08/2020   HDL 43 06/08/2020   LDLCALC 96 06/08/2020   TRIG 81 06/08/2020   CHOLHDL 3.6 06/08/2020   Lab Results  Component Value Date   WBC 13.5 (H) 06/08/2020   HGB 12.0 06/08/2020   HCT 41.6 06/08/2020   MCV 70 (L) 06/08/2020   PLT 408 06/08/2020   Lab Results  Component Value Date   IRON 31  06/08/2020   TIBC 409 06/08/2020   FERRITIN 61 06/08/2020   Attestation Statements:   Reviewed by clinician on day of visit: allergies, medications, problem list, medical history, surgical history, family history, social history, and previous encounter notes.  I, 06/10/2020, CMA, am acting as Insurance claims handler for Energy manager, DO.  I have reviewed the above documentation for accuracy and completeness, and I agree with the above. [employer], D.O.  The 21st Century Cures Act was signed into law in 2016 which includes the topic of electronic health records.  This provides immediate access to information in MyChart.  This includes consultation notes, operative notes, office notes, lab results and pathology reports.  If you have any questions about what you read please let 2017 know at  your next visit so we can discuss your concerns and take corrective action if need be.  We are right here with you.

## 2020-09-07 ENCOUNTER — Other Ambulatory Visit (HOSPITAL_COMMUNITY): Payer: Self-pay

## 2020-09-08 ENCOUNTER — Other Ambulatory Visit (HOSPITAL_COMMUNITY): Payer: Self-pay

## 2020-09-19 ENCOUNTER — Other Ambulatory Visit: Payer: Self-pay

## 2020-09-19 ENCOUNTER — Ambulatory Visit (INDEPENDENT_AMBULATORY_CARE_PROVIDER_SITE_OTHER): Payer: No Typology Code available for payment source | Admitting: Physician Assistant

## 2020-09-19 ENCOUNTER — Other Ambulatory Visit (HOSPITAL_COMMUNITY): Payer: Self-pay

## 2020-09-19 ENCOUNTER — Encounter (INDEPENDENT_AMBULATORY_CARE_PROVIDER_SITE_OTHER): Payer: Self-pay | Admitting: Physician Assistant

## 2020-09-19 VITALS — BP 128/80 | HR 76 | Temp 98.3°F | Ht 62.0 in | Wt 309.0 lb

## 2020-09-19 DIAGNOSIS — K5903 Drug induced constipation: Secondary | ICD-10-CM | POA: Diagnosis not present

## 2020-09-19 DIAGNOSIS — F3289 Other specified depressive episodes: Secondary | ICD-10-CM | POA: Diagnosis not present

## 2020-09-19 DIAGNOSIS — Z6841 Body Mass Index (BMI) 40.0 and over, adult: Secondary | ICD-10-CM | POA: Diagnosis not present

## 2020-09-19 DIAGNOSIS — Z9189 Other specified personal risk factors, not elsewhere classified: Secondary | ICD-10-CM | POA: Diagnosis not present

## 2020-09-19 MED ORDER — POLYETHYLENE GLYCOL 3350 17 G PO PACK
17.0000 g | PACK | Freq: Two times a day (BID) | ORAL | 0 refills | Status: DC
  Filled 2020-09-19: qty 60, 30d supply, fill #0

## 2020-09-19 MED ORDER — BUPROPION HCL ER (SR) 150 MG PO TB12
150.0000 mg | ORAL_TABLET | Freq: Every morning | ORAL | 0 refills | Status: DC
  Filled 2020-09-19: qty 30, 30d supply, fill #0

## 2020-09-20 NOTE — Progress Notes (Signed)
Chief Complaint:   OBESITY Donna Bond is here to discuss her progress with her obesity treatment plan along with follow-up of her obesity related diagnoses. Donna Bond is on the Category 3 Plan and states she is following her eating plan approximately 40% of the time. Donna Bond states she is walking for 30 minutes 2 times per week.  Today's visit was #: 6 Starting weight: 325 lbs Starting date: 06/08/2020 Today's weight: 309 lbs Today's date: 09/19/2020 Total lbs lost to date: 16 Total lbs lost since last in-office visit: 0  Interim History: Donna Bond reports that she had many celebrations over the last 2 weeks and indulged in pound cake and birthday cake. She is having some cravings throughout the day and she notes that she can be an emotional eater.  Subjective:   1. 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 She denies blood in her stool.  2. Other depression, with emotional eating Donna Bond is not on medications, and she reports cravings.  3. At risk for nausea Donna Bond is at risk for nausea due to constipation.  Assessment/Plan:   1. 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 We will refill miralax. Orders and follow up as documented in patient record.   Counseling Getting to Good Bowel Health: Your goal is to have one soft bowel movement each day. Drink at least 8 glasses of water each day. Eat plenty of fiber (goal is over 25 grams each day). It is best to get most of your fiber from dietary sources which includes leafy green vegetables, fresh fruit, and whole grains. You may need to add fiber with the help of OTC fiber supplements. These include Metamucil, Citrucel, and Flaxseed. If you are still having trouble, try adding Miralax or Magnesium Citrate. If all of these changes do not work, Dietitian.  -  polyethylene glycol (MIRALAX / GLYCOLAX) 17 g packet; Mix and take 17 g by mouth 2 (two) times daily. Until stooling regularly, then use as needed  Dispense: 60 packet; Refill: 0  2. Other depression, with emotional eating Behavior modification techniques were discussed today to help Donna Bond deal with her emotional/non-hunger eating behaviors. Donna Bond agreed to start Wellbutrin SR 150 mg q AM with no refills. Orders and follow up as documented in patient record.   - buPROPion (WELLBUTRIN SR) 150 MG 12 hr tablet; Take 1 tablet (150 mg total) by mouth in the morning.  Dispense: 30 tablet; Refill: 0  3. At risk for nausea Donna Bond was given approximately 15 minutes of nausea prevention counseling today. Donna Bond is at risk for nausea due to her new or current medication. She was encouraged to titrate her medication slowly, make sure to stay hydrated, eat smaller portions throughout the day, and avoid high fat meals.   4. Class 3 severe obesity with serious comorbidity and body mass index (BMI) of 50.0 to 59.9 in adult, unspecified obesity type (HCC) Donna Bond is currently in the action stage of change. As such, her goal is to continue with weight loss efforts. She has agreed to the Category 3 Plan.   Exercise goals: As is.  Behavioral modification strategies: meal planning and cooking strategies and keeping healthy foods in the home.  Donna Bond has agreed to follow-up with our clinic in 2 weeks. She was informed of the importance of frequent follow-up visits to maximize  her success with intensive lifestyle modifications for her multiple health conditions.   Objective:   Blood pressure 128/80, pulse 76, temperature 98.3 F (36.8 C), height 5\' 2"  (1.575 m), weight (!) 309 lb (140.2 kg), SpO2 99 %. Body mass index is 56.52 kg/m.  General: Cooperative, alert, well developed, in no acute distress. HEENT: Conjunctivae and lids unremarkable. Cardiovascular: Regular rhythm.  Lungs: Normal work of breathing. Neurologic:  No focal deficits.   Lab Results  Component Value Date   CREATININE 0.76 06/08/2020   BUN 10 06/08/2020   NA 138 06/08/2020   K 4.3 06/08/2020   CL 102 06/08/2020   CO2 19 (L) 06/08/2020   Lab Results  Component Value Date   ALT 13 06/08/2020   AST 15 06/08/2020   ALKPHOS 121 06/08/2020   BILITOT 0.4 06/08/2020   Lab Results  Component Value Date   HGBA1C 6.0 (H) 06/08/2020   HGBA1C 6.1 (H) 03/15/2020   HGBA1C 5.8 06/10/2017   Lab Results  Component Value Date   INSULIN 18.2 06/08/2020   Lab Results  Component Value Date   TSH 1.300 06/08/2020   Lab Results  Component Value Date   CHOL 155 06/08/2020   HDL 43 06/08/2020   LDLCALC 96 06/08/2020   TRIG 81 06/08/2020   CHOLHDL 3.6 06/08/2020   Lab Results  Component Value Date   WBC 13.5 (H) 06/08/2020   HGB 12.0 06/08/2020   HCT 41.6 06/08/2020   MCV 70 (L) 06/08/2020   PLT 408 06/08/2020   Lab Results  Component Value Date   IRON 31 06/08/2020   TIBC 409 06/08/2020   FERRITIN 61 06/08/2020   Attestation Statements:   Reviewed by clinician on day of visit: allergies, medications, problem list, medical history, surgical history, family history, social history, and previous encounter notes.   06/10/2020, am acting as transcriptionist for Trude Mcburney, PA-C.  I have reviewed the above documentation for accuracy and completeness, and I agree with the above. [employer], PA-C

## 2020-10-03 ENCOUNTER — Other Ambulatory Visit: Payer: Self-pay

## 2020-10-03 ENCOUNTER — Ambulatory Visit (INDEPENDENT_AMBULATORY_CARE_PROVIDER_SITE_OTHER): Payer: No Typology Code available for payment source | Admitting: Family Medicine

## 2020-10-03 ENCOUNTER — Encounter (INDEPENDENT_AMBULATORY_CARE_PROVIDER_SITE_OTHER): Payer: Self-pay | Admitting: Family Medicine

## 2020-10-03 ENCOUNTER — Other Ambulatory Visit (HOSPITAL_COMMUNITY): Payer: Self-pay

## 2020-10-03 VITALS — BP 125/78 | HR 75 | Temp 98.3°F | Ht 62.0 in | Wt 305.0 lb

## 2020-10-03 DIAGNOSIS — D508 Other iron deficiency anemias: Secondary | ICD-10-CM | POA: Diagnosis not present

## 2020-10-03 DIAGNOSIS — K5909 Other constipation: Secondary | ICD-10-CM | POA: Diagnosis not present

## 2020-10-03 DIAGNOSIS — F3289 Other specified depressive episodes: Secondary | ICD-10-CM

## 2020-10-03 DIAGNOSIS — R7303 Prediabetes: Secondary | ICD-10-CM

## 2020-10-03 DIAGNOSIS — Z6841 Body Mass Index (BMI) 40.0 and over, adult: Secondary | ICD-10-CM

## 2020-10-03 DIAGNOSIS — Z9189 Other specified personal risk factors, not elsewhere classified: Secondary | ICD-10-CM | POA: Diagnosis not present

## 2020-10-03 DIAGNOSIS — E559 Vitamin D deficiency, unspecified: Secondary | ICD-10-CM

## 2020-10-03 DIAGNOSIS — E66813 Obesity, class 3: Secondary | ICD-10-CM

## 2020-10-03 MED ORDER — VITAMIN D (ERGOCALCIFEROL) 1.25 MG (50000 UNIT) PO CAPS
1.0000 | ORAL_CAPSULE | ORAL | 0 refills | Status: DC
  Filled 2020-10-03 – 2020-10-18 (×2): qty 4, 28d supply, fill #0

## 2020-10-03 MED ORDER — BUPROPION HCL ER (SR) 150 MG PO TB12
150.0000 mg | ORAL_TABLET | Freq: Every morning | ORAL | 0 refills | Status: DC
  Filled 2020-10-03 – 2020-10-18 (×2): qty 30, 30d supply, fill #0

## 2020-10-10 NOTE — Progress Notes (Signed)
Chief Complaint:   OBESITY Donna Bond is here to discuss her progress with her obesity treatment plan along with follow-up of her obesity related diagnoses.   Today's visit was #: 7 Starting weight: 325 lbs Starting date: 06/08/2020 Today's weight: 305 lbs Today's date: 10/03/2020 Weight change since last visit: 4 lbs Total lbs lost to date: 20 lbs Body mass index is 55.79 kg/m.  Total weight loss percentage to date: -6.15%  Interim History:  Donna Bond started walking for 45 minutes per day 5 days per week.  She says the meal plan is going well.  She lost her fridge recently and struggled with keeping food fresh.  Current Meal Plan: the Category 3 Plan for 80% of the time.  Current Exercise Plan: Walking for 45 minutes 5 times per week.  Assessment/Plan:   Medications Discontinued During This Encounter  Medication Reason   Vitamin D, Ergocalciferol, (DRISDOL) 1.25 MG (50000 UNIT) CAPS capsule Reorder   buPROPion (WELLBUTRIN SR) 150 MG 12 hr tablet Reorder   Meds ordered this encounter  Medications   Vitamin D, Ergocalciferol, (DRISDOL) 1.25 MG (50000 UNIT) CAPS capsule    Sig: Take 1 capsule (50,000 Units total) by mouth every 7 (seven) days.    Dispense:  4 capsule    Refill:  0    Needs ov for RF   buPROPion (WELLBUTRIN SR) 150 MG 12 hr tablet    Sig: Take 1 tablet (150 mg total) by mouth in the morning.    Dispense:  30 tablet    Refill:  0    30 d supply; ov for rf    1. Vitamin D deficiency Not at goal. Current vitamin D is 15.6, tested on 06/08/2020. Optimal goal > 50 ng/dL.   Plan: Continue to take prescription Vitamin D @50 ,000 IU every week as prescribed.  Follow-up for routine testing of Vitamin D, at least 2-3 times per year to avoid over-replacement.  Will check vitamin D at next office visit.  - Refill Vitamin D, Ergocalciferol, (DRISDOL) 1.25 MG (50000 UNIT) CAPS capsule; Take 1 capsule (50,000 Units total) by mouth every 7 (seven) days.  Dispense: 4 capsule;  Refill: 0  2. Other constipation This problem is well controlled with MiraLAX.  She is taking an iron supplement most days; 2 times per day.  Plan:  Continue MiraLAX as needed.  Donna Bond was informed that a decrease in bowel movement frequency is normal while losing weight, but stools should not be hard or painful.  Counseling: Getting to Good Bowel Health: Your goal is to have one soft bowel movement each day. Drink at least 8 glasses of water each day. Eat plenty of fiber (goal is over 30 grams each day). It is best to get most of your fiber from dietary sources which includes leafy green vegetables, fresh fruit, and whole grains. You may need to add fiber with the help of OTC fiber supplements. These include Metamucil, Citrucel, and Benefiber. If you are still having trouble, try adding an osmotic laxative such as Miralax. If all of these changes do not work, .   3. Other iron deficiency anemia Donna Bond is taking ferrous sulfate 325 mg twice daily.  Plan:  Check labs at next office visit.    Nutrition: Iron-rich foods include dark leafy greens, red and white meats, eggs, seafood, and beans.  Certain foods and drinks prevent your body from absorbing iron properly. Avoid eating these foods in the same meal as iron-rich foods or  with iron supplements. These foods include: coffee, black tea, and red wine; milk, dairy products, and foods that are high in calcium; beans and soybeans; whole grains. Constipation can be a side effect of iron supplementation. Increased water and fiber intake are helpful. Water goal: > 2 liters/day. Fiber goal: > 25 grams/day.  4. Prediabetes Not at goal. Goal is HgbA1c < 5.7.  Medication: None.    Plan:  Check A1c in the near future.  She will continue to focus on protein-rich, low simple carbohydrate foods. We reviewed the importance of hydration, regular exercise for stress reduction, and restorative sleep.   Lab Results  Component Value Date   HGBA1C  6.0 (H) 06/08/2020   Lab Results  Component Value Date   INSULIN 18.2 06/08/2020   5. Other depression, with emotional eating At goal. Medication: Wellbutrin 150 mg daily.  Started on Wellbutrin for cravings at last office visit.  Not sure how well it is working yet.  Tolerating well, without side effects.  Sleeping well.  Plan:  Will refill Wellbutrin today, as per below.  Behavior modification techniques were discussed today to help deal with emotional/non-hunger eating behaviors.  - Refill buPROPion (WELLBUTRIN SR) 150 MG 12 hr tablet; Take 1 tablet (150 mg total) by mouth in the morning.  Dispense: 30 tablet; Refill: 0  6. At risk for diabetes mellitus - Donna Bond was given diabetes prevention education and counseling today of more than 10 minutes.  - Counseled patient on pathophysiology of disease and meaning/ implication of lab results.  - Reviewed how certain foods can either stimulate or inhibit insulin release, and subsequently affect hunger pathways  - Importance of following a healthy meal plan with limiting amounts of simple carbohydrates discussed with patient - Effects of regular aerobic exercise on blood sugar regulation reviewed and encouraged an eventual goal of 30 min 5d/week or more as a minimum.  - Briefly discussed treatment options, which always include dietary and lifestyle modification as first line.   - Handouts provided at patient's desire and/or told to go online to the American Diabetes Association website for further information.   7. Current BMI 55  Course: Donna Bond is currently in the action stage of change. As such, her goal is to continue with weight loss efforts.   Nutrition goals: She has agreed to the Category 3 Plan.   Exercise goals:  As is.  Behavioral modification strategies: meal planning and cooking strategies and planning for success.  Donna Bond has agreed to follow-up with our clinic in 2-3 weeks.  Will obtain A1c, CBC, iron levels, and vitamin D. She was  informed of the importance of frequent follow-up visits to maximize her success with intensive lifestyle modifications for her multiple health conditions.   Objective:   Blood pressure 125/78, pulse 75, temperature 98.3 F (36.8 C), height 5\' 2"  (1.575 m), weight (!) 305 lb (138.3 kg), SpO2 99 %. Body mass index is 55.79 kg/m.  General: Cooperative, alert, well developed, in no acute distress. HEENT: Conjunctivae and lids unremarkable. Cardiovascular: Regular rhythm.  Lungs: Normal work of breathing. Neurologic: No focal deficits.   Lab Results  Component Value Date   CREATININE 0.76 06/08/2020   BUN 10 06/08/2020   NA 138 06/08/2020   K 4.3 06/08/2020   CL 102 06/08/2020   CO2 19 (L) 06/08/2020   Lab Results  Component Value Date   ALT 13 06/08/2020   AST 15 06/08/2020   ALKPHOS 121 06/08/2020   BILITOT 0.4 06/08/2020  Lab Results  Component Value Date   HGBA1C 6.0 (H) 06/08/2020   HGBA1C 6.1 (H) 03/15/2020   HGBA1C 5.8 06/10/2017   Lab Results  Component Value Date   INSULIN 18.2 06/08/2020   Lab Results  Component Value Date   TSH 1.300 06/08/2020   Lab Results  Component Value Date   CHOL 155 06/08/2020   HDL 43 06/08/2020   LDLCALC 96 06/08/2020   TRIG 81 06/08/2020   CHOLHDL 3.6 06/08/2020   Lab Results  Component Value Date   WBC 13.5 (H) 06/08/2020   HGB 12.0 06/08/2020   HCT 41.6 06/08/2020   MCV 70 (L) 06/08/2020   PLT 408 06/08/2020   Lab Results  Component Value Date   IRON 31 06/08/2020   TIBC 409 06/08/2020   FERRITIN 61 06/08/2020   Attestation Statements:   Reviewed by clinician on day of visit: allergies, medications, problem list, medical history, surgical history, family history, social history, and previous encounter notes.  I, Insurance claims handler, CMA, am acting as Energy manager for [employer], DO.  I have reviewed the above documentation for accuracy and completeness, and I agree with the above. [name],  D.O.  The 21st Century Cures Act was signed into law in 2016 which includes the topic of electronic health records.  This provides immediate access to information in MyChart.  This includes consultation notes, operative notes, office notes, lab results and pathology reports.  If you have any questions about what you read please let us know at your next visit so we can discuss your concerns and take corrective action if need be.  We are right here with you.

## 2020-10-11 ENCOUNTER — Other Ambulatory Visit (HOSPITAL_COMMUNITY): Payer: Self-pay

## 2020-10-17 ENCOUNTER — Emergency Department (HOSPITAL_COMMUNITY): Payer: No Typology Code available for payment source

## 2020-10-17 ENCOUNTER — Encounter (HOSPITAL_COMMUNITY): Payer: Self-pay | Admitting: Emergency Medicine

## 2020-10-17 ENCOUNTER — Other Ambulatory Visit: Payer: Self-pay

## 2020-10-17 ENCOUNTER — Encounter (INDEPENDENT_AMBULATORY_CARE_PROVIDER_SITE_OTHER): Payer: Self-pay | Admitting: Physician Assistant

## 2020-10-17 ENCOUNTER — Ambulatory Visit (INDEPENDENT_AMBULATORY_CARE_PROVIDER_SITE_OTHER): Payer: No Typology Code available for payment source | Admitting: Physician Assistant

## 2020-10-17 ENCOUNTER — Emergency Department (HOSPITAL_COMMUNITY)
Admission: EM | Admit: 2020-10-17 | Discharge: 2020-10-17 | Disposition: A | Discharge status: Home / Self Care | Payer: No Typology Code available for payment source | Attending: Emergency Medicine | Admitting: Emergency Medicine

## 2020-10-17 VITALS — BP 136/85 | HR 80 | Temp 98.1°F | Ht 62.0 in | Wt 303.0 lb

## 2020-10-17 DIAGNOSIS — Z6841 Body Mass Index (BMI) 40.0 and over, adult: Secondary | ICD-10-CM | POA: Diagnosis not present

## 2020-10-17 DIAGNOSIS — M545 Low back pain, unspecified: Secondary | ICD-10-CM | POA: Insufficient documentation

## 2020-10-17 DIAGNOSIS — Y92410 Unspecified street and highway as the place of occurrence of the external cause: Secondary | ICD-10-CM | POA: Diagnosis not present

## 2020-10-17 DIAGNOSIS — E559 Vitamin D deficiency, unspecified: Secondary | ICD-10-CM | POA: Diagnosis not present

## 2020-10-17 DIAGNOSIS — M542 Cervicalgia: Secondary | ICD-10-CM | POA: Insufficient documentation

## 2020-10-17 DIAGNOSIS — Z9189 Other specified personal risk factors, not elsewhere classified: Secondary | ICD-10-CM

## 2020-10-17 DIAGNOSIS — F3289 Other specified depressive episodes: Secondary | ICD-10-CM

## 2020-10-17 LAB — POC URINE PREG, ED: Preg Test, Ur: NEGATIVE

## 2020-10-17 MED ORDER — CYCLOBENZAPRINE HCL 10 MG PO TABS
10.0000 mg | ORAL_TABLET | Freq: Every day | ORAL | 0 refills | Status: DC
  Filled 2020-10-17: qty 10, 10d supply, fill #0

## 2020-10-17 MED ORDER — LIDOCAINE 5 % EX PTCH
1.0000 | MEDICATED_PATCH | Freq: Once | CUTANEOUS | Status: DC
  Administered 2020-10-17: 1 via TRANSDERMAL
  Filled 2020-10-17: qty 1

## 2020-10-17 MED ORDER — IBUPROFEN 400 MG PO TABS
600.0000 mg | ORAL_TABLET | Freq: Once | ORAL | Status: AC
  Administered 2020-10-17: 600 mg via ORAL
  Filled 2020-10-17: qty 1
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
## 2020-10-17 NOTE — ED Notes (Signed)
Discharge instructions discussed with pt. Pt verbalized understanding. Pt stable and ambulatory. No signature pad available. 
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
## 2020-10-18 ENCOUNTER — Other Ambulatory Visit (HOSPITAL_COMMUNITY): Payer: Self-pay

## 2020-10-19 ENCOUNTER — Other Ambulatory Visit (HOSPITAL_COMMUNITY): Payer: Self-pay

## 2020-10-19 MED ORDER — BUPROPION HCL ER (SR) 150 MG PO TB12
150.0000 mg | ORAL_TABLET | Freq: Every morning | ORAL | 0 refills | Status: DC
  Filled 2020-10-19: qty 30, 30d supply, fill #0

## 2020-10-19 MED ORDER — VITAMIN D (ERGOCALCIFEROL) 1.25 MG (50000 UNIT) PO CAPS
50000.0000 [IU] | ORAL_CAPSULE | ORAL | 0 refills | Status: DC
  Filled 2020-10-19 – 2020-11-06 (×2): qty 4, 28d supply, fill #0

## 2020-10-19 NOTE — Progress Notes (Signed)
Chief Complaint:   OBESITY Donna Bond is here to discuss her progress with her obesity treatment plan along with follow-up of her obesity related diagnoses. Donna Bond is on the Category 3 Plan and states she is following her eating plan approximately 80% of the time. Donna Bond states she is walking for 45 minutes 2-4 times per week.  Today's visit was #: 8 Starting weight: 325 lbs Starting date: 06/08/2020 Today's weight: 303 lbs Today's date: 10/17/2020 Total lbs lost to date: 22 Total lbs lost since last in-office visit: 2  Interim History: Donna Bond did well with weight loss. She is walking more often. Her food recall appears to be accurate and close to the meal plan. She is in town the next 2 weeks.  Subjective:   1. Vitamin D deficiency Donna Bond is on Vit D, and she denies nausea, vomiting, or muscle weakness.  2. Other depression, with emotional eating Donna Bond is on bupropion, and she is tolerating it well. She notes her cravings are decreased.  3. At risk for osteoporosis Donna Bond is at higher risk of osteopenia and osteoporosis due to Vitamin D deficiency.   Assessment/Plan:   1. Vitamin D deficiency Low Vitamin D level contributes to fatigue and are associated with obesity, breast, and colon cancer. We will refill prescription Vitamin D 50,000 IU every week #4 for 1 month. [name] will follow-up for routine testing of Vitamin D, at least 2-3 times per year to avoid over-replacement.  2. Other depression, with emotional eating Behavior modification techniques were discussed today to help Donna Bond deal with her emotional/non-hunger eating behaviors. We will refill bupropion 150 mg q AM #30 for 1 month. Orders and follow up as documented in patient record.   3. At risk for osteoporosis Donna Bond was given approximately 15 minutes of osteoporosis prevention counseling today. Donna Bond is at risk for osteopenia and osteoporosis due to her Vitamin D deficiency. She was encouraged to take her Vitamin D and follow her higher calcium  diet and increase strengthening exercise to help strengthen her bones and decrease her risk of osteopenia and osteoporosis.  Repetitive spaced learning was employed today to elicit superior memory formation and behavioral change.  4. Body mass index (BMI) 60.0-69.9, adult (HCC) Donna Bond is currently in the action stage of change. As such, her goal is to continue with weight loss efforts. She has agreed to the Category 3 Plan.   Exercise goals: As is.  Behavioral modification strategies: meal planning and cooking strategies.  Donna Bond has agreed to follow-up with our clinic in 2 weeks. She was informed of the importance of frequent follow-up visits to maximize her success with intensive lifestyle modifications for her multiple health conditions.   Objective:   Blood pressure 136/85, pulse 80, temperature 98.1 F (36.7 C), height 5\' 2"  (1.575 m), weight (!) 303 lb (137.4 kg), SpO2 99 %. Body mass index is 55.42 kg/m.  General: Cooperative, alert, well developed, in no acute distress. HEENT: Conjunctivae and lids unremarkable. Cardiovascular: Regular rhythm.  Lungs: Normal work of breathing. Neurologic: No focal deficits.   Lab Results  Component Value Date   CREATININE 0.76 06/08/2020   BUN 10 06/08/2020   NA 138 06/08/2020   K 4.3 06/08/2020   CL 102 06/08/2020   CO2 19 (L) 06/08/2020   Lab Results  Component Value Date   ALT 13 06/08/2020   AST 15 06/08/2020   ALKPHOS 121 06/08/2020   BILITOT 0.4 06/08/2020   Lab Results  Component Value Date   HGBA1C  6.0 (H) 06/08/2020   HGBA1C 6.1 (H) 03/15/2020   HGBA1C 5.8 06/10/2017   Lab Results  Component Value Date   INSULIN 18.2 06/08/2020   Lab Results  Component Value Date   TSH 1.300 06/08/2020   Lab Results  Component Value Date   CHOL 155 06/08/2020   HDL 43 06/08/2020   LDLCALC 96 06/08/2020   TRIG 81 06/08/2020   CHOLHDL 3.6 06/08/2020   Lab Results  Component Value Date   VD25OH 15.6 (L) 06/08/2020   VD25OH  16.6 (L) 03/15/2020   Lab Results  Component Value Date   WBC 13.5 (H) 06/08/2020   HGB 12.0 06/08/2020   HCT 41.6 06/08/2020   MCV 70 (L) 06/08/2020   PLT 408 06/08/2020   Lab Results  Component Value Date   IRON 31 06/08/2020   TIBC 409 06/08/2020   FERRITIN 61 06/08/2020   Attestation Statements:   Reviewed by clinician on day of visit: allergies, medications, problem list, medical history, surgical history, family history, social history, and previous encounter notes.   Trude Mcburney, am acting as transcriptionist for [employer], PA-C.  I have reviewed the above documentation for accuracy and completeness, and I agree with the above. Alois Cliche, PA-C

## 2020-10-31 ENCOUNTER — Encounter: Payer: Self-pay | Admitting: Internal Medicine

## 2020-10-31 ENCOUNTER — Other Ambulatory Visit: Payer: Self-pay

## 2020-10-31 ENCOUNTER — Ambulatory Visit (INDEPENDENT_AMBULATORY_CARE_PROVIDER_SITE_OTHER): Payer: No Typology Code available for payment source | Admitting: Internal Medicine

## 2020-10-31 VITALS — BP 130/74 | HR 84 | Temp 98.5°F | Resp 18 | Ht 62.0 in | Wt 303.6 lb

## 2020-10-31 DIAGNOSIS — D72829 Elevated white blood cell count, unspecified: Secondary | ICD-10-CM

## 2020-10-31 DIAGNOSIS — Z124 Encounter for screening for malignant neoplasm of cervix: Secondary | ICD-10-CM | POA: Diagnosis not present

## 2020-10-31 DIAGNOSIS — F3289 Other specified depressive episodes: Secondary | ICD-10-CM

## 2020-10-31 DIAGNOSIS — Z6841 Body Mass Index (BMI) 40.0 and over, adult: Secondary | ICD-10-CM

## 2020-10-31 DIAGNOSIS — E559 Vitamin D deficiency, unspecified: Secondary | ICD-10-CM

## 2020-10-31 DIAGNOSIS — R7303 Prediabetes: Secondary | ICD-10-CM | POA: Diagnosis not present

## 2020-10-31 DIAGNOSIS — M545 Low back pain, unspecified: Secondary | ICD-10-CM

## 2020-10-31 DIAGNOSIS — E611 Iron deficiency: Secondary | ICD-10-CM

## 2020-10-31 DIAGNOSIS — G8929 Other chronic pain: Secondary | ICD-10-CM

## 2020-10-31 LAB — CBC WITH DIFFERENTIAL/PLATELET
Basophils Absolute: 0.1 10*3/uL (ref 0.0–0.1)
Basophils Relative: 0.5 % (ref 0.0–3.0)
Eosinophils Absolute: 0.1 10*3/uL (ref 0.0–0.7)
Eosinophils Relative: 1.2 % (ref 0.0–5.0)
HCT: 36.8 % (ref 36.0–46.0)
Hemoglobin: 11.6 g/dL — ABNORMAL LOW (ref 12.0–15.0)
Lymphocytes Relative: 23.3 % (ref 12.0–46.0)
Lymphs Abs: 2.6 10*3/uL (ref 0.7–4.0)
MCHC: 31.4 g/dL (ref 30.0–36.0)
MCV: 64.4 fl — ABNORMAL LOW (ref 78.0–100.0)
Monocytes Absolute: 0.7 10*3/uL (ref 0.1–1.0)
Monocytes Relative: 5.9 % (ref 3.0–12.0)
Neutro Abs: 7.9 10*3/uL — ABNORMAL HIGH (ref 1.4–7.7)
Neutrophils Relative %: 69.1 % (ref 43.0–77.0)
Platelets: 360 10*3/uL (ref 150.0–400.0)
RBC: 5.69 Mil/uL — ABNORMAL HIGH (ref 3.87–5.11)
RDW: 18.1 % — ABNORMAL HIGH (ref 11.5–15.5)
WBC: 11.4 10*3/uL — ABNORMAL HIGH (ref 4.0–10.5)

## 2020-10-31 LAB — VITAMIN D 25 HYDROXY (VIT D DEFICIENCY, FRACTURES): VITD: 34.9 ng/mL (ref 30.00–100.00)

## 2020-10-31 LAB — HEMOGLOBIN A1C: Hgb A1c MFr Bld: 6.1 % (ref 4.6–6.5)

## 2020-10-31 NOTE — Patient Instructions (Signed)
We will get you in with an ob/gyn doctor to do the pap smear and switch the IUD when needed.   We will check the labs today.
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
## 2020-11-02 ENCOUNTER — Ambulatory Visit (INDEPENDENT_AMBULATORY_CARE_PROVIDER_SITE_OTHER): Payer: No Typology Code available for payment source | Admitting: Family Medicine

## 2020-11-03 NOTE — Assessment & Plan Note (Signed)
Seeing healthy weight and wellness clinic and is using high dose vitamin D and wellbutrin to lose weight. Encouraged that journey.

## 2020-11-03 NOTE — Assessment & Plan Note (Signed)
States taking wellbutrin for weight management not depression.

## 2020-11-03 NOTE — Assessment & Plan Note (Signed)
Checking HgA1c. 

## 2020-11-03 NOTE — Assessment & Plan Note (Signed)
Rechecking vitamin D levels as she has been on the high dose replacement for several months now.

## 2020-11-03 NOTE — Assessment & Plan Note (Signed)
Checking CBC w differential.

## 2020-11-03 NOTE — Assessment & Plan Note (Signed)
Checking CBC with diff today. She is taking iron daily otc.

## 2020-11-03 NOTE — Assessment & Plan Note (Signed)
She is using flexeril prn and mostly otc meds for the pain but does not like taking medications. She is overall improving some. No imaging indicated today but in 1-2 weeks if she is still having problems she will let us know and we can do PT for her.

## 2020-11-06 ENCOUNTER — Other Ambulatory Visit (HOSPITAL_COMMUNITY): Payer: Self-pay

## 2020-11-06 ENCOUNTER — Encounter: Payer: Self-pay | Admitting: Internal Medicine

## 2020-11-06 MED ORDER — CYCLOBENZAPRINE HCL 5 MG PO TABS
5.0000 mg | ORAL_TABLET | Freq: Three times a day (TID) | ORAL | 1 refills | Status: DC | PRN
  Filled 2020-11-06: qty 30, 10d supply, fill #0
  Filled 2021-02-15: qty 30, 10d supply, fill #1

## 2020-11-07 ENCOUNTER — Ambulatory Visit: Payer: No Typology Code available for payment source | Admitting: Internal Medicine

## 2020-11-07 ENCOUNTER — Other Ambulatory Visit (HOSPITAL_COMMUNITY): Payer: Self-pay

## 2020-11-09 ENCOUNTER — Other Ambulatory Visit (HOSPITAL_COMMUNITY): Payer: Self-pay

## 2020-11-15 ENCOUNTER — Other Ambulatory Visit: Payer: Self-pay

## 2020-11-15 ENCOUNTER — Encounter (INDEPENDENT_AMBULATORY_CARE_PROVIDER_SITE_OTHER): Payer: Self-pay | Admitting: Family Medicine

## 2020-11-15 ENCOUNTER — Ambulatory Visit (INDEPENDENT_AMBULATORY_CARE_PROVIDER_SITE_OTHER): Payer: No Typology Code available for payment source | Admitting: Family Medicine

## 2020-11-15 ENCOUNTER — Other Ambulatory Visit (HOSPITAL_COMMUNITY): Payer: Self-pay

## 2020-11-15 VITALS — BP 109/74 | HR 78 | Temp 98.0°F | Ht 62.0 in | Wt 301.0 lb

## 2020-11-15 DIAGNOSIS — F3289 Other specified depressive episodes: Secondary | ICD-10-CM | POA: Diagnosis not present

## 2020-11-15 DIAGNOSIS — Z9189 Other specified personal risk factors, not elsewhere classified: Secondary | ICD-10-CM

## 2020-11-15 DIAGNOSIS — Z6841 Body Mass Index (BMI) 40.0 and over, adult: Secondary | ICD-10-CM

## 2020-11-15 DIAGNOSIS — R7303 Prediabetes: Secondary | ICD-10-CM | POA: Diagnosis not present

## 2020-11-15 DIAGNOSIS — E559 Vitamin D deficiency, unspecified: Secondary | ICD-10-CM

## 2020-11-15 DIAGNOSIS — D508 Other iron deficiency anemias: Secondary | ICD-10-CM

## 2020-11-15 MED ORDER — FERROUS SULFATE 325 (65 FE) MG PO TABS: 325.0000 mg | ORAL_TABLET | Freq: Two times a day (BID) | ORAL | 0 refills | Status: DC

## 2020-11-15 MED ORDER — OZEMPIC (0.25 OR 0.5 MG/DOSE) 2 MG/1.5ML ~~LOC~~ SOPN
0.5000 mg | PEN_INJECTOR | SUBCUTANEOUS | 0 refills | Status: DC
  Filled 2020-11-15: qty 1.5, 28d supply, fill #0

## 2020-11-15 MED ORDER — VITAMIN D (ERGOCALCIFEROL) 1.25 MG (50000 UNIT) PO CAPS: 50000.0000 [IU] | ORAL_CAPSULE | ORAL | 0 refills | Status: DC

## 2020-11-16 ENCOUNTER — Other Ambulatory Visit (HOSPITAL_COMMUNITY): Payer: Self-pay

## 2020-11-17 ENCOUNTER — Other Ambulatory Visit (HOSPITAL_COMMUNITY): Payer: Self-pay

## 2020-11-17 MED ORDER — METRONIDAZOLE 500 MG PO TABS
500.0000 mg | ORAL_TABLET | Freq: Two times a day (BID) | ORAL | 0 refills | Status: AC
  Filled 2020-11-17: qty 14, 7d supply, fill #0

## 2020-11-20 NOTE — Progress Notes (Signed)
Chief Complaint:   OBESITY Donna Bond is here to discuss her progress with her obesity treatment plan along with follow-up of her obesity related diagnoses. Donna Bond is on the Category 3 Plan and states she is following her eating plan approximately 80% of the time. [name] states she is walking 45 minutes 1 times per week.  Today's visit was #: 9 Starting weight: 325 lbs Starting date: 06/08/2020 Today's weight: 301 lbs Today's date: 11/15/2020 Total lbs lost to date: 24 Total lbs lost since last in-office visit: 2  Interim History: [name] was seen by PCP recently and no change to treatment was done. She recently has labs at her new PCP's office. Pt would like [name] to review them with her today. She denies issues with plan but states she still eats sweets/desserts and "off-plan" foods most of the time. She denies hunger.  Subjective:   1. Pre-diabetes Discussed labs with patient today. Donna Bond's A1c is elevated despite 24 lbs weight loss versus last check 5 months ago. She has no family history of thyroid cancer or personal history of pancreatitis.   Lab Results  Component Value Date   HGBA1C 6.1 10/31/2020   Lab Results  Component Value Date   INSULIN 18.2 06/08/2020   2. Other iron deficiency anemia Discussed labs with patient today. Donna Bond is not taking ferrous sulfate BID. She denies constipation or issues with medication.  CBC Latest Ref Rng & Units 10/31/2020 06/08/2020 03/15/2020  WBC 4.0 - 10.5 K/uL 11.4(H) 13.5(H) 12.6(H)  Hemoglobin 12.0 - 15.0 g/dL 11.6(L) 12.0 11.9  Hematocrit 36.0 - 46.0 % 36.8 41.6 40.4  Platelets 150.0 - 400.0 K/uL 360.0 408 376   Lab Results  Component Value Date   IRON 31 06/08/2020   TIBC 409 06/08/2020   FERRITIN 61 06/08/2020   Lab Results  Component Value Date   VITAMINB12 713 06/08/2020   3. Other depression, with emotional eating Donna Bond reports some sleep disturbances but is not sure if it's the Wellbutrin.   4. Vitamin D deficiency Discussed labs  with patient today. Donna Bond's Vit D level was 34.9 two weeks ago. She is currently taking prescription vitamin D 50,000 IU each week faithfully. She has less fatigue.  Lab Results  Component Value Date   VD25OH 34.90 10/31/2020   VD25OH 15.6 (L) 06/08/2020   VD25OH 16.6 (L) 03/15/2020   5. At risk for side effect of medication [name] is at risk for side effects of medication due to starting a new medication.  Assessment/Plan:  No orders of the defined types were placed in this encounter.   Medications Discontinued During This Encounter  Medication Reason   ferrous sulfate 325 (65 FE) MG tablet Reorder   Vitamin D, Ergocalciferol, (DRISDOL) 1.25 MG (50000 UNIT) CAPS capsule Reorder     Meds ordered this encounter  Medications   Semaglutide,0.25 or 0.5MG /DOS, (OZEMPIC, 0.25 OR 0.5 MG/DOSE,) 2 MG/1.5ML SOPN    Sig: Inject 0.5 mg into the skin once a week.    Dispense:  1.5 mL    Refill:  0   Vitamin D, Ergocalciferol, (DRISDOL) 1.25 MG (50000 UNIT) CAPS capsule    Sig: Take 1 capsule (50,000 Units total) by mouth 2 (two) times a week.    Dispense:  8 capsule    Refill:  0    Needs ov for RF   ferrous sulfate 325 (65 FE) MG tablet    Sig: Take 1 tablet (325 mg total) by mouth 2 (two) times daily with  a meal.    Dispense:  60 tablet    Refill:  0     1. Pre-diabetes Donna Bond will start Ozempic 0.25 mg weekly. Risk and benefits discussed with pt and all questions were answered. She will continue to work on weight loss, exercise, and decreasing simple carbohydrates to help decrease the risk of diabetes.   Start- Semaglutide,0.25 or 0.5MG /DOS, (OZEMPIC, 0.25 OR 0.5 MG/DOSE,) 2 MG/1.5ML SOPN; Inject 0.5 mg into the skin once a week.  Dispense: 1.5 mL; Refill: 0  2. Other iron deficiency anemia Still too low. Pt need to take ferrous sulfate twice a day! Constipation prevention discussed with pt.  Refill- ferrous sulfate 325 (65 FE) MG tablet; Take 1 tablet (325 mg total) by mouth 2 (two)  times daily with a meal.  Dispense: 60 tablet; Refill: 0  3. Other depression, with emotional eating Donna Bond will continue Wellbutrin but take it in the mornings. She declines a refill today. Behavior modification techniques were discussed today to help Donna Bond deal with her emotional/non-hunger eating behaviors.  Orders and follow up as documented in patient record.   4. Vitamin D deficiency Low Vitamin D level contributes to fatigue and are associated with obesity, breast, and colon cancer. She agrees to increase prescription Vitamin D 50,000 IU to twice a week and will follow-up for routine testing of Vitamin D, at least 2-3 times per year to avoid over-replacement.  Refill and Increase- Vitamin D, Ergocalciferol, (DRISDOL) 1.25 MG (50000 UNIT) CAPS capsule; Take 1 capsule (50,000 Units total) by mouth 2 (two) times a week.  Dispense: 8 capsule; Refill: 0  5. At risk for side effect of medication Donna Bond was given approximately 9 minutes of drug side effect counseling today.  We discussed side effect possibility and risk versus benefits. Donna Bond agreed to the medication and will contact this office if these side effects are intolerable.  Repetitive spaced learning was employed today to elicit superior memory formation and behavioral change.   6. Obesity with current BMI of 55.1  Donna Bond is currently in the action stage of change. As such, her goal is to continue with weight loss efforts. She has agreed to the Category 3 Plan.   Exercise goals:  Increase as tolerated to a couple days a week.  Behavioral modification strategies: decreasing simple carbohydrates, better snacking choices, and avoiding temptations.  Donna Bond has agreed to follow-up with our clinic in 2 weeks. She was informed of the importance of frequent follow-up visits to maximize her success with intensive lifestyle modifications for her multiple health conditions.   Objective:   Blood pressure 109/74, pulse 78, temperature 98 F (36.7 C),  height 5\' 2"  (1.575 m), weight (!) 301 lb (136.5 kg), SpO2 98 %. Body mass index is 55.05 kg/m.  General: Cooperative, alert, well developed, in no acute distress. HEENT: Conjunctivae and lids unremarkable. Cardiovascular: Regular rhythm.  Lungs: Normal work of breathing. Neurologic: No focal deficits.   Lab Results  Component Value Date   CREATININE 0.76 06/08/2020   BUN 10 06/08/2020   NA 138 06/08/2020   K 4.3 06/08/2020   CL 102 06/08/2020   CO2 19 (L) 06/08/2020   Lab Results  Component Value Date   ALT 13 06/08/2020   AST 15 06/08/2020   ALKPHOS 121 06/08/2020   BILITOT 0.4 06/08/2020   Lab Results  Component Value Date   HGBA1C 6.1 10/31/2020   HGBA1C 6.0 (H) 06/08/2020   HGBA1C 6.1 (H) 03/15/2020   HGBA1C 5.8 06/10/2017  Lab Results  Component Value Date   INSULIN 18.2 06/08/2020   Lab Results  Component Value Date   TSH 1.300 06/08/2020   Lab Results  Component Value Date   CHOL 155 06/08/2020   HDL 43 06/08/2020   LDLCALC 96 06/08/2020   TRIG 81 06/08/2020   CHOLHDL 3.6 06/08/2020   Lab Results  Component Value Date   VD25OH 34.90 10/31/2020   VD25OH 15.6 (L) 06/08/2020   VD25OH 16.6 (L) 03/15/2020   Lab Results  Component Value Date   WBC 11.4 (H) 10/31/2020   HGB 11.6 (L) 10/31/2020   HCT 36.8 10/31/2020   MCV 64.4 Repeated and verified X2. (L) 10/31/2020   PLT 360.0 10/31/2020   Lab Results  Component Value Date   IRON 31 06/08/2020   TIBC 409 06/08/2020   FERRITIN 61 06/08/2020    Attestation Statements:   Reviewed by clinician on day of visit: allergies, medications, problem list, medical history, surgical history, family history, social history, and previous encounter notes.  Edmund Hilda, CMA, am acting as transcriptionist for [employer], DO.  I have reviewed the above documentation for accuracy and completeness, and I agree with the above. [name], D.O.  The 21st Century Cures Act was signed into  law in 2016 which includes the topic of electronic health records.  This provides immediate access to information in MyChart.  This includes consultation notes, operative notes, office notes, lab results and pathology reports.  If you have any questions about what you read please let us know at your next visit so we can discuss your concerns and take corrective action if need be.  We are right here with you.

## 2020-11-29 ENCOUNTER — Ambulatory Visit (INDEPENDENT_AMBULATORY_CARE_PROVIDER_SITE_OTHER): Payer: No Typology Code available for payment source | Admitting: Family Medicine

## 2020-11-29 ENCOUNTER — Encounter (INDEPENDENT_AMBULATORY_CARE_PROVIDER_SITE_OTHER): Payer: Self-pay | Admitting: Family Medicine

## 2020-11-29 ENCOUNTER — Other Ambulatory Visit: Payer: Self-pay

## 2020-11-29 ENCOUNTER — Other Ambulatory Visit (HOSPITAL_COMMUNITY): Payer: Self-pay

## 2020-11-29 VITALS — BP 113/78 | HR 82 | Temp 98.2°F | Ht 62.0 in | Wt 299.0 lb

## 2020-11-29 DIAGNOSIS — K5903 Drug induced constipation: Secondary | ICD-10-CM

## 2020-11-29 DIAGNOSIS — D508 Other iron deficiency anemias: Secondary | ICD-10-CM

## 2020-11-29 DIAGNOSIS — R7303 Prediabetes: Secondary | ICD-10-CM

## 2020-11-29 DIAGNOSIS — E559 Vitamin D deficiency, unspecified: Secondary | ICD-10-CM

## 2020-11-29 DIAGNOSIS — Z9189 Other specified personal risk factors, not elsewhere classified: Secondary | ICD-10-CM | POA: Diagnosis not present

## 2020-11-29 DIAGNOSIS — Z6841 Body Mass Index (BMI) 40.0 and over, adult: Secondary | ICD-10-CM

## 2020-11-29 MED ORDER — POLYETHYLENE GLYCOL 3350 17 G PO PACK
17.0000 g | PACK | Freq: Two times a day (BID) | ORAL | 0 refills | Status: DC
Start: 2020-11-29 — End: 2023-03-17

## 2020-11-29 MED ORDER — VITAMIN D (ERGOCALCIFEROL) 1.25 MG (50000 UNIT) PO CAPS
50000.0000 [IU] | ORAL_CAPSULE | ORAL | 0 refills | Status: DC
  Filled 2020-11-29: qty 8, 28d supply, fill #0

## 2020-11-29 MED ORDER — FERROUS SULFATE 325 (65 FE) MG PO TABS
325.0000 mg | ORAL_TABLET | Freq: Two times a day (BID) | ORAL | 0 refills | Status: DC
  Filled 2020-11-29: qty 60, 30d supply, fill #0

## 2020-11-29 MED ORDER — OZEMPIC (0.25 OR 0.5 MG/DOSE) 2 MG/1.5ML ~~LOC~~ SOPN
0.5000 mg | PEN_INJECTOR | SUBCUTANEOUS | 0 refills | Status: DC
Start: 2020-11-29 — End: 2020-12-20
  Filled 2020-11-29: qty 1.5, 28d supply, fill #0

## 2020-12-04 NOTE — Progress Notes (Signed)
Chief Complaint:   OBESITY Donna Bond is here to discuss her progress with her obesity treatment plan along with follow-up of her obesity related diagnoses. Donna Bond is on the Category 3 Plan and states she is following her eating plan approximately 90% of the time. Donna Bond states she is walking for 45 minutes 2 times per week.  Today's visit was #: 10 Starting weight: 325 lbs Starting date: 06/08/2020 Today's weight: 299 lbs Today's date: 11/29/2020 Total lbs lost to date: 26 Total lbs lost since last in-office visit: 2  Interim History: Donna Bond skips her bread most meals, and she doesn't measure her protein. She eats chicken wings as dinner time protein, but she is unsure of the oz of chicken.  Subjective:   1. Pre-diabetes Donna Bond forget to start at 0.25 mg. She started 0.5 mg and she felt nauseated and very tired for 2-3 days after her injection, but it has now resolved. She notes increased constipation.  2. Vitamin D deficiency Donna Bond is currently taking prescription vitamin D 50,000 IU each week. She denies nausea, vomiting or muscle weakness.  3. Other iron deficiency anemia Donna Bond taking once daily most days.  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 She is not eating her high fiber breads.   5. At risk for malnutrition Donna Bond is at increased risk for malnutrition.  Assessment/Plan:   1. Pre-diabetes Donna Bond will continue to work on weight loss, exercise, and decreasing simple carbohydrates to help decrease the risk of diabetes. We will refill Ozempic at 0.25 mg q weekly for 1 month, she is to only take 0.25 mg until her next office visit.  - Semaglutide,0.25 or 0.5MG /DOS, (OZEMPIC, 0.25 OR 0.5 MG/DOSE,) 2 MG/1.5ML SOPN; Inject 0.5 mg into the skin once a week.  Dispense: 1.5 mL; Refill: 0  2. Vitamin D deficiency Low Vitamin D level contributes to fatigue and are associated with obesity, breast, and colon cancer. We will  refill prescription Vitamin D for 1 month. Donna Bond will follow-up for routine testing of Vitamin D, at least 2-3 times per year to avoid over-replacement.  - Vitamin D, Ergocalciferol, (DRISDOL) 1.25 MG (50000 UNIT) CAPS capsule; Take 1 capsule (50,000 Units total) by mouth 2 (two) times a week.  Dispense: 8 capsule; Refill: 0  3. Other iron deficiency anemia Donna Bond will continue taking ferrous sulfate 325 mg, and we will refill for 1 month. Education was given today. Orders and follow up as documented in patient record.  Counseling Iron is essential for our bodies to make red blood cells.  Reasons that someone may be deficient include: an iron-deficient diet (more likely in those following vegan or vegetarian diets), women with heavy menses, patients with GI disorders or poor absorption, patients that have had bariatric surgery, frequent blood donors, patients with cancer, and patients with heart disease.   Iron-rich foods include dark leafy greens, red and white meats, eggs, seafood, and beans.   Certain foods and drinks prevent your body from absorbing iron properly. Avoid eating these foods in the same meal as iron-rich foods or with iron supplements. These foods include: coffee, black tea, and red wine; milk, dairy products, and foods that are high in calcium; beans and soybeans; whole grains.  Constipation can be a side effect of iron supplementation. Increased water and fiber intake are helpful. Water goal: > 2 liters/day. Fiber goal: > 25 grams/day.  - ferrous sulfate 325 (65 FE) MG tablet; Take  1 tablet (325 mg total) by mouth 2 (two) times daily with a meal.  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 She was informed that a decrease in bowel movement frequency is normal while losing weight, but stools should not be hard or painful.  - Patient advised to begin MiraLAX once daily or every other day titrated for  one softer, normal caliber bowel movement each day.    - Discussed addition of laxatives such as Peri-colace and Colace as needed for refractory constipation.   - Adequate daily water intake encouraged along with activity/ movement  - Bowel habits and good bowel hygiene discussed with patient  - F/up with your primary care provider if W or NI to rule out causes for constipation besides nutrition  Additional Counseling-  Good Bowel Health: Your goal is to have one soft bowel movement each day. Drink at least half of your weight in ounces of water per day unless otherwise noted by one of your doctors that you must restrict water intake.  Eat plenty of fiber (goal is over 25 grams each day).  It is best to get most of your fiber from dietary sources which includes leafy green vegetables, fresh fruit, and whole grains.  You may need to add fiber with the help of OTC fiber supplements.  These include the likes of Metamucil, Citrucel, Psyllium husks and Flaxseed.  If you are still having trouble, try adding Fleet's enemas or Magnesium Citrate. If all of these changes do not work, contact your PCP.  - polyethylene glycol (MIRALAX / GLYCOLAX) 17 g packet; Mix and take 17 g by mouth 2 (two) times daily. Until stooling regularly, then use as needed  Dispense: 60 packet; Refill: 0  5. At risk for malnutrition Donna Bond was given extensive malnutrition prevention education and counseling today of more than 23 minutes.  Counseled her that malnutrition refers to inappropriate nutrients or not the right balance of nutrients for optimal health.  Discussed with Donna Bond that it is absolutely possible to be malnourished but yet obese.  Risk factors, including but not limited to, inappropriate dietary choices, difficulty with obtaining food due to physical or financial limitations, and various physical and mental health conditions were reviewed with Donna Bond.    6. Obesity with current BMI 54.8 [name] is currently in the  action stage of change. As such, her goal is to continue with weight loss efforts. She has agreed to the Category 3 Plan.   Donna Bond is to focus on dinner protein and weighing and measuring her dinner time meals. She is to increase fiber and continue 100+ oz of water daily.  Exercise goals: As is.  Behavioral modification strategies: increasing lean protein intake, decreasing simple carbohydrates, increasing high fiber foods, and no skipping meals.  Donna Bond has agreed to follow-up with our clinic in 3 weeks. She was informed of the importance of frequent follow-up visits to maximize her success with intensive lifestyle modifications for her multiple health conditions.   Objective:   Blood pressure 113/78, pulse 82, temperature 98.2 F (36.8 C), height 5\' 2"  (1.575 m), weight 299 lb (135.6 kg), SpO2 98 %. Body mass index is 54.69 kg/m.  General: Cooperative, alert, well developed, in no acute distress. HEENT: Conjunctivae and lids unremarkable. Cardiovascular: Regular rhythm.  Lungs: Normal work of breathing. Neurologic: No focal deficits.   Lab Results  Component Value Date   CREATININE 0.76 06/08/2020  BUN 10 06/08/2020   NA 138 06/08/2020   K 4.3 06/08/2020   CL 102 06/08/2020   CO2 19 (L) 06/08/2020   Lab Results  Component Value Date   ALT 13 06/08/2020   AST 15 06/08/2020   ALKPHOS 121 06/08/2020   BILITOT 0.4 06/08/2020   Lab Results  Component Value Date   HGBA1C 6.1 10/31/2020   HGBA1C 6.0 (H) 06/08/2020   HGBA1C 6.1 (H) 03/15/2020   HGBA1C 5.8 06/10/2017   Lab Results  Component Value Date   INSULIN 18.2 06/08/2020   Lab Results  Component Value Date   TSH 1.300 06/08/2020   Lab Results  Component Value Date   CHOL 155 06/08/2020   HDL 43 06/08/2020   LDLCALC 96 06/08/2020   TRIG 81 06/08/2020   CHOLHDL 3.6 06/08/2020   Lab Results  Component Value Date   VD25OH 34.90 10/31/2020   VD25OH 15.6 (L) 06/08/2020   VD25OH 16.6 (L) 03/15/2020   Lab  Results  Component Value Date   WBC 11.4 (H) 10/31/2020   HGB 11.6 (L) 10/31/2020   HCT 36.8 10/31/2020   MCV 64.4 Repeated and verified X2. (L) 10/31/2020   PLT 360.0 10/31/2020   Lab Results  Component Value Date   IRON 31 06/08/2020   TIBC 409 06/08/2020   FERRITIN 61 06/08/2020   Attestation Statements:   Reviewed by clinician on day of visit: allergies, medications, problem list, medical history, surgical history, family history, social history, and previous encounter notes.   Trude Mcburney, am acting as transcriptionist for [employer], DO.  I have reviewed the above documentation for accuracy and completeness, and I agree with the above. [name], D.O.  The 21st Century Cures Act was signed into law in 2016 which includes the topic of electronic health records.  This provides immediate access to information in MyChart.  This includes consultation notes, operative notes, office notes, lab results and pathology reports.  If you have any questions about what you read please let us know at your next visit so we can discuss your concerns and take corrective action if need be.  We are right here with you.

## 2020-12-13 ENCOUNTER — Other Ambulatory Visit (INDEPENDENT_AMBULATORY_CARE_PROVIDER_SITE_OTHER): Payer: Self-pay | Admitting: Family Medicine

## 2020-12-13 DIAGNOSIS — R7303 Prediabetes: Secondary | ICD-10-CM

## 2020-12-13 NOTE — Telephone Encounter (Signed)
Dr.Opalski ?

## 2020-12-20 ENCOUNTER — Other Ambulatory Visit: Payer: Self-pay

## 2020-12-20 ENCOUNTER — Telehealth (INDEPENDENT_AMBULATORY_CARE_PROVIDER_SITE_OTHER): Payer: No Typology Code available for payment source | Admitting: Family Medicine

## 2020-12-20 ENCOUNTER — Encounter (INDEPENDENT_AMBULATORY_CARE_PROVIDER_SITE_OTHER): Payer: Self-pay | Admitting: Family Medicine

## 2020-12-20 ENCOUNTER — Other Ambulatory Visit (HOSPITAL_COMMUNITY): Payer: Self-pay

## 2020-12-20 DIAGNOSIS — R7303 Prediabetes: Secondary | ICD-10-CM | POA: Diagnosis not present

## 2020-12-20 DIAGNOSIS — Z6841 Body Mass Index (BMI) 40.0 and over, adult: Secondary | ICD-10-CM

## 2020-12-20 DIAGNOSIS — E66813 Obesity, class 3: Secondary | ICD-10-CM

## 2020-12-20 DIAGNOSIS — F3289 Other specified depressive episodes: Secondary | ICD-10-CM | POA: Diagnosis not present

## 2020-12-20 DIAGNOSIS — Z9189 Other specified personal risk factors, not elsewhere classified: Secondary | ICD-10-CM | POA: Diagnosis not present

## 2020-12-20 DIAGNOSIS — E559 Vitamin D deficiency, unspecified: Secondary | ICD-10-CM | POA: Diagnosis not present

## 2020-12-20 MED ORDER — BUPROPION HCL ER (SR) 150 MG PO TB12
150.0000 mg | ORAL_TABLET | Freq: Every morning | ORAL | 0 refills | Status: DC
  Filled 2020-12-20: qty 30, 30d supply, fill #0

## 2020-12-20 MED ORDER — OZEMPIC (0.25 OR 0.5 MG/DOSE) 2 MG/1.5ML ~~LOC~~ SOPN
0.2500 mg | PEN_INJECTOR | SUBCUTANEOUS | 1 refills | Status: DC
  Filled 2020-12-20: qty 1.5, 56d supply, fill #0

## 2020-12-20 MED ORDER — VITAMIN D (ERGOCALCIFEROL) 1.25 MG (50000 UNIT) PO CAPS
50000.0000 [IU] | ORAL_CAPSULE | ORAL | 0 refills | Status: DC
  Filled 2020-12-20: qty 8, 28d supply, fill #0

## 2020-12-21 NOTE — Progress Notes (Signed)
TeleHealth Visit:  Due to the COVID-19 pandemic, this visit was completed with telemedicine (audio/video) technology to reduce patient and provider exposure as well as to preserve personal protective equipment.   Donna Bond has verbally consented to this TeleHealth visit. The patient is located at home, the provider is located at the [facility] office. The participants in this visit include the listed provider and patient. The visit was conducted today via MyChart video.   Chief Complaint: OBESITY Donna Bond is here to discuss her progress with her obesity treatment plan along with follow-up of her obesity related diagnoses. [name] is on the Category 3 Plan and states she is following her eating plan approximately 90% of the time. Donna Bond states she is doing 0 minutes 0 times per week.  Today's visit was #: 11 Starting weight: 325 lbs Starting date: 06/08/2020  Interim History: [name] notes head congestion, sore throat, but no fever or cough. She notes fatigue and she had a negative COVID test 2 times, but she is still not feeling well. She is not eating much but she is trying to stay hydrated. She has no issues with the plan. She thinks she has lost a few pounds.   Subjective:   1. Pre-diabetes Donna Bond is on Ozempic 0.25 mg weekly. She notes some GI side effects and nausea for 1-2 days later with 0.5 mg dose. She would like to stay at 0.25 mg dose. She notes it is helping with her hunger for unhealthy food.  2. Vitamin D deficiency Donna Bond is tolerating medication(s) well without side effects. Medication compliance is good and patient appears to be taking it as prescribed. Denies additional concerns regarding this condition.   3. Other depression, with emotional eating Donna Bond takes Wellbutrin earlier in the day now so she is sleeping much better. She denies issues with emotional eating on medications lately and is well controlled.  4. At risk for impaired metabolic function Donna Bond is at increased  risk for impaired metabolic function due to current nutrition and muscle mass.  Assessment/Plan:  No orders of the defined types were placed in this encounter.   Medications Discontinued During This Encounter  Medication Reason   buPROPion (WELLBUTRIN SR) 150 MG 12 hr tablet Reorder   Semaglutide,0.25 or 0.5MG /DOS, (OZEMPIC, 0.25 OR 0.5 MG/DOSE,) 2 MG/1.5ML SOPN Reorder   Vitamin D, Ergocalciferol, (DRISDOL) 1.25 MG (50000 UNIT) CAPS capsule Reorder     Meds ordered this encounter  Medications   buPROPion (WELLBUTRIN SR) 150 MG 12 hr tablet    Sig: Take 1 tablet (150 mg total) by mouth in the morning.    Dispense:  30 tablet    Refill:  0    30 d supply; ov for rf   Vitamin D, Ergocalciferol, (DRISDOL) 1.25 MG (50000 UNIT) CAPS capsule    Sig: Take 1 capsule (50,000 Units total) by mouth 2 (two) times a week.    Dispense:  8 capsule    Refill:  0    Needs ov for RF   Semaglutide,0.25 or 0.5MG /DOS, (OZEMPIC, 0.25 OR 0.5 MG/DOSE,) 2 MG/1.5ML SOPN    Sig: Inject 0.25 mg into the skin once a week.    Dispense:  1.5 mL    Refill:  1    Needs OV for RF     1. Pre-diabetes Donna Bond will continue Ozempic 0.25 mg weekly, and we will refill for 1 month.  - I reiterated and again counseled patient on pathophysiology of the disease process of  Pre-DM.  - Stressed importance of dietary and lifestyle modifications resulting in weight loss as first line txmnt - in addition we discussed the risks and benefits of various medication options which can help [name] in the management of this disease process as well as with weight loss.  Will consider starting one of these meds in future as will focus on prudent nutritional plan at this time.  - continue to decrease simple carbs; increase fiber and proteins -> follow meal plan  - handouts provided at pt's request after education provided.  All concerns/questions addressed.   - anticipatory guidance given.   - Recheck A1c and fasting insulin level in  approximately 3 months from last check or as deemed fit.   - Semaglutide,0.25 or 0.5MG /DOS, (OZEMPIC, 0.25 OR 0.5 MG/DOSE,) 2 MG/1.5ML SOPN; Inject 0.25 mg into the skin once a week.  Dispense: 1.5 mL; Refill: 1  2. Vitamin D deficiency Donna Bond will continue prescription Vit D, and we will refill for 1 month.  - Discussed importance of vitamin D to their health and well-being.  - possible symptoms of low Vitamin D can be low energy, depressed mood, muscle aches, joint aches, osteoporosis etc. - low Vitamin D levels may be linked to an increased risk of cardiovascular events and even increased risk of cancers- such as colon and breast.  - I recommend pt take a 50,000 IU twice weekly prescription vit D - see script below   - Informed patient this may be a lifelong thing, and she was encouraged to continue to take the medicine until told otherwise.   - we will need to monitor levels regularly (every 3-4 mo on average) to keep levels within normal limits.  - weight loss will likely improve availability of vitamin D, thus encouraged Donna Bond to continue with meal plan and their weight loss efforts to further improve this condition - pt's questions and concerns regarding this condition addressed.  - Vitamin D, Ergocalciferol, (DRISDOL) 1.25 MG (50000 UNIT) CAPS capsule; Take 1 capsule (50,000 Units total) by mouth 2 (two) times a week.  Dispense: 8 capsule; Refill: 0  3. Other depression, with emotional eating Behavior modification techniques were discussed today to help Donna Bond deal with her emotional/non-hunger eating behaviors. We will refill Wellbutrin SR for 1 month. Orders and follow up as documented in patient record.   - buPROPion (WELLBUTRIN SR) 150 MG 12 hr tablet; Take 1 tablet (150 mg total) by mouth in the morning.  Dispense: 30 tablet; Refill: 0  4. At risk for impaired metabolic function Due to Donna Bond's current state of health and medical condition(s), she is at a significantly higher risk for  impaired metabolic function.   At least 8 minutes was spent on counseling Donna Bond about these concerns today.  This places the patient at a much greater risk to subsequently develop cardio-pulmonary conditions that can negatively affect the patient's quality of life.  I stressed the importance of reversing these risks factors.  The initial goal is to lose at least 5-10% of starting weight to help reduce risk factors.  Counseling:  Intensive lifestyle modifications discussed with Khamryn as the most appropriate first line treatment.  she will continue to work on diet, exercise, and weight loss efforts.  We will continue to reassess these conditions on a fairly regular basis in an attempt to decrease the patient's overall morbidity and mortality.  5. Obesity with current BMI of 54.69 Riven is currently in the action stage of change. As such, her goal is  to continue with weight loss efforts. She has agreed to the Category 3 Plan.   Exercise goals: All adults should avoid inactivity. Some physical activity is better than none, and adults who participate in any amount of physical activity gain some health benefits.  Behavioral modification strategies: no skipping meals and planning for success.  Elodie has agreed to follow-up with our clinic in 2 weeks. She was informed of the importance of frequent follow-up visits to maximize her success with intensive lifestyle modifications for her multiple health conditions.  Objective:   VITALS: Per patient if applicable, see vitals. GENERAL: Alert and in no acute distress. CARDIOPULMONARY: No increased WOB. Speaking in clear sentences.  PSYCH: Pleasant and cooperative. Speech normal rate and rhythm. Affect is appropriate. Insight and judgement are appropriate. Attention is focused, linear, and appropriate.  NEURO: Oriented as arrived to appointment on time with no prompting.   Lab Results  Component Value Date   CREATININE 0.76 06/08/2020   BUN 10 06/08/2020   NA 138  06/08/2020   K 4.3 06/08/2020   CL 102 06/08/2020   CO2 19 (L) 06/08/2020   Lab Results  Component Value Date   ALT 13 06/08/2020   AST 15 06/08/2020   ALKPHOS 121 06/08/2020   BILITOT 0.4 06/08/2020   Lab Results  Component Value Date   HGBA1C 6.1 10/31/2020   HGBA1C 6.0 (H) 06/08/2020   HGBA1C 6.1 (H) 03/15/2020   HGBA1C 5.8 06/10/2017   Lab Results  Component Value Date   INSULIN 18.2 06/08/2020   Lab Results  Component Value Date   TSH 1.300 06/08/2020   Lab Results  Component Value Date   CHOL 155 06/08/2020   HDL 43 06/08/2020   LDLCALC 96 06/08/2020   TRIG 81 06/08/2020   CHOLHDL 3.6 06/08/2020   Lab Results  Component Value Date   VD25OH 34.90 10/31/2020   VD25OH 15.6 (L) 06/08/2020   VD25OH 16.6 (L) 03/15/2020   Lab Results  Component Value Date   WBC 11.4 (H) 10/31/2020   HGB 11.6 (L) 10/31/2020   HCT 36.8 10/31/2020   MCV 64.4 Repeated and verified X2. (L) 10/31/2020   PLT 360.0 10/31/2020   Lab Results  Component Value Date   IRON 31 06/08/2020   TIBC 409 06/08/2020   FERRITIN 61 06/08/2020    Attestation Statements:   Reviewed by clinician on day of visit: allergies, medications, problem list, medical history, surgical history, family history, social history, and previous encounter notes.   Trude Mcburney, am acting as transcriptionist for [employer], DO.  I have reviewed the above documentation for accuracy and completeness, and I agree with the above. [name], D.O.  The 21st Century Cures Act was signed into law in 2016 which includes the topic of electronic health records.  This provides immediate access to information in MyChart.  This includes consultation notes, operative notes, office notes, lab results and pathology reports.  If you have any questions about what you read please let us know at your next visit so we can discuss your concerns and take corrective action if need be.  We are right here with you.

## 2021-01-04 ENCOUNTER — Other Ambulatory Visit (HOSPITAL_COMMUNITY): Payer: Self-pay

## 2021-01-04 ENCOUNTER — Ambulatory Visit (INDEPENDENT_AMBULATORY_CARE_PROVIDER_SITE_OTHER): Payer: No Typology Code available for payment source | Admitting: Family Medicine

## 2021-01-04 ENCOUNTER — Encounter (INDEPENDENT_AMBULATORY_CARE_PROVIDER_SITE_OTHER): Payer: Self-pay | Admitting: Family Medicine

## 2021-01-04 ENCOUNTER — Other Ambulatory Visit: Payer: Self-pay

## 2021-01-04 VITALS — BP 121/83 | HR 73 | Temp 97.8°F | Ht 62.0 in | Wt 292.0 lb

## 2021-01-04 DIAGNOSIS — F3289 Other specified depressive episodes: Secondary | ICD-10-CM

## 2021-01-04 DIAGNOSIS — Z9189 Other specified personal risk factors, not elsewhere classified: Secondary | ICD-10-CM | POA: Diagnosis not present

## 2021-01-04 DIAGNOSIS — Z6841 Body Mass Index (BMI) 40.0 and over, adult: Secondary | ICD-10-CM

## 2021-01-04 DIAGNOSIS — R7303 Prediabetes: Secondary | ICD-10-CM | POA: Diagnosis not present

## 2021-01-04 DIAGNOSIS — E559 Vitamin D deficiency, unspecified: Secondary | ICD-10-CM

## 2021-01-04 MED ORDER — OZEMPIC (0.25 OR 0.5 MG/DOSE) 2 MG/1.5ML ~~LOC~~ SOPN
0.5000 mg | PEN_INJECTOR | SUBCUTANEOUS | 0 refills | Status: DC
  Filled 2021-01-04: qty 1.5, 28d supply, fill #0

## 2021-01-04 MED ORDER — BUPROPION HCL ER (SR) 150 MG PO TB12
150.0000 mg | ORAL_TABLET | Freq: Every morning | ORAL | 0 refills | Status: DC
  Filled 2021-01-04: qty 30, 30d supply, fill #0

## 2021-01-04 MED ORDER — VITAMIN D (ERGOCALCIFEROL) 1.25 MG (50000 UNIT) PO CAPS
50000.0000 [IU] | ORAL_CAPSULE | ORAL | 0 refills | Status: DC
  Filled 2021-01-04: qty 8, 28d supply, fill #0

## 2021-01-08 NOTE — Progress Notes (Signed)
Chief Complaint:   OBESITY Donna Bond is here to discuss her progress with her obesity treatment plan along with follow-up of her obesity related diagnoses. Keilee is on the Category 3 Plan and states she is following her eating plan approximately 90% of the time. Ezrah states she is doing 0 minutes 0 times per week.  Today's visit was #: 12 Starting weight: 325 lbs Starting date: 06/08/2020 Today's weight: 292 lbs Today's date: 01/04/2021 Total lbs lost to date: 33 Total lbs lost since last in-office visit: 7  Interim History: Jamieka has been weighing her protein and food and more aware of what she eats. It has been approximately 1 month since her last weigh in. She denies issues with the meal plan or medications.  Subjective:   1. Pre-diabetes Ethel has a diagnosis of prediabetes based on her elevated HgA1c and was informed this puts her at greater risk of developing diabetes. She continues to work on diet and exercise to decrease her risk of diabetes. She denies nausea or hypoglycemia.  2. Vitamin D deficiency Terrilynn is currently taking prescription vitamin D 50,000 IU twice weekly. She denies nausea, vomiting or muscle weakness.  3. Other depression, with emotional eating Kamyia is struggling with emotional eating and using food for comfort to the extent that it is negatively impacting her health. She has been working on behavior modification techniques to help reduce her emotional eating and has been somewhat successful. She shows no sign of suicidal or homicidal ideations.  4. At risk for activity intolerance Magdalina is at risk for exercise intolerance.  Assessment/Plan:  No orders of the defined types were placed in this encounter.   Medications Discontinued During This Encounter  Medication Reason   buPROPion (WELLBUTRIN SR) 150 MG 12 hr tablet Reorder   Vitamin D, Ergocalciferol, (DRISDOL) 1.25 MG (50000 UNIT) CAPS capsule Reorder   Semaglutide,0.25 or 0.5MG /DOS, (OZEMPIC, 0.25 OR 0.5  MG/DOSE,) 2 MG/1.5ML SOPN Reorder     Meds ordered this encounter  Medications   buPROPion (WELLBUTRIN SR) 150 MG 12 hr tablet    Sig: Take 1 tablet (150 mg total) by mouth in the morning.    Dispense:  30 tablet    Refill:  0    30 d supply; ov for rf   Semaglutide,0.25 or 0.5MG /DOS, (OZEMPIC, 0.25 OR 0.5 MG/DOSE,) 2 MG/1.5ML SOPN    Sig: Inject 0.5 mg into the skin once a week.    Dispense:  1.5 mL    Refill:  0    Needs OV for RF   Vitamin D, Ergocalciferol, (DRISDOL) 1.25 MG (50000 UNIT) CAPS capsule    Sig: Take 1 capsule (50,000 Units total) by mouth 2 (two) times a week.    Dispense:  8 capsule    Refill:  0    Needs ov for RF     1. Pre-diabetes Jayelle will continue to work on weight loss, exercise, and decreasing simple carbohydrates to help decrease the risk of diabetes. We will refill Ozempic 0.5 mg for 1 month, at a trial of increased dose of 0.5 mg (up from 0.25 mg).  - Semaglutide,0.25 or 0.5MG /DOS, (OZEMPIC, 0.25 OR 0.5 MG/DOSE,) 2 MG/1.5ML SOPN; Inject 0.5 mg into the skin once a week.  Dispense: 1.5 mL; Refill: 0  2. Vitamin D deficiency Low Vitamin D level contributes to fatigue and are associated with obesity, breast, and colon cancer. We will refill prescription Vitamin D for 1 month. Renad will follow-up for routine testing of  Vitamin D, at least 2-3 times per year to avoid over-replacement.  - Vitamin D, Ergocalciferol, (DRISDOL) 1.25 MG (50000 UNIT) CAPS capsule; Take 1 capsule (50,000 Units total) by mouth 2 (two) times a week.  Dispense: 8 capsule; Refill: 0  3. Other depression, with emotional eating Behavior modification techniques were discussed today to help Sara deal with her emotional/non-hunger eating behaviors. We will refill Wellbutrin SR for 1 month. Orders and follow up as documented in patient record.   - buPROPion (WELLBUTRIN SR) 150 MG 12 hr tablet; Take 1 tablet (150 mg total) by mouth in the morning.  Dispense: 30 tablet; Refill: 0  4. At risk  for activity intolerance Tenecia was given approximately 9 minutes of exercise intolerance counseling today. She is 40 y.o. female and has risk factors exercise intolerance including obesity. We discussed intensive lifestyle modifications today with an emphasis on specific weight loss instructions and strategies. Ajooni will slowly increase activity as tolerated.  Repetitive spaced learning was employed today to elicit superior memory formation and behavioral change.  5. Obesity with current BMI of 53.4 Giah is currently in the action stage of change. As such, her goal is to continue with weight loss efforts. She has agreed to the Category 3 Plan.   Exercise goals: Walk daily.  Behavioral modification strategies: increasing lean protein intake, meal planning and cooking strategies, and planning for success.  Marylyn has agreed to follow-up with our clinic in 3 weeks. She was informed of the importance of frequent follow-up visits to maximize her success with intensive lifestyle modifications for her multiple health conditions.   Objective:   Blood pressure 121/83, pulse 73, temperature 97.8 F (36.6 C), height 5\' 2"  (1.575 m), weight 292 lb (132.5 kg), SpO2 100 %. Body mass index is 53.41 kg/m.  General: Cooperative, alert, well developed, in no acute distress. HEENT: Conjunctivae and lids unremarkable. Cardiovascular: Regular rhythm.  Lungs: Normal work of breathing. Neurologic: No focal deficits.   Lab Results  Component Value Date   CREATININE 0.76 06/08/2020   BUN 10 06/08/2020   NA 138 06/08/2020   K 4.3 06/08/2020   CL 102 06/08/2020   CO2 19 (L) 06/08/2020   Lab Results  Component Value Date   ALT 13 06/08/2020   AST 15 06/08/2020   ALKPHOS 121 06/08/2020   BILITOT 0.4 06/08/2020   Lab Results  Component Value Date   HGBA1C 6.1 10/31/2020   HGBA1C 6.0 (H) 06/08/2020   HGBA1C 6.1 (H) 03/15/2020   HGBA1C 5.8 06/10/2017   Lab Results  Component Value Date   INSULIN 18.2  06/08/2020   Lab Results  Component Value Date   TSH 1.300 06/08/2020   Lab Results  Component Value Date   CHOL 155 06/08/2020   HDL 43 06/08/2020   LDLCALC 96 06/08/2020   TRIG 81 06/08/2020   CHOLHDL 3.6 06/08/2020   Lab Results  Component Value Date   VD25OH 34.90 10/31/2020   VD25OH 15.6 (L) 06/08/2020   VD25OH 16.6 (L) 03/15/2020   Lab Results  Component Value Date   WBC 11.4 (H) 10/31/2020   HGB 11.6 (L) 10/31/2020   HCT 36.8 10/31/2020   MCV 64.4 Repeated and verified X2. (L) 10/31/2020   PLT 360.0 10/31/2020   Lab Results  Component Value Date   IRON 31 06/08/2020   TIBC 409 06/08/2020   FERRITIN 61 06/08/2020   Attestation Statements:   Reviewed by clinician on day of visit: allergies, medications, problem list, medical history, surgical  history, family history, social history, and previous encounter notes.   Trude Mcburney, am acting as transcriptionist for [employer], DO.  I have reviewed the above documentation for accuracy and completeness, and I agree with the above. [name], D.O.  The 21st Century Cures Act was signed into law in 2016 which includes the topic of electronic health records.  This provides immediate access to information in MyChart.  This includes consultation notes, operative notes, office notes, lab results and pathology reports.  If you have any questions about what you read please let us know at your next visit so we can discuss your concerns and take corrective action if need be.  We are right here with you.

## 2021-01-23 ENCOUNTER — Ambulatory Visit (INDEPENDENT_AMBULATORY_CARE_PROVIDER_SITE_OTHER): Payer: No Typology Code available for payment source | Admitting: Family Medicine

## 2021-01-25 ENCOUNTER — Ambulatory Visit (INDEPENDENT_AMBULATORY_CARE_PROVIDER_SITE_OTHER): Payer: No Typology Code available for payment source | Admitting: Family Medicine

## 2021-01-25 ENCOUNTER — Other Ambulatory Visit: Payer: Self-pay

## 2021-01-25 ENCOUNTER — Other Ambulatory Visit (HOSPITAL_COMMUNITY): Payer: Self-pay

## 2021-01-25 ENCOUNTER — Encounter (INDEPENDENT_AMBULATORY_CARE_PROVIDER_SITE_OTHER): Payer: Self-pay | Admitting: Family Medicine

## 2021-01-25 VITALS — BP 119/78 | HR 80 | Temp 98.1°F | Ht 62.0 in | Wt 290.0 lb

## 2021-01-25 DIAGNOSIS — R7303 Prediabetes: Secondary | ICD-10-CM | POA: Diagnosis not present

## 2021-01-25 DIAGNOSIS — E559 Vitamin D deficiency, unspecified: Secondary | ICD-10-CM | POA: Diagnosis not present

## 2021-01-25 DIAGNOSIS — Z6841 Body Mass Index (BMI) 40.0 and over, adult: Secondary | ICD-10-CM

## 2021-01-25 DIAGNOSIS — Z9189 Other specified personal risk factors, not elsewhere classified: Secondary | ICD-10-CM

## 2021-01-25 DIAGNOSIS — F430 Acute stress reaction: Secondary | ICD-10-CM | POA: Diagnosis not present

## 2021-01-25 MED ORDER — BUPROPION HCL ER (SR) 150 MG PO TB12
150.0000 mg | ORAL_TABLET | Freq: Two times a day (BID) | ORAL | 0 refills | Status: DC
  Filled 2021-01-25: qty 60, 30d supply, fill #0

## 2021-01-25 MED ORDER — OZEMPIC (0.25 OR 0.5 MG/DOSE) 2 MG/1.5ML ~~LOC~~ SOPN
0.5000 mg | PEN_INJECTOR | SUBCUTANEOUS | 0 refills | Status: DC
  Filled 2021-01-25 (×2): qty 1.5, 28d supply, fill #0

## 2021-01-25 MED ORDER — VITAMIN D (ERGOCALCIFEROL) 1.25 MG (50000 UNIT) PO CAPS
50000.0000 [IU] | ORAL_CAPSULE | ORAL | 0 refills | Status: DC
  Filled 2021-01-25: qty 8, 28d supply, fill #0

## 2021-01-29 NOTE — Progress Notes (Signed)
Chief Complaint:   OBESITY Donna Bond is here to discuss her progress with her obesity treatment plan along with follow-up of her obesity related diagnoses. Donna Bond is on the Category 3 Plan and states she is following her eating plan approximately 95% of the time. [name] states she is walking 20 minutes 3-4 times per week.  Today's visit was #: 13 Starting weight: 325 lbs Starting date: 06/08/2020 Today's weight: 290 lbs Today's date: 01/25/2021 Total lbs lost to date: 35 Total lbs lost since last in-office visit: 2  Interim History: [name] reports increased stress with work and kids sick at home. She has also had financial challenges lately and struggling a bit. Donna Bond is here for a follow up office visit.  We reviewed her meal plan and questions were answered.  Patient's food recall appears to be accurate and consistent with what is on plan when she is following it.   When eating on plan, her hunger and cravings are well controlled.    Subjective:   1. Stress reaction, with emotional eating Donna Bond is on Wellbutrin. She reports increased stressors, and she feels that increasing dose of Wellbutrin would possibly help. She denies depression or anxiety and is just having emotionally challenging times at this time.  2. Pre-diabetes Donna Bond has a diagnosis of prediabetes based on her elevated HgA1c and was informed this puts her at greater risk of developing diabetes. She continues to work on diet and exercise to decrease her risk of diabetes. She denies nausea or hypoglycemia. Pt denies nausea or vomiting and hunger is well controlled.   Lab Results  Component Value Date   HGBA1C 6.1 10/31/2020   Lab Results  Component Value Date   INSULIN 18.2 06/08/2020   3. Vitamin D deficiency She is currently taking prescription vitamin D 50,000 IU twice a week. She denies nausea, vomiting or muscle weakness.  Lab Results  Component Value Date   VD25OH 34.90 10/31/2020   VD25OH 15.6 (L) 06/08/2020    VD25OH 16.6 (L) 03/15/2020   4. At risk for depression Amandeep is at risk for depression due to increased stress.  Assessment/Plan:  No orders of the defined types were placed in this encounter.   Medications Discontinued During This Encounter  Medication Reason   buPROPion (WELLBUTRIN SR) 150 MG 12 hr tablet Reorder   Semaglutide,0.25 or 0.5MG /DOS, (OZEMPIC, 0.25 OR 0.5 MG/DOSE,) 2 MG/1.5ML SOPN Reorder   Vitamin D, Ergocalciferol, (DRISDOL) 1.25 MG (50000 UNIT) CAPS capsule Reorder     Meds ordered this encounter  Medications   buPROPion (WELLBUTRIN SR) 150 MG 12 hr tablet    Sig: Take 1 tablet (150 mg total) by mouth 2 (two) times daily.    Dispense:  60 tablet    Refill:  0    30 d supply; ov for rf   Vitamin D, Ergocalciferol, (DRISDOL) 1.25 MG (50000 UNIT) CAPS capsule    Sig: Take 1 capsule (50,000 Units total) by mouth 2 (two) times a week.    Dispense:  8 capsule    Refill:  0    Needs ov for RF   Semaglutide,0.25 or 0.5MG /DOS, (OZEMPIC, 0.25 OR 0.5 MG/DOSE,) 2 MG/1.5ML SOPN    Sig: Inject 0.5 mg into the skin once a week.    Dispense:  1.5 mL    Refill:  0    Needs OV for RF     1. Stress reaction, with emotional eating Increase walking to everyday and continue self-care activities. Increase Wellbutrin  150 mg to twice a day.  Increase and Refill- buPROPion (WELLBUTRIN SR) 150 MG 12 hr tablet; Take 1 tablet (150 mg total) by mouth 2 (two) times daily.  Dispense: 60 tablet; Refill: 0  2. Pre-diabetes Donna Bond will continue to work on weight loss, exercise, and decreasing simple carbohydrates to help decrease the risk of diabetes.   Refill- Semaglutide,0.25 or 0.5MG /DOS, (OZEMPIC, 0.25 OR 0.5 MG/DOSE,) 2 MG/1.5ML SOPN; Inject 0.5 mg into the skin once a week.  Dispense: 1.5 mL; Refill: 0  3. Vitamin D deficiency Low Vitamin D level contributes to fatigue and are associated with obesity, breast, and colon cancer. She agrees to continue to take prescription Vitamin D 50,000  IU twice a week and will follow-up for routine testing of Vitamin D, at least 2-3 times per year to avoid over-replacement. Recheck labs in a month.  Refill- Vitamin D, Ergocalciferol, (DRISDOL) 1.25 MG (50000 UNIT) CAPS capsule; Take 1 capsule (50,000 Units total) by mouth 2 (two) times a week.  Dispense: 8 capsule; Refill: 0  4. At risk for depression Donna Bond was given approximately 10 minutes of depression risk counseling today. She has risk factors for depression including increased stress. We discussed the importance of a healthy work life balance, a healthy relationship with food and a good support system.  Repetitive spaced learning was employed today to elicit superior memory formation and behavioral change.  5. Obesity with current BMI of 53.2  Donna Bond is currently in the action stage of change. As such, her goal is to continue with weight loss efforts. She has agreed to the Category 3 Plan.   Exercise goals:  Walk everyday.  Behavioral modification strategies: keeping healthy foods in the home and emotional eating strategies.  Donna Bond has agreed to follow-up with our clinic in 4 weeks. She was informed of the importance of frequent follow-up visits to maximize her success with intensive lifestyle modifications for her multiple health conditions.   Objective:   Blood pressure 119/78, pulse 80, temperature 98.1 F (36.7 C), height 5\' 2"  (1.575 m), weight 290 lb (131.5 kg), SpO2 100 %. Body mass index is 53.04 kg/m.  General: Cooperative, alert, well developed, in no acute distress. HEENT: Conjunctivae and lids unremarkable. Cardiovascular: Regular rhythm.  Lungs: Normal work of breathing. Neurologic: No focal deficits.   Lab Results  Component Value Date   CREATININE 0.76 06/08/2020   BUN 10 06/08/2020   NA 138 06/08/2020   K 4.3 06/08/2020   CL 102 06/08/2020   CO2 19 (L) 06/08/2020   Lab Results  Component Value Date   ALT 13 06/08/2020   AST 15 06/08/2020   ALKPHOS 121  06/08/2020   BILITOT 0.4 06/08/2020   Lab Results  Component Value Date   HGBA1C 6.1 10/31/2020   HGBA1C 6.0 (H) 06/08/2020   HGBA1C 6.1 (H) 03/15/2020   HGBA1C 5.8 06/10/2017   Lab Results  Component Value Date   INSULIN 18.2 06/08/2020   Lab Results  Component Value Date   TSH 1.300 06/08/2020   Lab Results  Component Value Date   CHOL 155 06/08/2020   HDL 43 06/08/2020   LDLCALC 96 06/08/2020   TRIG 81 06/08/2020   CHOLHDL 3.6 06/08/2020   Lab Results  Component Value Date   VD25OH 34.90 10/31/2020   VD25OH 15.6 (L) 06/08/2020   VD25OH 16.6 (L) 03/15/2020   Lab Results  Component Value Date   WBC 11.4 (H) 10/31/2020   HGB 11.6 (L) 10/31/2020   HCT 36.8  10/31/2020   MCV 64.4 Repeated and verified X2. (L) 10/31/2020   PLT 360.0 10/31/2020   Lab Results  Component Value Date   IRON 31 06/08/2020   TIBC 409 06/08/2020   FERRITIN 61 06/08/2020    Attestation Statements:   Reviewed by clinician on day of visit: allergies, medications, problem list, medical history, surgical history, family history, social history, and previous encounter notes.  Edmund Hilda, CMA, am acting as transcriptionist for [employer], DO.  I have reviewed the above documentation for accuracy and completeness, and I agree with the above. [name], D.O.  The 21st Century Cures Act was signed into law in 2016 which includes the topic of electronic health records.  This provides immediate access to information in MyChart.  This includes consultation notes, operative notes, office notes, lab results and pathology reports.  If you have any questions about what you read please let us know at your next visit so we can discuss your concerns and take corrective action if need be.  We are right here with you.

## 2021-02-15 ENCOUNTER — Other Ambulatory Visit (HOSPITAL_COMMUNITY): Payer: Self-pay

## 2021-02-19 ENCOUNTER — Other Ambulatory Visit: Payer: Self-pay

## 2021-02-19 ENCOUNTER — Other Ambulatory Visit (HOSPITAL_COMMUNITY): Payer: Self-pay

## 2021-02-19 ENCOUNTER — Encounter (INDEPENDENT_AMBULATORY_CARE_PROVIDER_SITE_OTHER): Payer: Self-pay | Admitting: Family Medicine

## 2021-02-19 ENCOUNTER — Ambulatory Visit (INDEPENDENT_AMBULATORY_CARE_PROVIDER_SITE_OTHER): Payer: No Typology Code available for payment source | Admitting: Family Medicine

## 2021-02-19 VITALS — BP 114/77 | HR 79 | Temp 97.6°F | Ht 62.0 in | Wt 288.0 lb

## 2021-02-19 DIAGNOSIS — E559 Vitamin D deficiency, unspecified: Secondary | ICD-10-CM

## 2021-02-19 DIAGNOSIS — R7303 Prediabetes: Secondary | ICD-10-CM

## 2021-02-19 DIAGNOSIS — D508 Other iron deficiency anemias: Secondary | ICD-10-CM | POA: Diagnosis not present

## 2021-02-19 DIAGNOSIS — Z9189 Other specified personal risk factors, not elsewhere classified: Secondary | ICD-10-CM | POA: Diagnosis not present

## 2021-02-19 DIAGNOSIS — Z6841 Body Mass Index (BMI) 40.0 and over, adult: Secondary | ICD-10-CM

## 2021-02-19 DIAGNOSIS — F430 Acute stress reaction: Secondary | ICD-10-CM | POA: Diagnosis not present

## 2021-02-19 MED ORDER — OZEMPIC (0.25 OR 0.5 MG/DOSE) 2 MG/1.5ML ~~LOC~~ SOPN
0.5000 mg | PEN_INJECTOR | SUBCUTANEOUS | 0 refills | Status: DC
  Filled 2021-02-19: qty 1.5, 28d supply, fill #0

## 2021-02-19 MED ORDER — VITAMIN D (ERGOCALCIFEROL) 1.25 MG (50000 UNIT) PO CAPS
50000.0000 [IU] | ORAL_CAPSULE | ORAL | 0 refills | Status: DC
  Filled 2021-02-19: qty 8, 28d supply, fill #0

## 2021-02-19 MED ORDER — BUPROPION HCL ER (SR) 150 MG PO TB12
150.0000 mg | ORAL_TABLET | Freq: Two times a day (BID) | ORAL | 0 refills | Status: DC
  Filled 2021-02-19: qty 60, 30d supply, fill #0

## 2021-02-19 NOTE — Progress Notes (Signed)
Chief Complaint:   OBESITY Donna Bond is here to discuss her progress with her obesity treatment plan along with follow-up of her obesity related diagnoses. Donna Bond is on the Category 3 Plan and states she is following her eating plan approximately 75% of the time. Donna Bond states she is walking 10-15 minutes 4-5 times per week.  Today's visit was #: 14 Starting weight: 325 lbs Starting date: 06/08/2020 Today's weight: 288 lbs Today's date: 02/19/2021 Total lbs lost to date: 37 Total lbs lost since last in-office visit: 2  Interim History: Donna Bond's kids have been sick with URI and stomach bugs a lot!. She has been focused on her children and not herself. She occasionally skips meals or is eating "off plan". Pt is trying to increase activities by walking more lately.  Subjective:   1. Pre-diabetes Donna Bond reports Ozempic is helping her stay away from chips and other snacks. She says the current dose is working well and hunger/cravings are controlled.  2. Stress reaction, with emotional eating Pt reports increased dose of Wellbutrin to BID helped a lot.   3. Vitamin D deficiency She is currently taking prescription vitamin D 50,000 IU twice a week. She denies nausea, vomiting or muscle weakness.  4. Other iron deficiency anemia Pt is taking ferrous sulfate twice a day 5 days a week. She has no problem with constipation and takes Miralax once a week on average.  5. At risk for diabetes mellitus Donna Bond is at higher than average risk for developing diabetes due to pre-diabetes.   Assessment/Plan:   Orders Placed This Encounter  Procedures   CBC with Differential/Platelet   Hemoglobin A1c   VITAMIN D 25 Hydroxy (Vit-D Deficiency, Fractures)    Medications Discontinued During This Encounter  Medication Reason   buPROPion (WELLBUTRIN SR) 150 MG 12 hr tablet Reorder   Vitamin D, Ergocalciferol, (DRISDOL) 1.25 MG (50000 UNIT) CAPS capsule Reorder   Semaglutide,0.25 or 0.5MG /DOS, (OZEMPIC, 0.25 OR 0.5  MG/DOSE,) 2 MG/1.5ML SOPN Reorder     Meds ordered this encounter  Medications   buPROPion (WELLBUTRIN SR) 150 MG 12 hr tablet    Sig: Take 1 tablet (150 mg total) by mouth 2 (two) times daily.    Dispense:  60 tablet    Refill:  0    30 d supply; ov for rf   Semaglutide,0.25 or 0.5MG /DOS, (OZEMPIC, 0.25 OR 0.5 MG/DOSE,) 2 MG/1.5ML SOPN    Sig: Inject 0.5 mg into the skin once a week.    Dispense:  1.5 mL    Refill:  0    Needs OV for RF   Vitamin D, Ergocalciferol, (DRISDOL) 1.25 MG (50000 UNIT) CAPS capsule    Sig: Take 1 capsule (50,000 Units total) by mouth 2 (two) times a week.    Dispense:  8 capsule    Refill:  0    Needs ov for RF     1. Pre-diabetes Donna Bond will continue to work on weight loss via prudent nutritional plan, exercise, and decreasing simple carbohydrates to help decrease the risk of diabetes. Check labs at next OV.  Refill- Semaglutide,0.25 or 0.5MG /DOS, (OZEMPIC, 0.25 OR 0.5 MG/DOSE,) 2 MG/1.5ML SOPN; Inject 0.5 mg into the skin once a week.  Dispense: 1.5 mL; Refill: 0  - Hemoglobin A1c  2. Stress reaction, with emotional eating Behavior modification techniques were discussed today to help Donna Bond deal with her emotional/non-hunger eating behaviors.  Orders and follow up as documented in patient record.   Refill- buPROPion [facility] SR)  150 MG 12 hr tablet; Take 1 tablet (150 mg total) by mouth 2 (two) times daily.  Dispense: 60 tablet; Refill: 0  3. Vitamin D deficiency Low Vitamin D level contributes to fatigue and are associated with obesity, breast, and colon cancer. She agrees to continue to take prescription Vitamin D 50,000 IU twice a week and will follow-up for routine testing of Vitamin D, at least 2-3 times per year to avoid over-replacement. Check labs at next OV.  Refill- Vitamin D, Ergocalciferol, (DRISDOL) 1.25 MG (50000 UNIT) CAPS capsule; Take 1 capsule (50,000 Units total) by mouth 2 (two) times a week.  Dispense: 8 capsule; Refill: 0  -  VITAMIN D 25 Hydroxy (Vit-D Deficiency, Fractures)  4. Other iron deficiency anemia Check CBC at next OV. Continue ferrous sulfate twice a day and increase water intake and walking.  - CBC with Differential/Platelet  5. At risk for diabetes mellitus Donna Bond was given approximately 9 minutes of diabetes education and counseling today. We discussed intensive lifestyle modifications today with an emphasis on weight loss as well as increasing exercise and decreasing simple carbohydrates in her diet. We also reviewed medication options with an emphasis on risk versus benefit of those discussed.   Repetitive spaced learning was employed today to elicit superior memory formation and behavioral change.   6. Class 3 severe obesity with serious comorbidity and body mass index (BMI) of 50.0 to 59.9 in adult, unspecified obesity type (HCC)  Donna Bond is currently in the action stage of change. As such, her goal is to continue with weight loss efforts. She has agreed to the Category 3 Plan.   Pt will start setting boundaries with others and put herself first more. She will meal prep on Sunday or Monday.  Exercise goals:  As is  Behavioral modification strategies: increasing lean protein intake, decreasing eating out, and no skipping meals.  Donna Bond has agreed to follow-up with our clinic in 2-3 weeks. She was informed of the importance of frequent follow-up visits to maximize her success with intensive lifestyle modifications for her multiple health conditions.   Objective:   Blood pressure 114/77, pulse 79, temperature 97.6 F (36.4 C), height 5\' 2"  (1.575 m), weight 288 lb (130.6 kg), SpO2 100 %. Body mass index is 52.68 kg/m.  General: Cooperative, alert, well developed, in no acute distress. HEENT: Conjunctivae and lids unremarkable. Cardiovascular: Regular rhythm.  Lungs: Normal work of breathing. Neurologic: No focal deficits.   Lab Results  Component Value Date   CREATININE 0.76 06/08/2020   BUN  10 06/08/2020   NA 138 06/08/2020   K 4.3 06/08/2020   CL 102 06/08/2020   CO2 19 (L) 06/08/2020   Lab Results  Component Value Date   ALT 13 06/08/2020   AST 15 06/08/2020   ALKPHOS 121 06/08/2020   BILITOT 0.4 06/08/2020   Lab Results  Component Value Date   HGBA1C 6.1 10/31/2020   HGBA1C 6.0 (H) 06/08/2020   HGBA1C 6.1 (H) 03/15/2020   HGBA1C 5.8 06/10/2017   Lab Results  Component Value Date   INSULIN 18.2 06/08/2020   Lab Results  Component Value Date   TSH 1.300 06/08/2020   Lab Results  Component Value Date   CHOL 155 06/08/2020   HDL 43 06/08/2020   LDLCALC 96 06/08/2020   TRIG 81 06/08/2020   CHOLHDL 3.6 06/08/2020   Lab Results  Component Value Date   VD25OH 34.90 10/31/2020   VD25OH 15.6 (L) 06/08/2020   VD25OH 16.6 (L) 03/15/2020  Lab Results  Component Value Date   WBC 11.4 (H) 10/31/2020   HGB 11.6 (L) 10/31/2020   HCT 36.8 10/31/2020   MCV 64.4 Repeated and verified X2. (L) 10/31/2020   PLT 360.0 10/31/2020   Lab Results  Component Value Date   IRON 31 06/08/2020   TIBC 409 06/08/2020   FERRITIN 61 06/08/2020   Attestation Statements:   Reviewed by clinician on day of visit: allergies, medications, problem list, medical history, surgical history, family history, social history, and previous encounter notes.  Coral Ceo, CMA, am acting as transcriptionist for [employer], DO.  I have reviewed the above documentation for accuracy and completeness, and I agree with the above. Marjory Sneddon, D.O.  The [place] was signed into law in 2016 which includes the topic of electronic health records.  This provides immediate access to information in MyChart.  This includes consultation notes, operative notes, office notes, lab results and pathology reports.  If you have any questions about what you read please let us know at your next visit so we can discuss your concerns and take corrective action if need be.  We are  right here with you.

## 2021-02-27 ENCOUNTER — Other Ambulatory Visit (HOSPITAL_COMMUNITY): Payer: Self-pay

## 2021-03-02 ENCOUNTER — Encounter (INDEPENDENT_AMBULATORY_CARE_PROVIDER_SITE_OTHER): Payer: Self-pay | Admitting: Family Medicine

## 2021-03-05 ENCOUNTER — Encounter (INDEPENDENT_AMBULATORY_CARE_PROVIDER_SITE_OTHER): Payer: Self-pay | Admitting: Family Medicine

## 2021-03-05 ENCOUNTER — Other Ambulatory Visit (HOSPITAL_COMMUNITY): Payer: Self-pay

## 2021-03-05 ENCOUNTER — Other Ambulatory Visit: Payer: Self-pay

## 2021-03-05 ENCOUNTER — Ambulatory Visit (INDEPENDENT_AMBULATORY_CARE_PROVIDER_SITE_OTHER): Payer: No Typology Code available for payment source | Admitting: Family Medicine

## 2021-03-05 VITALS — BP 117/79 | HR 81 | Temp 97.5°F | Ht 62.0 in | Wt 287.0 lb

## 2021-03-05 DIAGNOSIS — F430 Acute stress reaction: Secondary | ICD-10-CM

## 2021-03-05 DIAGNOSIS — Z6841 Body Mass Index (BMI) 40.0 and over, adult: Secondary | ICD-10-CM

## 2021-03-05 DIAGNOSIS — Z9189 Other specified personal risk factors, not elsewhere classified: Secondary | ICD-10-CM | POA: Diagnosis not present

## 2021-03-05 DIAGNOSIS — R7303 Prediabetes: Secondary | ICD-10-CM | POA: Diagnosis not present

## 2021-03-05 DIAGNOSIS — E559 Vitamin D deficiency, unspecified: Secondary | ICD-10-CM | POA: Diagnosis not present

## 2021-03-05 DIAGNOSIS — D508 Other iron deficiency anemias: Secondary | ICD-10-CM

## 2021-03-05 MED ORDER — VITAMIN D (ERGOCALCIFEROL) 1.25 MG (50000 UNIT) PO CAPS
50000.0000 [IU] | ORAL_CAPSULE | ORAL | 0 refills | Status: DC
  Filled 2021-03-05 – 2021-03-14 (×2): qty 8, 28d supply, fill #0

## 2021-03-05 MED ORDER — SEMAGLUTIDE (1 MG/DOSE) 4 MG/3ML ~~LOC~~ SOPN
1.0000 mg | PEN_INJECTOR | SUBCUTANEOUS | 0 refills | Status: DC
  Filled 2021-03-05 – 2021-03-14 (×2): qty 3, 28d supply, fill #0

## 2021-03-05 MED ORDER — BUPROPION HCL ER (SR) 150 MG PO TB12
150.0000 mg | ORAL_TABLET | Freq: Two times a day (BID) | ORAL | 0 refills | Status: DC
  Filled 2021-03-05 – 2021-03-14 (×2): qty 60, 30d supply, fill #0

## 2021-03-06 LAB — IRON,TIBC AND FERRITIN PANEL
Ferritin: 132 ng/mL (ref 15–150)
Iron Saturation: 14 % — ABNORMAL LOW (ref 15–55)
Iron: 45 ug/dL (ref 27–159)
Total Iron Binding Capacity: 329 ug/dL (ref 250–450)
UIBC: 284 ug/dL (ref 131–425)

## 2021-03-06 LAB — COMPREHENSIVE METABOLIC PANEL
ALT: 16 IU/L (ref 0–32)
AST: 18 IU/L (ref 0–40)
Albumin/Globulin Ratio: 1.2 (ref 1.2–2.2)
Albumin: 4 g/dL (ref 3.8–4.8)
Alkaline Phosphatase: 118 IU/L (ref 44–121)
BUN/Creatinine Ratio: 14 (ref 9–23)
BUN: 12 mg/dL (ref 6–20)
Bilirubin Total: 0.3 mg/dL (ref 0.0–1.2)
CO2: 23 mmol/L (ref 20–29)
Calcium: 8.8 mg/dL (ref 8.7–10.2)
Chloride: 102 mmol/L (ref 96–106)
Creatinine, Ser: 0.85 mg/dL (ref 0.57–1.00)
Globulin, Total: 3.3 g/dL (ref 1.5–4.5)
Glucose: 80 mg/dL (ref 70–99)
Potassium: 4.4 mmol/L (ref 3.5–5.2)
Sodium: 139 mmol/L (ref 134–144)
Total Protein: 7.3 g/dL (ref 6.0–8.5)
eGFR: 89 mL/min/{1.73_m2} (ref >59)

## 2021-03-06 LAB — CBC WITH DIFFERENTIAL/PLATELET
Basophils Absolute: 0.1 10*3/uL (ref 0.0–0.2)
Basos: 1 %
EOS (ABSOLUTE): 0.2 10*3/uL (ref 0.0–0.4)
Eos: 2 %
Hematocrit: 38.6 % (ref 34.0–46.6)
Hemoglobin: 11.7 g/dL (ref 11.1–15.9)
Immature Grans (Abs): 0.1 10*3/uL (ref 0.0–0.1)
Immature Granulocytes: 1 %
Lymphocytes Absolute: 3.1 10*3/uL (ref 0.7–3.1)
Lymphs: 27 %
MCH: 21.3 pg — ABNORMAL LOW (ref 26.6–33.0)
MCHC: 30.3 g/dL — ABNORMAL LOW (ref 31.5–35.7)
MCV: 70 fL — ABNORMAL LOW (ref 79–97)
Monocytes Absolute: 0.5 10*3/uL (ref 0.1–0.9)
Monocytes: 5 %
Neutrophils Absolute: 7.6 10*3/uL — ABNORMAL HIGH (ref 1.4–7.0)
Neutrophils: 64 %
Platelets: 353 10*3/uL (ref 150–450)
RBC: 5.5 x10E6/uL — ABNORMAL HIGH (ref 3.77–5.28)
RDW: 17.6 % — ABNORMAL HIGH (ref 11.7–15.4)
WBC: 11.6 10*3/uL — ABNORMAL HIGH (ref 3.4–10.8)

## 2021-03-06 LAB — HEMOGLOBIN A1C
Est. average glucose Bld gHb Est-mCnc: 117 mg/dL
Hgb A1c MFr Bld: 5.7 % — ABNORMAL HIGH (ref 4.8–5.6)

## 2021-03-06 LAB — VITAMIN D 25 HYDROXY (VIT D DEFICIENCY, FRACTURES): Vit D, 25-Hydroxy: 73.9 ng/mL (ref 30.0–100.0)

## 2021-03-06 NOTE — Progress Notes (Signed)
Chief Complaint:   OBESITY Donna Bond is here to discuss her progress with her obesity treatment plan along with follow-up of her obesity related diagnoses. Donna Bond is on the Category 3 Plan and states she is following her eating plan approximately 85-90% of the time. Donna Bond states she is walking for 10-15 minutes 3-4 times per week.  Today's visit was #: 15 Starting weight: 325 lbs Starting date: 06/08/2020 Today's weight: 287 lbs Today's date: 03/05/2021 Total lbs lost to date: 38 Total lbs lost since last in-office visit: 1  Interim History: Donna Bond has lost 2 lbs in fat mass and gained in muscle mass since her last office visit. She has done well with meal prep.  Subjective:   1. Pre-diabetes Donna Bond notes mild hunger and she is a little anxious about going to her aunt's house for Thanksgiving, and being tempted with all of the "goodies".  2. Vitamin D deficiency Donna Bond is currently taking prescription vitamin D 50,000 IU twice weekly. She denies nausea, vomiting or muscle weakness.  3. Other iron deficiency anemia Donna Bond takes Fe 325 mg once daily about 80% of the time. She is usually not taking it twice daily.  4. Stress reaction, with emotional eating Donna Bond's mood is good, and she is handling her stress better without stress eating. She is tolerating Wellbutrin well, and she denies issues with sleep.  5. At risk for deficient intake of food Donna Bond is at a higher than average risk of deficient intake of food due to occasionally skipping meals and feeding her family instead of herself at times.  Assessment/Plan:   Orders Placed This Encounter  Procedures   Fe+TIBC+Fer   Comprehensive metabolic panel   VITAMIN D 25 Hydroxy (Vit-D Deficiency, Fractures)    Medications Discontinued During This Encounter  Medication Reason   Semaglutide,0.25 or 0.5MG /DOS, (OZEMPIC, 0.25 OR 0.5 MG/DOSE,) 2 MG/1.5ML SOPN    buPROPion (WELLBUTRIN SR) 150 MG 12 hr tablet Reorder   Vitamin D, Ergocalciferol, (DRISDOL)  1.25 MG (50000 UNIT) CAPS capsule Reorder     Meds ordered this encounter  Medications   buPROPion (WELLBUTRIN SR) 150 MG 12 hr tablet    Sig: Take 1 tablet (150 mg total) by mouth 2 (two) times daily.    Dispense:  60 tablet    Refill:  0    30 d supply; ov for rf   Vitamin D, Ergocalciferol, (DRISDOL) 1.25 MG (50000 UNIT) CAPS capsule    Sig: Take 1 capsule (50,000 Units total) by mouth 2 (two) times a week.    Dispense:  8 capsule    Refill:  0    Needs ov for RF   Semaglutide, 1 MG/DOSE, 4 MG/3ML SOPN    Sig: Inject 1 mg as directed once a week.    Dispense:  3 mL    Refill:  0     1. Pre-diabetes We will check labs today. Gaylen agreed to increase Ozempic to 1 mg q daily with no refills. She will continue to work on weight loss, exercise, and decreasing simple carbohydrates to help decrease the risk of diabetes.   - Semaglutide, 1 MG/DOSE, 4 MG/3ML SOPN; Inject 1 mg as directed once a week.  Dispense: 3 mL; Refill: 0 - Comprehensive metabolic panel  2. Vitamin D deficiency Low Vitamin D level contributes to fatigue and are associated with obesity, breast, and colon cancer. We will check labs today, and we will refill prescription Vitamin D for 1 month. Chakara will follow-up for routine  testing of Vitamin D, at least 2-3 times per year to avoid over-replacement.  - Vitamin D, Ergocalciferol, (DRISDOL) 1.25 MG (50000 UNIT) CAPS capsule; Take 1 capsule (50,000 Units total) by mouth 2 (two) times a week.  Dispense: 8 capsule; Refill: 0 - VITAMIN D 25 Hydroxy (Vit-D Deficiency, Fractures)  3. Other iron deficiency anemia We will check labs today. Donna Bond is to try to be consistent with taking her Fe supplement. Orders and follow up as documented in patient record.  Counseling Iron is essential for our bodies to make red blood cells. Reasons that someone may be deficient include: an iron-deficient diet (more likely in those following vegan or vegetarian diets), women with heavy menses,  patients with GI disorders or poor absorption, patients that have had bariatric surgery, frequent blood donors, patients with cancer, and patients with heart disease.   Iron-rich foods include dark leafy greens, red and white meats, eggs, seafood, and beans.   Certain foods and drinks prevent your body from absorbing iron properly. Avoid eating these foods in the same meal as iron-rich foods or with iron supplements. These foods include: coffee, black tea, and red wine; milk, dairy products, and foods that are high in calcium; beans and soybeans; whole grains.  Constipation can be a side effect of iron supplementation. Increased water and fiber intake are helpful. Water goal: > 2 liters/day. Fiber goal: > 25 grams/day.  - Fe+TIBC+Fer  4. Stress reaction, with emotional eating Behavior modification techniques were discussed today to help Donna Bond deal with her emotional/non-hunger eating behaviors. We will refill Wellbutrin SR at the same dose for 1 month. Orders and follow up as documented in patient record.   - buPROPion (WELLBUTRIN SR) 150 MG 12 hr tablet; Take 1 tablet (150 mg total) by mouth 2 (two) times daily.  Dispense: 60 tablet; Refill: 0  5. At risk for deficient intake of food Donna Bond was given approximately 10 minutes of deficit intake of food prevention counseling today. Donna Bond is at risk for eating too few calories based on current food recall. She was encouraged to focus on meeting caloric and protein goals according to her recommended meal plan.   6. Obesity with current BMI of 52.6 Donna Bond is currently in the action stage of change. As such, her goal is to continue with weight loss efforts. She has agreed to the Category 3 Plan.   We will got over labs at her next office visit, and see how she is tolerating the increased dose of Ozempic.  Exercise goals: As is, increase as tolerated.  Behavioral modification strategies: increasing lean protein intake, decreasing simple carbohydrates,  increasing water intake, and holiday eating strategies .  Donna Bond has agreed to follow-up with our clinic in 2 to 3 weeks. She was informed of the importance of frequent follow-up visits to maximize her success with intensive lifestyle modifications for her multiple health conditions.   Donna Bond was informed we would discuss her lab results at her next visit unless there is a critical issue that needs to be addressed sooner. Donna Bond agreed to keep her next visit at the agreed upon time to discuss these results.  Objective:   Blood pressure 117/79, pulse 81, temperature (!) 97.5 F (36.4 C), height 5\' 2"  (1.575 m), weight 287 lb (130.2 kg), SpO2 100 %. Body mass index is 52.49 kg/m.  General: Cooperative, alert, well developed, in no acute distress. HEENT: Conjunctivae and lids unremarkable. Cardiovascular: Regular rhythm.  Lungs: Normal work of breathing. Neurologic: No focal deficits.  Lab Results  Component Value Date   CREATININE 0.85 03/05/2021   BUN 12 03/05/2021   NA 139 03/05/2021   K 4.4 03/05/2021   CL 102 03/05/2021   CO2 23 03/05/2021   Lab Results  Component Value Date   ALT 16 03/05/2021   AST 18 03/05/2021   ALKPHOS 118 03/05/2021   BILITOT 0.3 03/05/2021   Lab Results  Component Value Date   HGBA1C 5.7 (H) 03/05/2021   HGBA1C 6.1 10/31/2020   HGBA1C 6.0 (H) 06/08/2020   HGBA1C 6.1 (H) 03/15/2020   HGBA1C 5.8 06/10/2017   Lab Results  Component Value Date   INSULIN 18.2 06/08/2020   Lab Results  Component Value Date   TSH 1.300 06/08/2020   Lab Results  Component Value Date   CHOL 155 06/08/2020   HDL 43 06/08/2020   LDLCALC 96 06/08/2020   TRIG 81 06/08/2020   CHOLHDL 3.6 06/08/2020   Lab Results  Component Value Date   VD25OH 73.9 03/05/2021   VD25OH 34.90 10/31/2020   VD25OH 15.6 (L) 06/08/2020   Lab Results  Component Value Date   WBC 11.6 (H) 03/05/2021   HGB 11.7 03/05/2021   HCT 38.6 03/05/2021   MCV 70 (L) 03/05/2021   PLT 353  03/05/2021   Lab Results  Component Value Date   IRON 45 03/05/2021   TIBC 329 03/05/2021   FERRITIN 132 03/05/2021   Attestation Statements:   Reviewed by clinician on day of visit: allergies, medications, problem list, medical history, surgical history, family history, social history, and previous encounter notes.   Wilhemena Durie, am acting as transcriptionist for [employer], DO.  I have reviewed the above documentation for accuracy and completeness, and I agree with the above. Marjory Sneddon, D.O.  The [place] was signed into law in 2016 which includes the topic of electronic health records.  This provides immediate access to information in MyChart.  This includes consultation notes, operative notes, office notes, lab results and pathology reports.  If you have any questions about what you read please let us know at your next visit so we can discuss your concerns and take corrective action if need be.  We are right here with you.

## 2021-03-13 ENCOUNTER — Other Ambulatory Visit (HOSPITAL_COMMUNITY): Payer: Self-pay

## 2021-03-14 ENCOUNTER — Other Ambulatory Visit (HOSPITAL_COMMUNITY): Payer: Self-pay

## 2021-03-18 ENCOUNTER — Encounter: Payer: Self-pay | Admitting: Internal Medicine

## 2021-03-19 ENCOUNTER — Encounter: Payer: No Typology Code available for payment source | Admitting: Family Medicine

## 2021-03-20 ENCOUNTER — Ambulatory Visit (INDEPENDENT_AMBULATORY_CARE_PROVIDER_SITE_OTHER): Payer: No Typology Code available for payment source | Admitting: Internal Medicine

## 2021-03-20 ENCOUNTER — Encounter: Payer: Self-pay | Admitting: Internal Medicine

## 2021-03-20 ENCOUNTER — Other Ambulatory Visit: Payer: Self-pay

## 2021-03-20 DIAGNOSIS — M25571 Pain in right ankle and joints of right foot: Secondary | ICD-10-CM | POA: Diagnosis not present

## 2021-03-20 NOTE — Progress Notes (Signed)
   Subjective:   Patient ID: Donna Bond, female    DOB: [birth year], 40 y.o.   MRN: [id number]  HPI The patient is a 40 YO female coming in for problems with feet/ankles swelling and hurting. Started about 6 days ago and not hurting today and less swollen. Took tylenol for this.  Review of Systems  Constitutional: Negative.   HENT: Negative.    Eyes: Negative.   Respiratory:  Negative for cough, chest tightness and shortness of breath.   Cardiovascular:  Negative for chest pain, palpitations and leg swelling.  Gastrointestinal:  Negative for abdominal distention, abdominal pain, constipation, diarrhea, nausea and vomiting.  Musculoskeletal:  Positive for arthralgias, joint swelling and myalgias.  Skin: Negative.   Neurological: Negative.   Psychiatric/Behavioral: Negative.     Objective:  Physical Exam Constitutional:      Appearance: She is well-developed. She is obese.  HENT:     Head: Normocephalic and atraumatic.  Cardiovascular:     Rate and Rhythm: Normal rate and regular rhythm.  Pulmonary:     Effort: Pulmonary effort is normal. No respiratory distress.     Breath sounds: Normal breath sounds. No wheezing or rales.  Abdominal:     General: Bowel sounds are normal. There is no distension.     Palpations: Abdomen is soft.     Tenderness: There is no abdominal tenderness. There is no rebound.  Musculoskeletal:     Cervical back: Normal range of motion.     Comments: No tenderness to palpation right foot/ankle. Some minimal swelling to the right ankle and top of the right foot.   Skin:    General: Skin is warm and dry.  Neurological:     Mental Status: She is alert and oriented to person, place, and time.     Coordination: Coordination normal.    Vitals:   03/20/21 0854  BP: 130/68  Pulse: 78  Resp: 18  SpO2: 100%  Weight: 286 lb 9.6 oz (130 kg)  Height: 5\' 2"  (1.575 m)    This visit occurred during the SARS-CoV-2 public health emergency.  Safety protocols  were in place, including screening questions prior to the visit, additional usage of staff PPE, and extensive cleaning of exam room while observing appropriate contact time as indicated for disinfecting solutions.   Assessment & Plan:  Visit time 15 minutes in face to face communication with patient and coordination of care, additional 5 minutes spent in record review, coordination or care, ordering tests, communicating/referring to other healthcare professionals, documenting in medical records all on the same day of the visit for total time 20 minutes spent on the visit.

## 2021-03-20 NOTE — Assessment & Plan Note (Signed)
No clear trigger. Clinical history not consistent with gout. She has started new part time job standing on her feet more. Advised tylenol and rest. If recurrent consider some shoe inserts to help. Given stretching exercises to do. No x-ray indicated or medications today.

## 2021-03-22 ENCOUNTER — Ambulatory Visit (INDEPENDENT_AMBULATORY_CARE_PROVIDER_SITE_OTHER): Payer: No Typology Code available for payment source | Admitting: Family Medicine

## 2021-03-22 ENCOUNTER — Other Ambulatory Visit: Payer: Self-pay

## 2021-03-22 ENCOUNTER — Other Ambulatory Visit (HOSPITAL_COMMUNITY): Payer: Self-pay

## 2021-03-22 ENCOUNTER — Encounter (INDEPENDENT_AMBULATORY_CARE_PROVIDER_SITE_OTHER): Payer: Self-pay | Admitting: Family Medicine

## 2021-03-22 VITALS — BP 124/87 | HR 92 | Temp 98.1°F | Ht 62.0 in | Wt 287.0 lb

## 2021-03-22 DIAGNOSIS — F430 Acute stress reaction: Secondary | ICD-10-CM | POA: Diagnosis not present

## 2021-03-22 DIAGNOSIS — R7303 Prediabetes: Secondary | ICD-10-CM | POA: Diagnosis not present

## 2021-03-22 DIAGNOSIS — D509 Iron deficiency anemia, unspecified: Secondary | ICD-10-CM

## 2021-03-22 DIAGNOSIS — E559 Vitamin D deficiency, unspecified: Secondary | ICD-10-CM | POA: Diagnosis not present

## 2021-03-22 DIAGNOSIS — Z6841 Body Mass Index (BMI) 40.0 and over, adult: Secondary | ICD-10-CM

## 2021-03-22 DIAGNOSIS — Z9189 Other specified personal risk factors, not elsewhere classified: Secondary | ICD-10-CM

## 2021-03-22 MED ORDER — TIRZEPATIDE 5 MG/0.5ML ~~LOC~~ SOAJ
5.0000 mg | SUBCUTANEOUS | 0 refills | Status: DC
  Filled 2021-03-22: qty 6, 84d supply, fill #0

## 2021-03-22 MED ORDER — PRENATAL PLUS IRON 29-1 MG PO TABS
1.0000 | ORAL_TABLET | Freq: Two times a day (BID) | ORAL | 0 refills | Status: DC
  Filled 2021-03-22: qty 60, 30d supply, fill #0

## 2021-03-22 MED ORDER — BUPROPION HCL ER (SR) 150 MG PO TB12
150.0000 mg | ORAL_TABLET | Freq: Two times a day (BID) | ORAL | 0 refills | Status: DC
  Filled 2021-03-22: qty 60, 30d supply, fill #0

## 2021-03-22 MED ORDER — VITAMIN D (ERGOCALCIFEROL) 1.25 MG (50000 UNIT) PO CAPS
ORAL_CAPSULE | ORAL | 0 refills | Status: DC
  Filled 2021-03-22: qty 6, fill #0

## 2021-03-26 NOTE — Progress Notes (Addendum)
Chief Complaint:   OBESITY Donna Bond is here to discuss her progress with her obesity treatment plan along with follow-up of her obesity related diagnoses. Donna Bond is on the Category 3 Plan with breakfast options and states she is following her eating plan approximately 95% of the time. Donna Bond states she is walking for 10-15 minutes 3-4 times per week.  Today's visit was #: 22 Starting weight: 325 lbs Starting date: 06/08/2020 Today's weight: 287 lbs Today's date: 03/22/2021 Total lbs lost to date: 38 Total lbs lost since last in-office visit: 0, did not gain   Interim History:   Donna Bond is here for a follow up office visit.   She is surprised she didn't lose any weight.   We reviewed her meal plan and questions were answered; she is not sure if she ate enough protein as she is not weighing it.    She has found it difficult to afford food at times and endorses familiarity with a couple of our local food banks here in town that she has used in the past.  Financially it has been more challenging for her.  She works two jobs:  during the day at [employer] at [employer] [address] as [place] and evenings at [employer]. She raises her two boys on her own.    Subjective:   1. Pre-diabetes Donna Bond has a diagnosis of pre-diabetes based on her elevated HgA1c and was informed this puts her at greater risk of developing diabetes. Last A1c was down and improving. She denies carbohydrate cravings or hunger. She continues to work on diet and exercise to decrease her risk of diabetes. I discussed labs with the patient today.  2. Iron deficiency anemia, unspecified iron deficiency anemia type Donna Bond is not a vegetarian. Last CBC was stable. She does not have a history of weight loss surgery. I discussed labs with the patient today.  3. Vitamin D deficiency Donna Bond's last labs were at goal. She was changed to once q 5 days (down from twice weekly). I discussed labs with the patient today.  4. Stress reaction, with emotional  eating Donna Bond is struggling with emotional eating and using food for comfort to the extent that it is negatively impacting her health. She has been working on behavior modification techniques to help reduce her emotional eating and has been somewhat successful. She shows no sign of suicidal or homicidal ideations.  5. At risk for impaired metabolic function Donna Bond is at increased risk for impaired metabolic function due to current nutrition and muscle mass.   Assessment/Plan:   Medications Discontinued During This Encounter  Medication Reason   buPROPion (WELLBUTRIN SR) 150 MG 12 hr tablet Reorder   Vitamin D, Ergocalciferol, (DRISDOL) 1.25 MG (50000 UNIT) CAPS capsule Reorder   Semaglutide, 1 MG/DOSE, 4 MG/3ML SOPN      Meds ordered this encounter  Medications   buPROPion (WELLBUTRIN SR) 150 MG 12 hr tablet    Sig: Take 1 tablet (150 mg total) by mouth 2 (two) times daily.    Dispense:  60 tablet    Refill:  0    30 d supply; ov for rf   Vitamin D, Ergocalciferol, (DRISDOL) 1.25 MG (50000 UNIT) CAPS capsule    Sig: Take 1 capsule by mouth every 5 days    Dispense:  6 capsule    Refill:  0    Needs ov for RF   tirzepatide (MOUNJARO) 5 MG/0.5ML Pen    Sig: Inject 5 mg into the skin  once a week.    Dispense:  6 mL    Refill:  0   Prenatal Vit-Iron Carbonyl-FA (PRENATAL PLUS IRON) 29-1 MG TABS    Sig: Take 1 tablet by mouth 2 (two) times daily.    Dispense:  60 tablet    Refill:  0    Please dispense PNV with FE; ok to dispense one her insurance plan will cover    1. Pre-diabetes Man agreed to discontinue Ozempic, and she will start Mounjaro 5 mg q weekly with no refills. She will continue to work on weight loss, exercise, and decreasing simple carbohydrates to help decrease the risk of diabetes.   - tirzepatide [facility]) 5 MG/0.5ML Pen; Inject 5 mg into the skin once a week.  Dispense: 6 mL; Refill: 0   2. Iron deficiency anemia, unspecified iron deficiency anemia type Donna Bond  agreed to start prenatal vitamins 29-1 mg tablet BID, with no refills. Orders and follow up as documented in patient record.  Counseling Iron is essential for our bodies to make red blood cells.  Reasons that someone may be deficient include: an iron-deficient diet (more likely in those following vegan or vegetarian diets), women with heavy menses, patients with GI disorders or poor absorption, patients that have had bariatric surgery, frequent blood donors, patients with cancer, and patients with heart disease.   Iron-rich foods include dark leafy greens, red and white meats, eggs, seafood, and beans.   Certain foods and drinks prevent your body from absorbing iron properly. Avoid eating these foods in the same meal as iron-rich foods or with iron supplements. These foods include: coffee, black tea, and red wine; milk, dairy products, and foods that are high in calcium; beans and soybeans; whole grains.  Constipation can be a side effect of iron supplementation. Increased water and fiber intake are helpful. Water goal: > 2 liters/day. Fiber goal: > 25 grams/day.  - Prenatal Vit-Iron Carbonyl-FA (PRENATAL PLUS IRON) 29-1 MG TABS; Take 1 tablet by mouth 2 (two) times daily.  Dispense: 60 tablet; Refill: 0   3. Vitamin D deficiency Low Vitamin D level contributes to fatigue and are associated with obesity, breast, and colon cancer. We will refill prescription Vitamin D 50,000 IU every 5 days for 1 month. Donna Bond will follow-up for routine testing of Vitamin D, at least 2-3 times per year to avoid over-replacement.  - Vitamin D, Ergocalciferol, (DRISDOL) 1.25 MG (50000 UNIT) CAPS capsule; Take 1 capsule by mouth every 5 days  Dispense: 6 capsule; Refill: 0   4. Stress reaction, with emotional eating Behavior modification techniques were discussed today to help Donna Bond deal with her emotional/non-hunger eating behaviors. We will refill Wellbutrin SR for 1 month. Orders and follow up as documented in patient  record.   - buPROPion (WELLBUTRIN SR) 150 MG 12 hr tablet; Take 1 tablet (150 mg total) by mouth 2 (two) times daily.  Dispense: 60 tablet; Refill: 0   5. At risk for impaired metabolic function Donna Bond was given approximately 9 minutes of impaired  metabolic function prevention counseling today. We discussed intensive lifestyle modifications today with an emphasis on specific nutrition and exercise instructions and strategies.   Repetitive spaced learning was employed today to elicit superior memory formation and behavioral change.   6. Obesity with current BMI of 52.5 Donna Bond is currently in the action stage of change. As such, her goal is to continue with weight loss efforts. She has agreed to the Category 3 Plan with breakfast options.   Exercise  goals: As is.  Behavioral modification strategies: increasing lean protein intake, decreasing simple carbohydrates, and planning for success.  Donna Bond has agreed to follow-up with our clinic in 2 to 3 weeks. She was informed of the importance of frequent follow-up visits to maximize her success with intensive lifestyle modifications for her multiple health conditions.    Objective:   Blood pressure 124/87, pulse 92, temperature 98.1 F (36.7 C), height 5\' 2"  (1.575 m), weight 287 lb (130.2 kg), SpO2 100 %. Body mass index is 52.49 kg/m.  General: Cooperative, alert, well developed, in no acute distress. HEENT: Conjunctivae and lids unremarkable. Cardiovascular: Regular rhythm.  Lungs: Normal work of breathing. Neurologic: No focal deficits.   Lab Results  Component Value Date   CREATININE 0.85 03/05/2021   BUN 12 03/05/2021   NA 139 03/05/2021   K 4.4 03/05/2021   CL 102 03/05/2021   CO2 23 03/05/2021   Lab Results  Component Value Date   ALT 16 03/05/2021   AST 18 03/05/2021   ALKPHOS 118 03/05/2021   BILITOT 0.3 03/05/2021   Lab Results  Component Value Date   HGBA1C 5.7 (H) 03/05/2021   HGBA1C 6.1 10/31/2020   HGBA1C 6.0 (H)  06/08/2020   HGBA1C 6.1 (H) 03/15/2020   HGBA1C 5.8 06/10/2017   Lab Results  Component Value Date   INSULIN 18.2 06/08/2020   Lab Results  Component Value Date   TSH 1.300 06/08/2020   Lab Results  Component Value Date   CHOL 155 06/08/2020   HDL 43 06/08/2020   LDLCALC 96 06/08/2020   TRIG 81 06/08/2020   CHOLHDL 3.6 06/08/2020   Lab Results  Component Value Date   VD25OH 73.9 03/05/2021   VD25OH 34.90 10/31/2020   VD25OH 15.6 (L) 06/08/2020   Lab Results  Component Value Date   WBC 11.6 (H) 03/05/2021   HGB 11.7 03/05/2021   HCT 38.6 03/05/2021   MCV 70 (L) 03/05/2021   PLT 353 03/05/2021   Lab Results  Component Value Date   IRON 45 03/05/2021   TIBC 329 03/05/2021   FERRITIN 132 03/05/2021   Attestation Statements:   Reviewed by clinician on day of visit: allergies, medications, problem list, medical history, surgical history, family history, social history, and previous encounter notes.   03/07/2021, am acting as transcriptionist for Trude Mcburney, DO.  I have reviewed the above documentation for accuracy and completeness, and I agree with the above. [employer], D.O.  The 21st Century Cures Act was signed into law in 2016 which includes the topic of electronic health records.  This provides immediate access to information in MyChart.  This includes consultation notes, operative notes, office notes, lab results and pathology reports.  If you have any questions about what you read please let 2017 know at your next visit so we can discuss your concerns and take corrective action if need be.  We are right here with you.

## 2021-04-10 ENCOUNTER — Encounter (INDEPENDENT_AMBULATORY_CARE_PROVIDER_SITE_OTHER): Payer: Self-pay | Admitting: Family Medicine

## 2021-04-10 ENCOUNTER — Other Ambulatory Visit (HOSPITAL_COMMUNITY): Payer: Self-pay

## 2021-04-10 ENCOUNTER — Other Ambulatory Visit: Payer: Self-pay

## 2021-04-10 ENCOUNTER — Ambulatory Visit (INDEPENDENT_AMBULATORY_CARE_PROVIDER_SITE_OTHER): Payer: No Typology Code available for payment source | Admitting: Family Medicine

## 2021-04-10 VITALS — BP 117/81 | HR 84 | Temp 98.0°F | Ht 62.0 in | Wt 282.0 lb

## 2021-04-10 DIAGNOSIS — E559 Vitamin D deficiency, unspecified: Secondary | ICD-10-CM

## 2021-04-10 DIAGNOSIS — R7303 Prediabetes: Secondary | ICD-10-CM

## 2021-04-10 DIAGNOSIS — Z9189 Other specified personal risk factors, not elsewhere classified: Secondary | ICD-10-CM | POA: Diagnosis not present

## 2021-04-10 DIAGNOSIS — Z6841 Body Mass Index (BMI) 40.0 and over, adult: Secondary | ICD-10-CM

## 2021-04-10 DIAGNOSIS — F430 Acute stress reaction: Secondary | ICD-10-CM | POA: Diagnosis not present

## 2021-04-10 DIAGNOSIS — E611 Iron deficiency: Secondary | ICD-10-CM

## 2021-04-10 MED ORDER — BUPROPION HCL ER (SR) 150 MG PO TB12
150.0000 mg | ORAL_TABLET | Freq: Two times a day (BID) | ORAL | 0 refills | Status: DC
  Filled 2021-04-10: qty 60, 30d supply, fill #0

## 2021-04-10 MED ORDER — VITAMIN D (ERGOCALCIFEROL) 1.25 MG (50000 UNIT) PO CAPS
ORAL_CAPSULE | ORAL | 0 refills | Status: DC
  Filled 2021-04-10: qty 6, 30d supply, fill #0

## 2021-04-11 NOTE — Progress Notes (Signed)
Chief Complaint:   OBESITY Donna Bond is here to discuss her progress with her obesity treatment plan along with follow-up of her obesity related diagnoses. Donna Bond is on the Category 3 Plan with breakfast options and states she is following her eating plan approximately 60% of the time. Donna Bond states she is walking for 15-20 minutes 4 times per week.  Today's visit was #: 18 Starting weight: 325 lbs Starting date: 06/08/2020 Today's weight: 282 lbs Today's date: 04/10/2021 Total lbs lost to date: 43 Total lbs lost since last in-office visit: 5  Interim History: Donna Bond did more prepping of meals and planning for the day better. At her second job at [employer], she moves a lot and she is on her feet the whole time. She states that when she loses weight, she is more energetic and has less shortness of breath when she is walking up stairs. She purposely tries to move more now.  Subjective:   1. Pre-diabetes Donna Bond is taking Mounjaro 5 mg weekly, and she notes it is working great. She denies hunger or cravings.  2. Iron deficiency Donna Bond takes OTC Fe supplementation daily. She denies GI upset.  3. Vitamin D deficiency Donna Bond's last Vit D level was at goal of 50-70 at the end of the Summer.   4. Stress reaction, with emotional eating Donna Bond struggles with emotional eating and using food for comfort to the extent that it is negatively impacting her health. She has been working on behavior modification techniques to help reduce her emotional eating and her symptoms have been well controlled. She shows no sign of suicidal or homicidal ideations.  5. At risk for activity intolerance Donna Bond is at risk for exercise intolerance.  Assessment/Plan:  No orders of the defined types were placed in this encounter.   Medications Discontinued During This Encounter  Medication Reason   buPROPion (WELLBUTRIN SR) 150 MG 12 hr tablet Reorder   Vitamin D, Ergocalciferol, (DRISDOL) 1.25 MG (50000 UNIT) CAPS capsule Reorder      Meds ordered this encounter  Medications   buPROPion (WELLBUTRIN SR) 150 MG 12 hr tablet    Sig: Take 1 tablet (150 mg total) by mouth 2 (two) times daily.    Dispense:  60 tablet    Refill:  0    30 d supply; ov for rf   Vitamin D, Ergocalciferol, (DRISDOL) 1.25 MG (50000 UNIT) CAPS capsule    Sig: Take 1 capsule by mouth every 5 days    Dispense:  6 capsule    Refill:  0    Needs ov for RF     1. Pre-diabetes Donna Bond will continue Mounjaro 5 mg dose, and she denies a change in dose. She will continue her prudent nutritional plan and exercise.  2. Iron deficiency Donna Bond will continue her Fe supplementation, and we will recheck labs in 3 months or so. Orders and follow up as documented in patient record.  Counseling Iron is essential for our bodies to make red blood cells.  Reasons that someone may be deficient include: an iron-deficient diet (more likely in those following vegan or vegetarian diets), women with heavy menses, patients with GI disorders or poor absorption, patients that have had bariatric surgery, frequent blood donors, patients with cancer, and patients with heart disease.   Iron-rich foods include dark leafy greens, red and white meats, eggs, seafood, and beans.   Certain foods and drinks prevent your body from absorbing iron properly. Avoid eating these foods in the same  meal as iron-rich foods or with iron supplements. These foods include: coffee, black tea, and red wine; milk, dairy products, and foods that are high in calcium; beans and soybeans; whole grains.  Constipation can be a side effect of iron supplementation. Increased water and fiber intake are helpful. Water goal: > 2 liters/day. Fiber goal: > 25 grams/day.  3. Vitamin D deficiency We will refill prescription Vitamin D for 1 month, and we will recheck her level in early February to ensure that there is no over compensation.   - Vitamin D, Ergocalciferol, (DRISDOL) 1.25 MG (50000 UNIT) CAPS capsule; Take 1  capsule by mouth every 5 days  Dispense: 6 capsule; Refill: 0  4. Stress reaction, with emotional eating Behavior modification techniques were discussed today to help Donna Bond deal with her emotional/non-hunger eating behaviors. We will refill Wellbutrin SR for 1 month at the same dose. Orders and follow up as documented in patient record.   - buPROPion (WELLBUTRIN SR) 150 MG 12 hr tablet; Take 1 tablet (150 mg total) by mouth 2 (two) times daily.  Dispense: 60 tablet; Refill: 0  5. At risk for activity intolerance Donna Bond was given approximately 9 minutes of exercise intolerance counseling today. She is 40 y.o. female and has risk factors exercise intolerance including obesity. We discussed intensive lifestyle modifications today with an emphasis on specific weight loss instructions and strategies. Donna Bond will slowly increase activity as tolerated.  Repetitive spaced learning was employed today to elicit superior memory formation and behavioral change.  6. Obesity with current BMI of 51.7 Donna Bond is currently in the action stage of change. As such, her goal is to continue with weight loss efforts. She has agreed to the Category 3 Plan with breakfast options.   Exercise goals: As is.  Behavioral modification strategies: holiday eating strategies , celebration eating strategies, and avoiding temptations.  Donna Bond has agreed to follow-up with our clinic in 3 weeks. She was informed of the importance of frequent follow-up visits to maximize her success with intensive lifestyle modifications for her multiple health conditions.   Objective:   Blood pressure 117/81, pulse 84, temperature 98 F (36.7 C), height 5\' 2"  (1.575 m), weight 282 lb (127.9 kg), SpO2 100 %. Body mass index is 51.58 kg/m.  General: Cooperative, alert, well developed, in no acute distress. HEENT: Conjunctivae and lids unremarkable. Cardiovascular: Regular rhythm.  Lungs: Normal work of breathing. Neurologic: No focal deficits.   Lab  Results  Component Value Date   CREATININE 0.85 03/05/2021   BUN 12 03/05/2021   NA 139 03/05/2021   K 4.4 03/05/2021   CL 102 03/05/2021   CO2 23 03/05/2021   Lab Results  Component Value Date   ALT 16 03/05/2021   AST 18 03/05/2021   ALKPHOS 118 03/05/2021   BILITOT 0.3 03/05/2021   Lab Results  Component Value Date   HGBA1C 5.7 (H) 03/05/2021   HGBA1C 6.1 10/31/2020   HGBA1C 6.0 (H) 06/08/2020   HGBA1C 6.1 (H) 03/15/2020   HGBA1C 5.8 06/10/2017   Lab Results  Component Value Date   INSULIN 18.2 06/08/2020   Lab Results  Component Value Date   TSH 1.300 06/08/2020   Lab Results  Component Value Date   CHOL 155 06/08/2020   HDL 43 06/08/2020   LDLCALC 96 06/08/2020   TRIG 81 06/08/2020   CHOLHDL 3.6 06/08/2020   Lab Results  Component Value Date   VD25OH 73.9 03/05/2021   VD25OH 34.90 10/31/2020   VD25OH 15.6 (L)  06/08/2020   Lab Results  Component Value Date   WBC 11.6 (H) 03/05/2021   HGB 11.7 03/05/2021   HCT 38.6 03/05/2021   MCV 70 (L) 03/05/2021   PLT 353 03/05/2021   Lab Results  Component Value Date   IRON 45 03/05/2021   TIBC 329 03/05/2021   FERRITIN 132 03/05/2021   Attestation Statements:   Reviewed by clinician on day of visit: allergies, medications, problem list, medical history, surgical history, family history, social history, and previous encounter notes.   Trude Mcburney, am acting as transcriptionist for [employer], DO.  I have reviewed the above documentation for accuracy and completeness, and I agree with the above. [name], D.O.  The 21st Century Cures Act was signed into law in 2016 which includes the topic of electronic health records.  This provides immediate access to information in MyChart.  This includes consultation notes, operative notes, office notes, lab results and pathology reports.  If you have any questions about what you read please let us know at your next visit so we can discuss your  concerns and take corrective action if need be.  We are right here with you.

## 2021-05-10 ENCOUNTER — Ambulatory Visit (INDEPENDENT_AMBULATORY_CARE_PROVIDER_SITE_OTHER): Payer: No Typology Code available for payment source | Admitting: Family Medicine

## 2021-05-10 ENCOUNTER — Other Ambulatory Visit: Payer: Self-pay

## 2021-05-10 ENCOUNTER — Other Ambulatory Visit (HOSPITAL_COMMUNITY): Payer: Self-pay

## 2021-05-10 ENCOUNTER — Encounter (INDEPENDENT_AMBULATORY_CARE_PROVIDER_SITE_OTHER): Payer: Self-pay | Admitting: Family Medicine

## 2021-05-10 VITALS — BP 109/73 | HR 82 | Temp 98.1°F | Ht 62.0 in | Wt 278.0 lb

## 2021-05-10 DIAGNOSIS — D508 Other iron deficiency anemias: Secondary | ICD-10-CM | POA: Diagnosis not present

## 2021-05-10 DIAGNOSIS — R7303 Prediabetes: Secondary | ICD-10-CM

## 2021-05-10 DIAGNOSIS — E559 Vitamin D deficiency, unspecified: Secondary | ICD-10-CM | POA: Diagnosis not present

## 2021-05-10 DIAGNOSIS — F430 Acute stress reaction: Secondary | ICD-10-CM | POA: Diagnosis not present

## 2021-05-10 DIAGNOSIS — Z6841 Body Mass Index (BMI) 40.0 and over, adult: Secondary | ICD-10-CM

## 2021-05-10 DIAGNOSIS — Z9189 Other specified personal risk factors, not elsewhere classified: Secondary | ICD-10-CM

## 2021-05-10 DIAGNOSIS — E669 Obesity, unspecified: Secondary | ICD-10-CM

## 2021-05-10 MED ORDER — BUPROPION HCL ER (SR) 150 MG PO TB12
150.0000 mg | ORAL_TABLET | Freq: Two times a day (BID) | ORAL | 0 refills | Status: DC
  Filled 2021-05-10: qty 60, 30d supply, fill #0

## 2021-05-10 MED ORDER — FERROUS SULFATE 325 (65 FE) MG PO TABS: 325.0000 mg | ORAL_TABLET | Freq: Two times a day (BID) | ORAL | 0 refills | Status: DC

## 2021-05-10 MED ORDER — VITAMIN D (ERGOCALCIFEROL) 1.25 MG (50000 UNIT) PO CAPS
50000.0000 [IU] | ORAL_CAPSULE | ORAL | 0 refills | Status: DC
  Filled 2021-05-10: qty 6, 30d supply, fill #0

## 2021-05-10 NOTE — Progress Notes (Signed)
Chief Complaint:   OBESITY Donna Bond is here to discuss her progress with her obesity treatment plan along with follow-up of her obesity related diagnoses. Winona is on the Category 3 Plan with breakfast options and states she is following her eating plan approximately 90% of the time. Skyleen states she is walking 15-20 minutes 3-4 times per week.  Today's visit was #: 18 Starting weight: 325 lbs Starting date: 06/08/2020 Today's weight: 278 lbs Today's date: 05/10/2021 Total lbs lost to date: 34 Total lbs lost since last in-office visit: 4  Interim History: Keyundra Vickrey is here for a follow up office visit.  We reviewed her meal plan and questions were answered.  Patient's food recall appears to be accurate and consistent with what is on plan when she is following it.   When eating on plan, her hunger and cravings are well controlled.   Pt did great over the holidays and lost 4 lbs.  Subjective:   1. Other iron deficiency anemia Pt has been very good at taking her iron supplement at least twice a day.   2. Stress reaction, with emotional eating Pt takes Wellbutrin around 7 AM and 2nd dose around 1-2 PM. She is sleeping well and stress is pretty well controlled. Pt is less snappy.  3. Pre-diabetes Pt is tolerating Mounjaro well. It is really helping with staying away from "off-plan" foods.  4. Vitamin D deficiency She is currently taking prescription vitamin D 50,000 IU each week. She denies nausea, vomiting or muscle weakness.  5. At risk for dehydration Marja is at risk for dehydration due to inadequate intake.  Assessment/Plan:  No orders of the defined types were placed in this encounter.   Medications Discontinued During This Encounter  Medication Reason   ferrous sulfate 325 (65 FE) MG tablet Reorder   buPROPion (WELLBUTRIN SR) 150 MG 12 hr tablet Reorder   Vitamin D, Ergocalciferol, (DRISDOL) 1.25 MG (50000 UNIT) CAPS capsule Reorder     Meds ordered this encounter   Medications   buPROPion (WELLBUTRIN SR) 150 MG 12 hr tablet    Sig: Take 1 tablet (150 mg total) by mouth 2 (two) times daily.    Dispense:  60 tablet    Refill:  0    30 d supply; ov for rf   ferrous sulfate 325 (65 FE) MG tablet    Sig: Take 1 tablet (325 mg total) by mouth 2 (two) times daily with a meal.    Dispense:  60 tablet    Refill:  0   Vitamin D, Ergocalciferol, (DRISDOL) 1.25 MG (50000 UNIT) CAPS capsule    Sig: Take 1 capsule (50,000 Units total) by mouth every 5 days    Dispense:  6 capsule    Refill:  0    Needs ov for RF     1. Other iron deficiency anemia Orders and follow up as documented in patient record. Recheck CBC and iron panel mid February. Continue iron supplement BID.  Counseling Iron is essential for our bodies to make red blood cells.  Reasons that someone may be deficient include: an iron-deficient diet (more likely in those following vegan or vegetarian diets), women with heavy menses, patients with GI disorders or poor absorption, patients that have had bariatric surgery, frequent blood donors, patients with cancer, and patients with heart disease.   Iron-rich foods include dark leafy greens, red and white meats, eggs, seafood, and beans.   Certain foods and drinks prevent your body from  absorbing iron properly. Avoid eating these foods in the same meal as iron-rich foods or with iron supplements. These foods include: coffee, black tea, and red wine; milk, dairy products, and foods that are high in calcium; beans and soybeans; whole grains.  Constipation can be a side effect of iron supplementation. Increased water and fiber intake are helpful. Water goal: > 2 liters/day. Fiber goal: > 25 grams/day.  Refill- ferrous sulfate 325 (65 FE) MG tablet; Take 1 tablet (325 mg total) by mouth 2 (two) times daily with a meal.  Dispense: 60 tablet; Refill: 0  2. Stress reaction, with emotional eating Behavior modification techniques were discussed today to help  Stephenie deal with her emotional/non-hunger eating behaviors.  Orders and follow up as documented in patient record.   Refill- buPROPion (WELLBUTRIN SR) 150 MG 12 hr tablet; Take 1 tablet (150 mg total) by mouth 2 (two) times daily.  Dispense: 60 tablet; Refill: 0  3. Pre-diabetes Zane will continue to work on weight loss, exercise, and decreasing simple carbohydrates to help decrease the risk of diabetes. Continue Mounjaro but take one 5 mg injection every 3-4 days for 2 weeks. Recheck A1c mid February.  4. Vitamin D deficiency Low Vitamin D level contributes to fatigue and are associated with obesity, breast, and colon cancer. She agrees to continue to take prescription Vitamin D 50,000 IU every 5 days and will follow-up for routine testing of Vitamin D, at least 2-3 times per year to avoid over-replacement.  Refill- Vitamin D, Ergocalciferol, (DRISDOL) 1.25 MG (50000 UNIT) CAPS capsule; Take 1 capsule (50,000 Units total) by mouth every 5 days  Dispense: 6 capsule; Refill: 0  5. At risk for dehydration Ravan was given approximately 9 minutes dehydration prevention counseling today. Vanity is at risk for dehydration due to weight loss and current medication(s). She was encouraged to hydrate and monitor fluid status to avoid dehydration as well as weight loss plateaus.   6. Obesity with current BMI of 51.0  Reyah is currently in the action stage of change. As such, her goal is to continue with weight loss efforts. She has agreed to the Category 3 Plan with breakfast options.   Exercise goals:  As is  Behavioral modification strategies: decreasing eating out, meal planning and cooking strategies, keeping healthy foods in the home, and planning for success.  Jeanann has agreed to follow-up with our clinic in 3 weeks. She was informed of the importance of frequent follow-up visits to maximize her success with intensive lifestyle modifications for her multiple health conditions.   Objective:   Blood pressure  109/73, pulse 82, temperature 98.1 F (36.7 C), height 5\' 2"  (1.575 m), weight 278 lb (126.1 kg), SpO2 100 %. Body mass index is 50.85 kg/m.  General: Cooperative, alert, well developed, in no acute distress. HEENT: Conjunctivae and lids unremarkable. Cardiovascular: Regular rhythm.  Lungs: Normal work of breathing. Neurologic: No focal deficits.   Lab Results  Component Value Date   CREATININE 0.85 03/05/2021   BUN 12 03/05/2021   NA 139 03/05/2021   K 4.4 03/05/2021   CL 102 03/05/2021   CO2 23 03/05/2021   Lab Results  Component Value Date   ALT 16 03/05/2021   AST 18 03/05/2021   ALKPHOS 118 03/05/2021   BILITOT 0.3 03/05/2021   Lab Results  Component Value Date   HGBA1C 5.7 (H) 03/05/2021   HGBA1C 6.1 10/31/2020   HGBA1C 6.0 (H) 06/08/2020   HGBA1C 6.1 (H) 03/15/2020   HGBA1C 5.8  06/10/2017   Lab Results  Component Value Date   INSULIN 18.2 06/08/2020   Lab Results  Component Value Date   TSH 1.300 06/08/2020   Lab Results  Component Value Date   CHOL 155 06/08/2020   HDL 43 06/08/2020   LDLCALC 96 06/08/2020   TRIG 81 06/08/2020   CHOLHDL 3.6 06/08/2020   Lab Results  Component Value Date   VD25OH 73.9 03/05/2021   VD25OH 34.90 10/31/2020   VD25OH 15.6 (L) 06/08/2020   Lab Results  Component Value Date   WBC 11.6 (H) 03/05/2021   HGB 11.7 03/05/2021   HCT 38.6 03/05/2021   MCV 70 (L) 03/05/2021   PLT 353 03/05/2021   Lab Results  Component Value Date   IRON 45 03/05/2021   TIBC 329 03/05/2021   FERRITIN 132 03/05/2021     Attestation Statements:   Reviewed by clinician on day of visit: allergies, medications, problem list, medical history, surgical history, family history, social history, and previous encounter notes.  Coral Ceo, CMA, am acting as transcriptionist for [employer], DO.  I have reviewed the above documentation for accuracy and completeness, and I agree with the above. Marjory Sneddon, D.O.  The  [place] was signed into law in 2016 which includes the topic of electronic health records.  This provides immediate access to information in MyChart.  This includes consultation notes, operative notes, office notes, lab results and pathology reports.  If you have any questions about what you read please let us know at your next visit so we can discuss your concerns and take corrective action if need be.  We are right here with you.

## 2021-05-18 ENCOUNTER — Other Ambulatory Visit (HOSPITAL_COMMUNITY): Payer: Self-pay

## 2021-05-28 ENCOUNTER — Ambulatory Visit (INDEPENDENT_AMBULATORY_CARE_PROVIDER_SITE_OTHER): Payer: No Typology Code available for payment source | Admitting: Family Medicine

## 2021-05-28 ENCOUNTER — Other Ambulatory Visit (HOSPITAL_COMMUNITY): Payer: Self-pay

## 2021-05-28 ENCOUNTER — Encounter (INDEPENDENT_AMBULATORY_CARE_PROVIDER_SITE_OTHER): Payer: Self-pay | Admitting: Family Medicine

## 2021-05-28 ENCOUNTER — Other Ambulatory Visit: Payer: Self-pay

## 2021-05-28 VITALS — BP 126/83 | HR 83 | Temp 98.4°F | Ht 62.0 in | Wt 282.0 lb

## 2021-05-28 DIAGNOSIS — E669 Obesity, unspecified: Secondary | ICD-10-CM | POA: Diagnosis not present

## 2021-05-28 DIAGNOSIS — R7303 Prediabetes: Secondary | ICD-10-CM | POA: Diagnosis not present

## 2021-05-28 DIAGNOSIS — E559 Vitamin D deficiency, unspecified: Secondary | ICD-10-CM

## 2021-05-28 DIAGNOSIS — Z9189 Other specified personal risk factors, not elsewhere classified: Secondary | ICD-10-CM

## 2021-05-28 DIAGNOSIS — Z6841 Body Mass Index (BMI) 40.0 and over, adult: Secondary | ICD-10-CM

## 2021-05-28 DIAGNOSIS — F39 Unspecified mood [affective] disorder: Secondary | ICD-10-CM | POA: Diagnosis not present

## 2021-05-28 MED ORDER — BUPROPION HCL ER (SR) 150 MG PO TB12
150.0000 mg | ORAL_TABLET | Freq: Two times a day (BID) | ORAL | 0 refills | Status: DC
  Filled 2021-05-28: qty 60, 30d supply, fill #0

## 2021-05-28 MED ORDER — VITAMIN D (ERGOCALCIFEROL) 1.25 MG (50000 UNIT) PO CAPS
50000.0000 [IU] | ORAL_CAPSULE | ORAL | 0 refills | Status: DC
  Filled 2021-05-28: qty 6, 30d supply, fill #0

## 2021-05-28 MED ORDER — TIRZEPATIDE 7.5 MG/0.5ML ~~LOC~~ SOAJ
7.5000 mg | SUBCUTANEOUS | 0 refills | Status: DC
  Filled 2021-05-28: qty 2, 28d supply, fill #0

## 2021-05-29 NOTE — Progress Notes (Signed)
Chief Complaint:   OBESITY Tritia is here to discuss her progress with her obesity treatment plan along with follow-up of her obesity related diagnoses. Bryton is on the Category 3 Plan with breakfast options and states she is following her eating plan approximately 90% of the time. Mabrey states she is walking for 20 minutes 3 times per week.  Today's visit was #: 56 Starting weight: 325 lbs Starting date: 06/08/2020 Today's weight: 282 lbs Today's date: 05/28/2021 Total lbs lost to date: 43 Total lbs lost since last in-office visit: 0  Interim History: Brittay has been stretched thin with caring for family and herself lately. She is unable to meal prep and plan like she would like to.  Subjective:   1. Pre-diabetes Danilyn has a diagnosis of prediabetes based on her elevated HgA1c and was informed this puts her at greater risk of developing diabetes. She continues to work on diet and exercise to decrease her risk of diabetes. She denies nausea or hypoglycemia.  2. Vitamin D deficiency Shyane has been taking her Ergocalciferol twice weekly still instead of every 5 days.  3. 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 It can be difficult and more stress, but she denies stress eating.  Assessment/Plan:   1. Pre-diabetes Shiann agreed to increase Mounjaro to 7.5 mg (up from 5 mg) weekly with no refills. She will continue to work on weight loss, exercise, and decreasing simple carbohydrates to help decrease the risk of diabetes.   - tirzepatide (MOUNJARO) 7.5 MG/0.5ML Pen; Inject 7.5 mg into the skin once a week.  Dispense: 2 mL; Refill: 0  2. Vitamin D deficiency We will refill prescription Vitamin D for 1 month. Bayley will follow-up for routine testing of Vitamin D, at least 2-3 times per year to avoid over-replacement.  - Vitamin D, Ergocalciferol, (DRISDOL) 1.25 MG (50000 UNIT) CAPS capsule; Take 1 capsule (50,000  Units total) by mouth every 5 days  Dispense: 6 capsule; Refill: 0  3. 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  Orders and follow up as documented in patient record.   - buPROPion (WELLBUTRIN SR) 150 MG 12 hr tablet; Take 1 tablet (150 mg total) by mouth 2 (two) times daily.  Dispense: 60 tablet; Refill: 0  4. At risk for drug side effect Due to [facility] current conditions and medications, she is at a higher risk for drug side effect.  At least 9 minutes was spent on counseling her about these concerns today.  We discussed the benefits and potential risks of these medications, and all of patient's concerns were addressed and questions were answered.  she will call us, or their PCP or other specialists who treat their conditions with medications, with any questions or concerns that may develop.    5. Obesity with current BMI of 51.6 Reyann is currently in the action stage of change. As such, her goal is to continue with weight loss efforts. She has agreed to the Category 3 Plan with breakfast options.   Meal prep/plan on Mondays, focus on increasing protein.  Exercise goals: As is.  Behavioral modification strategies: increasing lean protein intake, decreasing simple carbohydrates, no skipping meals, meal planning and cooking strategies, and planning for success.  Iwalani has agreed to follow-up with our clinic in 3 weeks. She was informed of the importance  of frequent follow-up visits to maximize her success with intensive lifestyle modifications for her multiple health conditions.   Objective:   Blood pressure 126/83, pulse 83, temperature 98.4 F (36.9 C), height 5\' 2"  (1.575 m), weight 282 lb (127.9 kg), SpO2 100 %. Body mass index is 51.58 kg/m.  General: Cooperative, alert, well developed, in no acute distress. HEENT: Conjunctivae and lids unremarkable. Cardiovascular: Regular rhythm.  Lungs: Normal work of  breathing. Neurologic: No focal deficits.   Lab Results  Component Value Date   CREATININE 0.85 03/05/2021   BUN 12 03/05/2021   NA 139 03/05/2021   K 4.4 03/05/2021   CL 102 03/05/2021   CO2 23 03/05/2021   Lab Results  Component Value Date   ALT 16 03/05/2021   AST 18 03/05/2021   ALKPHOS 118 03/05/2021   BILITOT 0.3 03/05/2021   Lab Results  Component Value Date   HGBA1C 5.7 (H) 03/05/2021   HGBA1C 6.1 10/31/2020   HGBA1C 6.0 (H) 06/08/2020   HGBA1C 6.1 (H) 03/15/2020   HGBA1C 5.8 06/10/2017   Lab Results  Component Value Date   INSULIN 18.2 06/08/2020   Lab Results  Component Value Date   TSH 1.300 06/08/2020   Lab Results  Component Value Date   CHOL 155 06/08/2020   HDL 43 06/08/2020   LDLCALC 96 06/08/2020   TRIG 81 06/08/2020   CHOLHDL 3.6 06/08/2020   Lab Results  Component Value Date   VD25OH 73.9 03/05/2021   VD25OH 34.90 10/31/2020   VD25OH 15.6 (L) 06/08/2020   Lab Results  Component Value Date   WBC 11.6 (H) 03/05/2021   HGB 11.7 03/05/2021   HCT 38.6 03/05/2021   MCV 70 (L) 03/05/2021   PLT 353 03/05/2021   Lab Results  Component Value Date   IRON 45 03/05/2021   TIBC 329 03/05/2021   FERRITIN 132 03/05/2021   Attestation Statements:   Reviewed by clinician on day of visit: allergies, medications, problem list, medical history, surgical history, family history, social history, and previous encounter notes.   Wilhemena Durie, am acting as transcriptionist for [employer], DO.  I have reviewed the above documentation for accuracy and completeness, and I agree with the above. Marjory Sneddon, D.O.  The [place] was signed into law in 2016 which includes the topic of electronic health records.  This provides immediate access to information in MyChart.  This includes consultation notes, operative notes, office notes, lab results and pathology reports.  If you have any questions about what you read please let us  know at your next visit so we can discuss your concerns and take corrective action if need be.  We are right here with you.

## 2021-06-18 ENCOUNTER — Ambulatory Visit (INDEPENDENT_AMBULATORY_CARE_PROVIDER_SITE_OTHER): Payer: No Typology Code available for payment source | Admitting: Family Medicine

## 2021-06-18 ENCOUNTER — Other Ambulatory Visit: Payer: Self-pay

## 2021-06-18 ENCOUNTER — Other Ambulatory Visit (HOSPITAL_COMMUNITY): Payer: Self-pay

## 2021-06-18 ENCOUNTER — Encounter (INDEPENDENT_AMBULATORY_CARE_PROVIDER_SITE_OTHER): Payer: Self-pay | Admitting: Family Medicine

## 2021-06-18 VITALS — BP 117/77 | HR 85 | Temp 98.1°F | Ht 62.0 in | Wt 275.0 lb

## 2021-06-18 DIAGNOSIS — Z9189 Other specified personal risk factors, not elsewhere classified: Secondary | ICD-10-CM | POA: Diagnosis not present

## 2021-06-18 DIAGNOSIS — E559 Vitamin D deficiency, unspecified: Secondary | ICD-10-CM

## 2021-06-18 DIAGNOSIS — F39 Unspecified mood [affective] disorder: Secondary | ICD-10-CM | POA: Diagnosis not present

## 2021-06-18 DIAGNOSIS — D508 Other iron deficiency anemias: Secondary | ICD-10-CM

## 2021-06-18 DIAGNOSIS — Z6841 Body Mass Index (BMI) 40.0 and over, adult: Secondary | ICD-10-CM

## 2021-06-18 DIAGNOSIS — R7303 Prediabetes: Secondary | ICD-10-CM | POA: Diagnosis not present

## 2021-06-18 DIAGNOSIS — D649 Anemia, unspecified: Secondary | ICD-10-CM | POA: Insufficient documentation

## 2021-06-18 DIAGNOSIS — E669 Obesity, unspecified: Secondary | ICD-10-CM

## 2021-06-18 MED ORDER — TIRZEPATIDE 7.5 MG/0.5ML ~~LOC~~ SOAJ
7.5000 mg | SUBCUTANEOUS | 0 refills | Status: DC
  Filled 2021-06-18: qty 2, 28d supply, fill #0

## 2021-06-18 MED ORDER — BUPROPION HCL ER (SR) 150 MG PO TB12
150.0000 mg | ORAL_TABLET | Freq: Two times a day (BID) | ORAL | 0 refills | Status: DC
  Filled 2021-06-18: qty 60, 30d supply, fill #0

## 2021-06-18 MED ORDER — FERROUS SULFATE 325 (65 FE) MG PO TABS: 325.0000 mg | ORAL_TABLET | Freq: Two times a day (BID) | ORAL | 0 refills | Status: DC

## 2021-06-18 MED ORDER — VITAMIN D (ERGOCALCIFEROL) 1.25 MG (50000 UNIT) PO CAPS
50000.0000 [IU] | ORAL_CAPSULE | ORAL | 0 refills | Status: DC
  Filled 2021-06-18: qty 6, 30d supply, fill #0

## 2021-06-18 NOTE — Progress Notes (Signed)
Chief Complaint:   OBESITY Donna Bond is here to discuss her progress with her obesity treatment plan along with follow-up of her obesity related diagnoses. Donna Bond is on the Category 3 Plan with breakfast options and states she is following her eating plan approximately 85% of the time. Donna Bond states she is walking 30 minutes 1 times per week.  Today's visit was #: 20 Starting weight: 325 lbs Starting date: 06/08/2020 Today's weight: 275 lbs Today's date: 06/18/2021 Total lbs lost to date: 50 Total lbs lost since last in-office visit: 7  Interim History: Pt followed the plan a little closer. She gained 1.8 lbs in muscle and lost 9 lbs in fat mass. She increased her protein intake and has been meal prepping. Pt denies hunger unless she skips meals. She has no cravings.  Subjective:   1. 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  Medication compliance is good and patient appears to be taking it as prescribed.  The patient denies additional concerns regarding this condition.  Pt has been decreasing simple carbs and increasing movement, which she feels has helped her mood. Medication: Wellbutrin  2. Pre-diabetes Pt denies hunger or cravings. Medication: Mounjaro  3. Other iron deficiency anemia Pt is taking meds more frequently and denies constipation. She is drinking a lot of water- over 100 oz per day.  4. Vitamin D deficiency Donna Bond is tolerating medication(s) well without side effects.  Medication compliance is good and patient appears to be taking it as prescribed.  The patient denies additional concerns regarding this condition.  Medication: Ergocalciferol  5. At risk for diabetes mellitus Donna Bond is at higher than average risk for developing diabetes due to her obesity.  Assessment/Plan:   Orders Placed This Encounter  Procedures   Comprehensive metabolic panel   CBC with Differential/Platelet   Hemoglobin A1c   Insulin, random    VITAMIN D 25 Hydroxy (Vit-D Deficiency, Fractures)   TSH   Iron, TIBC and Ferritin Panel    Medications Discontinued During This Encounter  Medication Reason   Prenatal Vit-Iron Carbonyl-FA (PRENATAL PLUS IRON) 29-1 MG TABS    ferrous sulfate 325 (65 FE) MG tablet Reorder   buPROPion (WELLBUTRIN SR) 150 MG 12 hr tablet Reorder   Vitamin D, Ergocalciferol, (DRISDOL) 1.25 MG (50000 UNIT) CAPS capsule Reorder   tirzepatide (MOUNJARO) 7.5 MG/0.5ML Pen Reorder     Meds ordered this encounter  Medications   buPROPion (WELLBUTRIN SR) 150 MG 12 hr tablet    Sig: Take 1 tablet (150 mg total) by mouth 2 (two) times daily.    Dispense:  60 tablet    Refill:  0    30 d supply; ov for rf   ferrous sulfate 325 (65 FE) MG tablet    Sig: Take 1 tablet (325 mg total) by mouth 2 (two) times daily with a meal.    Dispense:  60 tablet    Refill:  0   tirzepatide (MOUNJARO) 7.5 MG/0.5ML Pen    Sig: Inject 7.5 mg into the skin once a week.    Dispense:  2 mL    Refill:  0   Vitamin D, Ergocalciferol, (DRISDOL) 1.25 MG (50000 UNIT) CAPS capsule    Sig: Take 1 capsule (50,000 Units total) by mouth every 5 days    Dispense:  6 capsule    Refill:  0    Needs ov for RF     1. Mood disorder ([place]) with emotional  eating Sxs controlled. Continue current treatment plan. Behavior modification techniques were discussed today to help Donna Bond deal with her emotional/non-hunger eating behaviors.  Orders and follow up as documented in patient record.   Refill- buPROPion (WELLBUTRIN SR) 150 MG 12 hr tablet; Take 1 tablet (150 mg total) by mouth 2 (two) times daily.  Dispense: 60 tablet; Refill: 0 - TSH  2. Pre-diabetes Donna Bond will continue to work on weight loss, exercise, and decreasing simple carbohydrates to help decrease the risk of diabetes. Pt aware that meds will not be covered in the future by insurance. Repeat labs in near future.  Refill- tirzepatide (MOUNJARO) 7.5 MG/0.5ML Pen; Inject 7.5 mg into the  skin once a week.  Dispense: 2 mL; Refill: 0  - Comprehensive metabolic panel - Hemoglobin A1c - Insulin, random  3. Other iron deficiency anemia Continue current treatment plan. Repeat labs at next OV.  Refill- ferrous sulfate 325 (65 FE) MG tablet; Take 1 tablet (325 mg total) by mouth 2 (two) times daily with a meal.  Dispense: 60 tablet; Refill: 0  - CBC with Differential/Platelet - Iron, TIBC and Ferritin Panel  4. Vitamin D deficiency Low Vitamin D level contributes to fatigue and are associated with obesity, breast, and colon cancer. She agrees to continue to take prescription Vitamin D 50,000 IU every 5 days and will follow-up for routine testing of Vitamin D, at least 2-3 times per year to avoid over-replacement. Repeat labs at next OV, as last time level was above goal and we decreased dose.  Refill- Vitamin D, Ergocalciferol, (DRISDOL) 1.25 MG (50000 UNIT) CAPS capsule; Take 1 capsule (50,000 Units total) by mouth every 5 days  Dispense: 6 capsule; Refill: 0  - VITAMIN D 25 Hydroxy (Vit-D Deficiency, Fractures)  5. At risk for diabetes mellitus - Donna Bond was given diabetes prevention education and counseling today of more than 15 minutes.  - Counseled patient on pathophysiology of disease and meaning/ implication of lab results.  - Reviewed how certain foods can either stimulate or inhibit insulin release, and subsequently affect hunger pathways  - Importance of following a healthy meal plan with limiting amounts of simple carbohydrates discussed with patient - Effects of regular aerobic exercise on blood sugar regulation reviewed and encouraged an eventual goal of 30 min 5d/week or more as a minimum.  - Briefly discussed treatment options, which always include dietary and lifestyle modification as first line.   - Handouts provided at patient's desire and/or told to go online to the American Diabetes Association website for further information.  6. Obesity with current BMI of  50.3 Donna Bond is currently in the action stage of change. As such, her goal is to continue with weight loss efforts. She has agreed to the Category 3 Plan with breakfast options.   Exercise goals:  Increase walking to 30 minutes 3 days a week.  Behavioral modification strategies: increasing lean protein intake, meal planning and cooking strategies, and planning for success.  Donna Bond has agreed to follow-up with our clinic in 3 weeks, fasting for labs. She was informed of the importance of frequent follow-up visits to maximize her success with intensive lifestyle modifications for her multiple health conditions.   Objective:   Blood pressure 117/77, pulse 85, temperature 98.1 F (36.7 C), height 5\' 2"  (1.575 m), weight 275 lb (124.7 kg), SpO2 100 %. Body mass index is 50.3 kg/m.  General: Cooperative, alert, well developed, in no acute distress. HEENT: Conjunctivae and lids unremarkable. Cardiovascular: Regular rhythm.  Lungs: Normal  work of breathing. Neurologic: No focal deficits.   Lab Results  Component Value Date   CREATININE 0.85 03/05/2021   BUN 12 03/05/2021   NA 139 03/05/2021   K 4.4 03/05/2021   CL 102 03/05/2021   CO2 23 03/05/2021   Lab Results  Component Value Date   ALT 16 03/05/2021   AST 18 03/05/2021   ALKPHOS 118 03/05/2021   BILITOT 0.3 03/05/2021   Lab Results  Component Value Date   HGBA1C 5.7 (H) 03/05/2021   HGBA1C 6.1 10/31/2020   HGBA1C 6.0 (H) 06/08/2020   HGBA1C 6.1 (H) 03/15/2020   HGBA1C 5.8 06/10/2017   Lab Results  Component Value Date   INSULIN 18.2 06/08/2020   Lab Results  Component Value Date   TSH 1.300 06/08/2020   Lab Results  Component Value Date   CHOL 155 06/08/2020   HDL 43 06/08/2020   LDLCALC 96 06/08/2020   TRIG 81 06/08/2020   CHOLHDL 3.6 06/08/2020   Lab Results  Component Value Date   VD25OH 73.9 03/05/2021   VD25OH 34.90 10/31/2020   VD25OH 15.6 (L) 06/08/2020   Lab Results  Component Value Date   WBC 11.6  (H) 03/05/2021   HGB 11.7 03/05/2021   HCT 38.6 03/05/2021   MCV 70 (L) 03/05/2021   PLT 353 03/05/2021   Lab Results  Component Value Date   IRON 45 03/05/2021   TIBC 329 03/05/2021   FERRITIN 132 03/05/2021   Attestation Statements:   Reviewed by clinician on day of visit: allergies, medications, problem list, medical history, surgical history, family history, social history, and previous encounter notes.  Coral Ceo, CMA, am acting as transcriptionist for [employer], DO.  I have reviewed the above documentation for accuracy and completeness, and I agree with the above. Marjory Sneddon, D.O.  The [place] was signed into law in 2016 which includes the topic of electronic health records.  This provides immediate access to information in MyChart.  This includes consultation notes, operative notes, office notes, lab results and pathology reports.  If you have any questions about what you read please let us know at your next visit so we can discuss your concerns and take corrective action if need be.  We are right here with you.

## 2021-06-19 ENCOUNTER — Other Ambulatory Visit (HOSPITAL_COMMUNITY): Payer: Self-pay

## 2021-06-21 ENCOUNTER — Other Ambulatory Visit (HOSPITAL_COMMUNITY): Payer: Self-pay

## 2021-06-26 ENCOUNTER — Other Ambulatory Visit (HOSPITAL_COMMUNITY): Payer: Self-pay

## 2021-06-26 ENCOUNTER — Encounter: Payer: Self-pay | Admitting: Internal Medicine

## 2021-06-26 MED ORDER — FLUTICASONE PROPIONATE 50 MCG/ACT NA SUSP
2.0000 | Freq: Every day | NASAL | 6 refills | Status: DC
  Filled 2021-06-26: qty 16, 30d supply, fill #0

## 2021-07-04 ENCOUNTER — Other Ambulatory Visit (HOSPITAL_COMMUNITY): Payer: Self-pay

## 2021-07-09 ENCOUNTER — Other Ambulatory Visit (HOSPITAL_COMMUNITY): Payer: Self-pay

## 2021-07-09 ENCOUNTER — Encounter (INDEPENDENT_AMBULATORY_CARE_PROVIDER_SITE_OTHER): Payer: Self-pay | Admitting: Family Medicine

## 2021-07-09 ENCOUNTER — Ambulatory Visit (INDEPENDENT_AMBULATORY_CARE_PROVIDER_SITE_OTHER): Payer: No Typology Code available for payment source | Admitting: Family Medicine

## 2021-07-09 ENCOUNTER — Other Ambulatory Visit: Payer: Self-pay

## 2021-07-09 VITALS — BP 113/78 | HR 73 | Temp 97.4°F | Ht 62.0 in | Wt 279.0 lb

## 2021-07-09 DIAGNOSIS — F39 Unspecified mood [affective] disorder: Secondary | ICD-10-CM | POA: Diagnosis not present

## 2021-07-09 DIAGNOSIS — E669 Obesity, unspecified: Secondary | ICD-10-CM

## 2021-07-09 DIAGNOSIS — R7303 Prediabetes: Secondary | ICD-10-CM

## 2021-07-09 DIAGNOSIS — Z6841 Body Mass Index (BMI) 40.0 and over, adult: Secondary | ICD-10-CM

## 2021-07-09 DIAGNOSIS — Z9189 Other specified personal risk factors, not elsewhere classified: Secondary | ICD-10-CM

## 2021-07-09 DIAGNOSIS — E559 Vitamin D deficiency, unspecified: Secondary | ICD-10-CM

## 2021-07-09 MED ORDER — BUPROPION HCL ER (SR) 150 MG PO TB12
150.0000 mg | ORAL_TABLET | Freq: Two times a day (BID) | ORAL | 0 refills | Status: DC
  Filled 2021-07-09 – 2021-07-16 (×2): qty 60, 30d supply, fill #0

## 2021-07-09 MED ORDER — TIRZEPATIDE 7.5 MG/0.5ML ~~LOC~~ SOAJ
7.5000 mg | SUBCUTANEOUS | 0 refills | Status: DC
Start: 2021-07-09 — End: 2021-07-09
  Filled 2021-07-09: qty 2, 28d supply, fill #0

## 2021-07-09 MED ORDER — TIRZEPATIDE 7.5 MG/0.5ML ~~LOC~~ SOAJ
7.5000 mg | SUBCUTANEOUS | 0 refills | Status: DC
  Filled 2021-07-09: qty 6, 84d supply, fill #0
  Filled 2021-07-12: qty 2, 28d supply, fill #0

## 2021-07-09 MED ORDER — VITAMIN D (ERGOCALCIFEROL) 1.25 MG (50000 UNIT) PO CAPS
50000.0000 [IU] | ORAL_CAPSULE | ORAL | 0 refills | Status: DC
  Filled 2021-07-09: qty 6, 28d supply, fill #0

## 2021-07-10 LAB — CBC WITH DIFFERENTIAL/PLATELET
Basophils Absolute: 0.1 10*3/uL (ref 0.0–0.2)
Basos: 1 %
EOS (ABSOLUTE): 0.2 10*3/uL (ref 0.0–0.4)
Eos: 1 %
Hematocrit: 38.8 % (ref 34.0–46.6)
Hemoglobin: 11.7 g/dL (ref 11.1–15.9)
Immature Grans (Abs): 0.1 10*3/uL (ref 0.0–0.1)
Immature Granulocytes: 1 %
Lymphocytes Absolute: 3.2 10*3/uL — ABNORMAL HIGH (ref 0.7–3.1)
Lymphs: 27 %
MCH: 21.5 pg — ABNORMAL LOW (ref 26.6–33.0)
MCHC: 30.2 g/dL — ABNORMAL LOW (ref 31.5–35.7)
MCV: 72 fL — ABNORMAL LOW (ref 79–97)
Monocytes Absolute: 0.6 10*3/uL (ref 0.1–0.9)
Monocytes: 5 %
Neutrophils Absolute: 7.7 10*3/uL — ABNORMAL HIGH (ref 1.4–7.0)
Neutrophils: 65 %
Platelets: 312 10*3/uL (ref 150–450)
RBC: 5.43 x10E6/uL — ABNORMAL HIGH (ref 3.77–5.28)
RDW: 16.2 % — ABNORMAL HIGH (ref 11.7–15.4)
WBC: 11.8 10*3/uL — ABNORMAL HIGH (ref 3.4–10.8)

## 2021-07-10 LAB — COMPREHENSIVE METABOLIC PANEL
ALT: 28 IU/L (ref 0–32)
AST: 23 IU/L (ref 0–40)
Albumin/Globulin Ratio: 1.4 (ref 1.2–2.2)
Albumin: 3.9 g/dL (ref 3.8–4.8)
Alkaline Phosphatase: 118 IU/L (ref 44–121)
BUN/Creatinine Ratio: 24 — ABNORMAL HIGH (ref 9–23)
BUN: 20 mg/dL (ref 6–24)
Bilirubin Total: 0.3 mg/dL (ref 0.0–1.2)
CO2: 23 mmol/L (ref 20–29)
Calcium: 8.7 mg/dL (ref 8.7–10.2)
Chloride: 107 mmol/L — ABNORMAL HIGH (ref 96–106)
Creatinine, Ser: 0.83 mg/dL (ref 0.57–1.00)
Globulin, Total: 2.8 g/dL (ref 1.5–4.5)
Glucose: 76 mg/dL (ref 70–99)
Potassium: 4.3 mmol/L (ref 3.5–5.2)
Sodium: 141 mmol/L (ref 134–144)
Total Protein: 6.7 g/dL (ref 6.0–8.5)
eGFR: 91 mL/min/{1.73_m2} (ref >59)

## 2021-07-10 LAB — VITAMIN D 25 HYDROXY (VIT D DEFICIENCY, FRACTURES): Vit D, 25-Hydroxy: 80.6 ng/mL (ref 30.0–100.0)

## 2021-07-10 LAB — IRON,TIBC AND FERRITIN PANEL
Ferritin: 135 ng/mL (ref 15–150)
Iron Saturation: 12 % — ABNORMAL LOW (ref 15–55)
Iron: 40 ug/dL (ref 27–159)
Total Iron Binding Capacity: 332 ug/dL (ref 250–450)
UIBC: 292 ug/dL (ref 131–425)

## 2021-07-10 LAB — TSH: TSH: 1.18 u[IU]/mL (ref 0.450–4.500)

## 2021-07-10 LAB — HEMOGLOBIN A1C
Est. average glucose Bld gHb Est-mCnc: 114 mg/dL
Hgb A1c MFr Bld: 5.6 % (ref 4.8–5.6)

## 2021-07-10 LAB — INSULIN, RANDOM: INSULIN: 15.5 u[IU]/mL (ref 2.6–24.9)

## 2021-07-12 ENCOUNTER — Other Ambulatory Visit (HOSPITAL_COMMUNITY): Payer: Self-pay

## 2021-07-12 ENCOUNTER — Encounter (INDEPENDENT_AMBULATORY_CARE_PROVIDER_SITE_OTHER): Payer: Self-pay | Admitting: Family Medicine

## 2021-07-12 MED ORDER — TIRZEPATIDE 10 MG/0.5ML ~~LOC~~ SOAJ
10.0000 mg | SUBCUTANEOUS | 0 refills | Status: DC
  Filled 2021-07-12: qty 2, 28d supply, fill #0

## 2021-07-12 NOTE — Progress Notes (Signed)
? ? ? ?Chief Complaint:  ? ?OBESITY ?Donna Bond is here to discuss her progress with her obesity treatment plan along with follow-up of her obesity related diagnoses. [name] is on the Category 3 Plan with breakfast options and states she is following her eating plan approximately 90% of the time. Chaise states she is walking for 20-30 minutes 3-4 times per week. ? ?Today's visit was #: 21 ?Starting weight: 325 lbs ?Starting date: 06/08/2020 ?Today's weight: 279 lbs ?Today's date: 07/09/2021 ?Total lbs lost to date: 46 lbs ?Total lbs lost since last in-office visit: +4 lbs ? ?Interim History: [name] says she got a car - was a little stressful for a couple of weeks until her car broke down.  She also forgot to take Wellbutrin for a few days, which increased her hunger as well. ? ?Subjective:  ? ?1. Pre-diabetes ?Some increase in carb cravings and thus hunger has subsequently increased as well. ? ?2. Vitamin D deficiency ?She is currently taking prescription vitamin D 50,000 IU each week. She denies nausea, vomiting or muscle weakness. ? 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  No side effects.  Helping a lot with hunger and cravings. ? ?4. At risk for deficient intake of food ?[name] is at risk for deficient intake of food due to skipping meals when money gets tight for her family. ? ?Assessment/Plan:  ?No orders of the defined types were placed in this encounter. ? ? ?Medications Discontinued During This Encounter  ?Medication Reason  ? buPROPion (WELLBUTRIN SR) 150 MG 12 hr tablet Reorder  ? tirzepatide (MOUNJARO) 7.5 MG/0.5ML Pen Reorder  ? Vitamin D, Ergocalciferol, (DRISDOL) 1.25 MG (50000 UNIT) CAPS capsule Reorder  ? tirzepatide (MOUNJARO) 7.5 MG/0.5ML Pen   ?  ? ?Meds ordered this encounter  ?Medications  ? buPROPion (WELLBUTRIN SR) 150 MG 12 hr tablet  ?  Sig: Take 1 tablet (150 mg total) by mouth 2 (two) times daily.  ?  Dispense:  60 tablet  ?  Refill:  0  ?  30 d supply; ov for rf  ?  DISCONTD: tirzepatide (MOUNJARO) 7.5 MG/0.5ML Pen  ?  Sig: Inject 7.5 mg into the skin once a week.  ?  Dispense:  2 mL  ?  Refill:  0  ? Vitamin D, Ergocalciferol, (DRISDOL) 1.25 MG (50000 UNIT) CAPS capsule  ?  Sig: Take 1 capsule (50,000 Units total) by mouth every 5 days  ?  Dispense:  6 capsule  ?  Refill:  0  ?  Needs ov for RF  ? DISCONTD: tirzepatide (MOUNJARO) 7.5 MG/0.5ML Pen  ?  Sig: Inject 7.5 mg into the skin once a week.  ?  Dispense:  6 mL  ?  Refill:  0  ?  Please DO NOT dispense less than 73mL.  ? tirzepatide [facility]) 10 MG/0.5ML Pen  ?  Sig: Inject 10 mg into the skin once a week.  ?  Dispense:  6 mL  ?  Refill:  0  ?  ? ?1. Pre-diabetes ?Increase Mounjaro to 10 mg once weekly. I reiterated and again counseled patient on pathophysiology of the disease process of I.R. and Pre-DM.  ?Stressed importance of dietary and lifestyle modifications to result in weight loss as first line txmnt ?In addition, we discussed the risks and benefits of medication options which can help [name] in the management of this disease process as well as with weight loss.  We increased her dose today of mounjaro ?Continue to decrease  simple carbs; increase fiber and proteins -> follow meal plan  ?Handouts provided at pt's request after education provided.  All concerns/questions addressed.   ?Anticipatory guidance given.   ?- We will recheck A1c and fasting insulin level in approximately 3 months from last check, or as deemed appropriate.  ? ?  ? ?- Increase tirzepatide (MOUNJARO) 7.5 MG/0.5ML Pen; Inject 7.5 mg into the skin once a week.  Dispense: 6 mL; Refill: 0 ? ?2. Vitamin D deficiency ?Low Vitamin D level contributes to fatigue and are associated with obesity, breast, and colon cancer. She agrees to continue to take prescription Vitamin D @50 ,000 IU every week and will follow-up for routine testing of Vitamin D, at least 2-3 times per year to avoid over-replacement. ? ?- Refill Vitamin D, Ergocalciferol, (DRISDOL) 1.25 MG  (50000 UNIT) CAPS capsule; Take 1 capsule (50,000 Units total) by mouth every 5 days  Dispense: 6 capsule; Refill: 0 ? 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  Stable mood. ? ?- Refill buPROPion (WELLBUTRIN SR) 150 MG 12 hr tablet; Take 1 tablet (150 mg total) by mouth 2 (two) times daily.  Dispense: 60 tablet; Refill: 0 ? ?4. At risk for deficient intake of food ?[name] was given approximately 15 minutes of deficient intake of food prevention counseling today. [name] is at risk for eating too few calories based on current food recall. She was encouraged to focus on meeting caloric and protein goals according to her recommended meal plan. ? ?5. Obesity with current BMI of 51.1 ? ?[name] is currently in the action stage of change. As such, her goal is to continue with weight loss efforts. She has agreed to the Category 3 Plan.  ? ?Will obtain labs today and discuss at our follow-up office visit. ? ?Exercise goals:  As is.  Increase as tolerated. ? ?Behavioral modification strategies: no skipping meals and planning for success. ? ?[name] has agreed to follow-up with our clinic in 3 weeks. She was informed of the importance of frequent follow-up visits to maximize her success with intensive lifestyle modifications for her multiple health conditions.  ? ?[name] was informed we would discuss her lab results at her next visit unless there is a critical issue that needs to be addressed sooner. [name] agreed to keep her next visit at the agreed upon time to discuss these results. ? ?Objective:  ? ?Blood pressure 113/78, pulse 73, temperature (!) 97.4 ?F (36.3 ?C), height 5\' 2"  (1.575 m), weight 279 lb (126.6 kg), SpO2 100 %. ?Body mass index is 51.03 kg/m?. ? ?General: Cooperative, alert, well developed, in no acute distress. ?HEENT: Conjunctivae and lids unremarkable. ?Cardiovascular: Regular rhythm.  ?Lungs: Normal work of breathing. ?Neurologic: No focal deficits.  ? ?Lab Results  ?Component Value Date  ?  CREATININE 0.83 07/09/2021  ? BUN 20 07/09/2021  ? NA 141 07/09/2021  ? K 4.3 07/09/2021  ? CL 107 (H) 07/09/2021  ? CO2 23 07/09/2021  ? ?Lab Results  ?Component Value Date  ? ALT 28 07/09/2021  ? AST 23 07/09/2021  ? ALKPHOS 118 07/09/2021  ? BILITOT 0.3 07/09/2021  ? ?Lab Results  ?Component Value Date  ? HGBA1C 5.6 07/09/2021  ? HGBA1C 5.7 (H) 03/05/2021  ? HGBA1C 6.1 10/31/2020  ? HGBA1C 6.0 (H) 06/08/2020  ? HGBA1C 6.1 (H) 03/15/2020  ? ?Lab Results  ?Component Value Date  ? INSULIN 15.5 07/09/2021  ? INSULIN 18.2 06/08/2020  ? ?Lab Results  ?Component Value Date  ? TSH  1.180 07/09/2021  ? ?Lab Results  ?Component Value Date  ? CHOL 155 06/08/2020  ? HDL 43 06/08/2020  ? [place] 96 06/08/2020  ? TRIG 81 06/08/2020  ? CHOLHDL 3.6 06/08/2020  ? ?Lab Results  ?Component Value Date  ? VD25OH 80.6 07/09/2021  ? VD25OH 73.9 03/05/2021  ? VD25OH 34.90 10/31/2020  ? ?Lab Results  ?Component Value Date  ? WBC 11.8 (H) 07/09/2021  ? HGB 11.7 07/09/2021  ? HCT 38.8 07/09/2021  ? MCV 72 (L) 07/09/2021  ? PLT 312 07/09/2021  ? ?Lab Results  ?Component Value Date  ? IRON 40 07/09/2021  ? TIBC 332 07/09/2021  ? FERRITIN 135 07/09/2021  ? ?Attestation Statements:  ? ?Reviewed by clinician on day of visit: allergies, medications, problem list, medical history, surgical history, family history, social history, and previous encounter notes. ? ?I, Water quality scientist, CMA, am acting as transcriptionist for [employer], DO. ? ?I have reviewed the above documentation for accuracy and completeness, and I agree with the above. Marjory Sneddon, D.O. ? ?The [place] was signed into law in 2016 which includes the topic of electronic health records.  This provides immediate access to information in MyChart.  This includes consultation notes, operative notes, office notes, lab results and pathology reports.  If you have any questions about what you read please let us know at your next visit so we can discuss your concerns  and take corrective action if need be.  We are right here with you. ? ?

## 2021-07-12 NOTE — Telephone Encounter (Signed)
Dr.Opalski ?

## 2021-07-13 ENCOUNTER — Other Ambulatory Visit (HOSPITAL_COMMUNITY): Payer: Self-pay

## 2021-07-16 ENCOUNTER — Other Ambulatory Visit (HOSPITAL_COMMUNITY): Payer: Self-pay

## 2021-07-19 ENCOUNTER — Other Ambulatory Visit (HOSPITAL_COMMUNITY): Payer: Self-pay

## 2021-07-19 ENCOUNTER — Other Ambulatory Visit (INDEPENDENT_AMBULATORY_CARE_PROVIDER_SITE_OTHER): Payer: Self-pay | Admitting: Family Medicine

## 2021-07-19 ENCOUNTER — Other Ambulatory Visit (HOSPITAL_BASED_OUTPATIENT_CLINIC_OR_DEPARTMENT_OTHER): Payer: Self-pay

## 2021-07-19 MED ORDER — TIRZEPATIDE 12.5 MG/0.5ML ~~LOC~~ SOAJ
12.5000 mg | SUBCUTANEOUS | 1 refills | Status: DC
  Filled 2021-07-19: qty 6, 84d supply, fill #0

## 2021-07-19 NOTE — Progress Notes (Signed)
Message sent to pt notifying her the medication has been sent ?

## 2021-07-20 ENCOUNTER — Other Ambulatory Visit (HOSPITAL_BASED_OUTPATIENT_CLINIC_OR_DEPARTMENT_OTHER): Payer: Self-pay

## 2021-07-24 ENCOUNTER — Other Ambulatory Visit (HOSPITAL_COMMUNITY): Payer: Self-pay

## 2021-08-06 ENCOUNTER — Ambulatory Visit (INDEPENDENT_AMBULATORY_CARE_PROVIDER_SITE_OTHER): Payer: No Typology Code available for payment source | Admitting: Family Medicine

## 2021-08-06 ENCOUNTER — Encounter (INDEPENDENT_AMBULATORY_CARE_PROVIDER_SITE_OTHER): Payer: Self-pay | Admitting: Family Medicine

## 2021-08-06 VITALS — BP 111/72 | HR 73 | Temp 98.1°F | Ht 62.0 in | Wt 273.0 lb

## 2021-08-06 DIAGNOSIS — E611 Iron deficiency: Secondary | ICD-10-CM | POA: Diagnosis not present

## 2021-08-06 DIAGNOSIS — E669 Obesity, unspecified: Secondary | ICD-10-CM

## 2021-08-06 DIAGNOSIS — K5909 Other constipation: Secondary | ICD-10-CM | POA: Diagnosis not present

## 2021-08-06 DIAGNOSIS — E559 Vitamin D deficiency, unspecified: Secondary | ICD-10-CM

## 2021-08-06 DIAGNOSIS — R7303 Prediabetes: Secondary | ICD-10-CM | POA: Diagnosis not present

## 2021-08-06 DIAGNOSIS — Z9189 Other specified personal risk factors, not elsewhere classified: Secondary | ICD-10-CM

## 2021-08-06 DIAGNOSIS — Z6841 Body Mass Index (BMI) 40.0 and over, adult: Secondary | ICD-10-CM

## 2021-08-06 MED ORDER — VITAMIN D (ERGOCALCIFEROL) 1.25 MG (50000 UNIT) PO CAPS: 50000.0000 [IU] | ORAL_CAPSULE | ORAL | 0 refills | Status: DC

## 2021-08-17 NOTE — Progress Notes (Signed)
? ? ? ?Chief Complaint:  ? ?OBESITY ?Donna Bond is here to discuss her progress with her obesity treatment plan along with follow-up of her obesity related diagnoses. Donna Bond is on the Category 3 Plan and states she is following her eating plan approximately 85% of the time. Donna Bond states she is walking for 20 minutes 4 times per week. ? ?Today's visit was #: 48 ?Starting weight: 325 lbs ?Starting date: 06/08/2020 ?Today's weight: 273 lbs ?Today's date: 08/06/2021 ?Total lbs lost to date: 31 ?Total lbs lost since last in-office visit: 6 ? ?Interim History: Donna Bond just came back from vacation in [place]. She was restrictive in her calories and she tried to make healthier choices, but also increased. She is here to review labs that were drawn at her last office visit.  ? ?Subjective:  ? ?1. Prediabetes ?Donna Bond's fasting insulin is improving and her A1c is improving at 5.6. we increased Mounjaro to 10 mg last office visit. She started that dose last week and she notes she "paid for it" when she ate too much ice cream. I discussed labs with the patient today. ? ?2. Iron deficiency ?Donna Bond is still taking iron 325 mg BID, without much constipation. She increased her fiber and water intake, and her symptoms resolved. Her Fe studies and CBC are stable. I discussed labs with the patient today. ? ?3. Vitamin D deficiency ?Donna Bond's Vitamin D level is above goal. Donna Bond is tolerating medication(s) well without side effects. Medication compliance is good as patient endorses taking it as prescribed. The patient denies additional concerns regarding this condition. I discussed labs with the patient today. ? ?4. Other constipation ?Donna Bond is using miralax, and her symptoms are under good control despite increased dose of Mounjaro and Fe supplement. ? ?5. At risk for dehydration ?Donna Bond is at risk for dehydration due to labs results and increasing activity.  ? ?Assessment/Plan:  ?No orders of the defined types were placed in this  encounter. ? ? ?Medications Discontinued During This Encounter  ?Medication Reason  ? Vitamin D, Ergocalciferol, (DRISDOL) 1.25 MG (50000 UNIT) CAPS capsule   ?  ? ?Meds ordered this encounter  ?Medications  ? DISCONTD: Vitamin D, Ergocalciferol, (DRISDOL) 1.25 MG (50000 UNIT) CAPS capsule  ?  Sig: Take 1 capsule (50,000 Units total) by mouth every 7 (seven) days.  ?  Dispense:  4 capsule  ?  Refill:  0  ?  Needs ov for RF  ?  ? ?1. Prediabetes ?Donna Bond will continue Mounjaro at 10 mg weekly for 4 weeks and then increase to 12.5 mg as tolerated. We will discussed at her next follow up office visit.  ? ?2. Iron deficiency ?Donna Bond is to make sure she takes her Fe supplementation BID. Her labs are stable, but still not at goal.  ? ?3. Vitamin D deficiency ?Donna Bond will continue with weight loss and Vitamin D supplement. She agreed to decrease prescription Vitamin to 50,000 IU once weekly. New script was given. ? ?- Vitamin D, Ergocalciferol, (DRISDOL) 1.25 MG (50000 UNIT) CAPS capsule; Take 1 capsule (50,000 Units total) by mouth every 7 (seven) days.  Dispense: 4 capsule; Refill: 0 ? ?4. Other constipation ?Donna Bond will continue miralax as needed, but increase her water intake to 100+ oz per day. She will increase her activity. CMP within normal limits. ? ?5. At risk for dehydration ?Donna Bond was given approximately 15 minutes of dehydration prevention counseling today. Donna Bond is at risk for dehydration due to weight loss and current medication(s). She was  encouraged to hydrate and monitor fluid status to avoid dehydration as weight loss plateaus. ? ?6. Obesity with current BMI of 50.0 ?Donna Bond is currently in the action stage of change. As such, her goal is to continue with weight loss efforts. She has agreed to the Category 3 Plan.  ? ?Donna Bond desires to increase her walking to 30-40 minutes 4-5 days per week. ? ?Exercise goals: As is. ? ?Behavioral modification strategies: increasing lean protein intake, decreasing simple carbohydrates, and  planning for success. ? ?Donna Bond has agreed to follow-up with our clinic in 3 to 4 weeks. She was informed of the importance of frequent follow-up visits to maximize her success with intensive lifestyle modifications for her multiple health conditions.  ? ?Objective:  ? ?Blood pressure 111/72, pulse 73, temperature 98.1 ?F (36.7 ?C), height 5\' 2"  (1.575 m), weight 273 lb (123.8 kg), SpO2 100 %. ?Body mass index is 49.93 kg/m?. ? ?General: Cooperative, alert, well developed, in no acute distress. ?HEENT: Conjunctivae and lids unremarkable. ?Cardiovascular: Regular rhythm.  ?Lungs: Normal work of breathing. ?Neurologic: No focal deficits.  ? ?Lab Results  ?Component Value Date  ? CREATININE 0.83 07/09/2021  ? BUN 20 07/09/2021  ? NA 141 07/09/2021  ? K 4.3 07/09/2021  ? CL 107 (H) 07/09/2021  ? CO2 23 07/09/2021  ? ?Lab Results  ?Component Value Date  ? ALT 28 07/09/2021  ? AST 23 07/09/2021  ? ALKPHOS 118 07/09/2021  ? BILITOT 0.3 07/09/2021  ? ?Lab Results  ?Component Value Date  ? HGBA1C 5.6 07/09/2021  ? HGBA1C 5.7 (H) 03/05/2021  ? HGBA1C 6.1 10/31/2020  ? HGBA1C 6.0 (H) 06/08/2020  ? HGBA1C 6.1 (H) 03/15/2020  ? ?Lab Results  ?Component Value Date  ? INSULIN 15.5 07/09/2021  ? INSULIN 18.2 06/08/2020  ? ?Lab Results  ?Component Value Date  ? TSH 1.180 07/09/2021  ? ?Lab Results  ?Component Value Date  ? CHOL 155 06/08/2020  ? HDL 43 06/08/2020  ? [place] 96 06/08/2020  ? TRIG 81 06/08/2020  ? CHOLHDL 3.6 06/08/2020  ? ?Lab Results  ?Component Value Date  ? VD25OH 80.6 07/09/2021  ? VD25OH 73.9 03/05/2021  ? VD25OH 34.90 10/31/2020  ? ?Lab Results  ?Component Value Date  ? WBC 11.8 (H) 07/09/2021  ? HGB 11.7 07/09/2021  ? HCT 38.8 07/09/2021  ? MCV 72 (L) 07/09/2021  ? PLT 312 07/09/2021  ? ?Lab Results  ?Component Value Date  ? IRON 40 07/09/2021  ? TIBC 332 07/09/2021  ? FERRITIN 135 07/09/2021  ? ?Attestation Statements:  ? ?Reviewed by clinician on day of visit: allergies, medications, problem list, medical  history, surgical history, family history, social history, and previous encounter notes. ? ? ?I, Trixie Dredge, am acting as transcriptionist for [employer], DO. ? ?I have reviewed the above documentation for accuracy and completeness, and I agree with the above. Marjory Sneddon, D.O. ? ?The [place] was signed into law in 2016 which includes the topic of electronic health records.  This provides immediate access to information in MyChart.  This includes consultation notes, operative notes, office notes, lab results and pathology reports.  If you have any questions about what you read please let us know at your next visit so we can discuss your concerns and take corrective action if need be.  We are right here with you. ? ? ?

## 2021-08-27 ENCOUNTER — Ambulatory Visit (INDEPENDENT_AMBULATORY_CARE_PROVIDER_SITE_OTHER): Payer: No Typology Code available for payment source | Admitting: Family Medicine

## 2021-08-27 ENCOUNTER — Other Ambulatory Visit (HOSPITAL_COMMUNITY): Payer: Self-pay

## 2021-08-27 ENCOUNTER — Encounter (INDEPENDENT_AMBULATORY_CARE_PROVIDER_SITE_OTHER): Payer: Self-pay | Admitting: Family Medicine

## 2021-08-27 VITALS — BP 106/69 | HR 80 | Temp 98.1°F | Ht 62.0 in | Wt 270.0 lb

## 2021-08-27 DIAGNOSIS — Z9189 Other specified personal risk factors, not elsewhere classified: Secondary | ICD-10-CM

## 2021-08-27 DIAGNOSIS — Z6841 Body Mass Index (BMI) 40.0 and over, adult: Secondary | ICD-10-CM

## 2021-08-27 DIAGNOSIS — E559 Vitamin D deficiency, unspecified: Secondary | ICD-10-CM | POA: Diagnosis not present

## 2021-08-27 DIAGNOSIS — E669 Obesity, unspecified: Secondary | ICD-10-CM

## 2021-08-27 DIAGNOSIS — F39 Unspecified mood [affective] disorder: Secondary | ICD-10-CM | POA: Diagnosis not present

## 2021-08-27 DIAGNOSIS — D508 Other iron deficiency anemias: Secondary | ICD-10-CM | POA: Diagnosis not present

## 2021-08-27 DIAGNOSIS — R7303 Prediabetes: Secondary | ICD-10-CM | POA: Diagnosis not present

## 2021-08-27 MED ORDER — VITAMIN D (ERGOCALCIFEROL) 1.25 MG (50000 UNIT) PO CAPS
50000.0000 [IU] | ORAL_CAPSULE | ORAL | 0 refills | Status: DC
  Filled 2021-08-27: qty 4, 28d supply, fill #0

## 2021-08-27 MED ORDER — BUPROPION HCL ER (SR) 150 MG PO TB12
150.0000 mg | ORAL_TABLET | Freq: Two times a day (BID) | ORAL | 0 refills | Status: DC
  Filled 2021-08-27: qty 60, 30d supply, fill #0

## 2021-08-31 NOTE — Progress Notes (Signed)
? ? ? ?Chief Complaint:  ? ?OBESITY ?Donna Bond is here to discuss her progress with her obesity treatment plan along with follow-up of her obesity related diagnoses. Donna Bond is on the Category 3 Plan and states she is following her eating plan approximately 85% of the time. Donna Bond states she is walking 30 minutes 5 times per week. ? ?Today's visit was #: 23 ?Starting weight: 325 lbs ?Starting date: 06/08/2020 ?Today's weight: 270 lbs ?Today's date: 08/27/2021 ?Total lbs lost to date: 73 ?Total lbs lost since last in-office visit: 3 ? ?Interim History: Donna Bond is doing well with her meal prep and meeting goals at dinner, as she is measuring proteins. Donna Bond is snacking on tuna creation packets and fruit. She is doing better with exercise. She denies cravings, hunger, or issues with the meal plan. ? ?Subjective:  ? ?1. Vitamin D deficiency ?We changed Kaiesha's vitamin D dose to 1 tablet weekly at her last visit. Donna Bond is tolerating medication(s) well without side effects.    ?  ?2. Prediabetes ?Donna Bond has done 2 doses of Mounjaro 12.5mg  and has had some nausea initially for 1 day and then she was fine. ? ?3. Other iron deficiency anemia ?Donna Bond is now taking 325mg  Fe SO4 BID. She has been regular with her intake and is tolerating it well. She denies constipation.  ? ?4. 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 She denies the need to increase the dose change. Her emotional eating is well controlled. ? ?5. At risk for constipation ?Donna Bond is at risk for constipation due to inadequate water intake and wile she is taking Iron tablets every day. ? ?Assessment/Plan:  ?No orders of the defined types were placed in this encounter. ? ? ?Medications Discontinued During This Encounter  ?Medication Reason  ? buPROPion (WELLBUTRIN SR) 150 MG 12 hr tablet Reorder  ? Vitamin D, Ergocalciferol, (DRISDOL) 1.25 MG (50000 UNIT) CAPS capsule Reorder  ?  ? ?Meds ordered this encounter  ?Medications  ? buPROPion  (WELLBUTRIN SR) 150 MG 12 hr tablet  ?  Sig: Take 1 tablet (150 mg total) by mouth 2 (two) times daily.  ?  Dispense:  60 tablet  ?  Refill:  0  ?  30 d supply; ov for rf  ? Vitamin D, Ergocalciferol, (DRISDOL) 1.25 MG (50000 UNIT) CAPS capsule  ?  Sig: Take 1 capsule (50,000 Units total) by mouth every 7 (seven) days.  ?  Dispense:  4 capsule  ?  Refill:  0  ?  Needs ov for RF  ?  ? ?1. Vitamin D deficiency ?Donna Bond agrees to continue taking prescription vitamin D and will follow up as directed, ? ?- Vitamin D, Ergocalciferol, (DRISDOL) 1.25 MG (50000 UNIT) CAPS capsule; Take 1 capsule (50,000 Units total) by mouth every 7 (seven) days.  Dispense: 4 capsule; Refill: 0 ? ?2. Prediabetes ?Donna Bond agrees to continue taking the [facility] 12.5mg  dose. Fadumo will continue to work on weight loss, exercise, and decreasing simple carbohydrates to help decrease the risk of diabetes.  ? ?3. Other iron deficiency anemia ?Donna Bond agrees to continue taking her Iron supplement. Orders and follow up as documented in patient record. We will recheck after 3 to 4 months of consistent use. ? ?Counseling ?Iron is essential for our bodies to make red blood cells.  Reasons that someone may be deficient include: an iron-deficient diet (more likely in those following vegan or vegetarian diets), women with heavy menses, patients with GI  disorders or poor absorption, patients that have had bariatric surgery, frequent blood donors, patients with cancer, and patients with heart disease.   ?An iron supplement has been recommended. This is found over-the-counter.  ?Iron-rich foods include dark leafy greens, red and white meats, eggs, seafood, and beans.   ?Certain foods and drinks prevent your body from absorbing iron properly. Avoid eating these foods in the same meal as iron-rich foods or with iron supplements. These foods include: coffee, black tea, and red wine; milk, dairy products, and foods that are high in calcium; beans and soybeans; whole grains.   ?Constipation can be a side effect of iron supplementation. Increased water and fiber intake are helpful. Water goal: > 2 liters/day. Fiber goal: > 25 grams/day.  ? ?4. 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 ? ?- buPROPion (WELLBUTRIN SR) 150 MG 12 hr tablet; Take 1 tablet (150 mg total) by mouth 2 (two) times daily.  Dispense: 60 tablet; Refill: 0 ? ?5. At risk for constipation ?Donna Bond was given approximately 12 minutes of counseling today regarding prevention of constipation. She was encouraged to increase water and fiber intake.   ? ?6. Obesity with current BMI of 49.4 ?Donna Bond is currently in the action stage of change. As such, her goal is to continue with weight loss efforts. She has agreed to the Category 3 Plan.  ? ?Exercise goals:  As is. ? ?Behavioral modification strategies: no skipping meals, proteins, or foods. ? ?Donna Bond has agreed to follow-up with our clinic in 4 weeks. She was informed of the importance of frequent follow-up visits to maximize her success with intensive lifestyle modifications for her multiple health conditions.  ? ?Objective:  ? ?Blood pressure 106/69, pulse 80, temperature 98.1 ?F (36.7 ?C), height 5\' 2"  (1.575 m), weight 270 lb (122.5 kg), SpO2 98 %. ?Body mass index is 49.38 kg/m?. ? ?General: Cooperative, alert, well developed, in no acute distress. ?HEENT: Conjunctivae and lids unremarkable. ?Cardiovascular: Regular rhythm.  ?Lungs: Normal work of breathing. ?Neurologic: No focal deficits.  ? ?Lab Results  ?Component Value Date  ? CREATININE 0.83 07/09/2021  ? BUN 20 07/09/2021  ? NA 141 07/09/2021  ? K 4.3 07/09/2021  ? CL 107 (H) 07/09/2021  ? CO2 23 07/09/2021  ? ?Lab Results  ?Component Value Date  ? ALT 28 07/09/2021  ? AST 23 07/09/2021  ? ALKPHOS 118 07/09/2021  ? BILITOT 0.3 07/09/2021  ? ?Lab Results  ?Component Value Date  ? HGBA1C 5.6 07/09/2021  ? HGBA1C 5.7 (H)  03/05/2021  ? HGBA1C 6.1 10/31/2020  ? HGBA1C 6.0 (H) 06/08/2020  ? HGBA1C 6.1 (H) 03/15/2020  ? ?Lab Results  ?Component Value Date  ? INSULIN 15.5 07/09/2021  ? INSULIN 18.2 06/08/2020  ? ?Lab Results  ?Component Value Date  ? TSH 1.180 07/09/2021  ? ?Lab Results  ?Component Value Date  ? CHOL 155 06/08/2020  ? HDL 43 06/08/2020  ? [place] 96 06/08/2020  ? TRIG 81 06/08/2020  ? CHOLHDL 3.6 06/08/2020  ? ?Lab Results  ?Component Value Date  ? VD25OH 80.6 07/09/2021  ? VD25OH 73.9 03/05/2021  ? VD25OH 34.90 10/31/2020  ? ?Lab Results  ?Component Value Date  ? WBC 11.8 (H) 07/09/2021  ? HGB 11.7 07/09/2021  ? HCT 38.8 07/09/2021  ? MCV 72 (L) 07/09/2021  ? PLT 312 07/09/2021  ? ?Lab Results  ?Component Value Date  ?  IRON 40 07/09/2021  ? TIBC 332 07/09/2021  ? FERRITIN 135 07/09/2021  ? ? ?Attestation Statements:  ? ?Reviewed by clinician on day of visit: allergies, medications, problem list, medical history, surgical history, family history, social history, and previous encounter notes. ? ?I, Marcille Blanco, CMA, am acting as transcriptionist for [employer], DO ? ?I have reviewed the above documentation for accuracy and completeness, and I agree with the above. Marjory Sneddon, D.O. ? ?The [place] was signed into law in 2016 which includes the topic of electronic health records.  This provides immediate access to information in MyChart.  This includes consultation notes, operative notes, office notes, lab results and pathology reports.  If you have any questions about what you read please let us know at your next visit so we can discuss your concerns and take corrective action if need be.  We are right here with you. ? ?

## 2021-09-04 ENCOUNTER — Other Ambulatory Visit (HOSPITAL_COMMUNITY): Payer: Self-pay

## 2021-09-18 ENCOUNTER — Other Ambulatory Visit (HOSPITAL_COMMUNITY): Payer: Self-pay

## 2021-09-18 ENCOUNTER — Ambulatory Visit (INDEPENDENT_AMBULATORY_CARE_PROVIDER_SITE_OTHER): Payer: No Typology Code available for payment source | Admitting: Family Medicine

## 2021-09-18 ENCOUNTER — Encounter (INDEPENDENT_AMBULATORY_CARE_PROVIDER_SITE_OTHER): Payer: Self-pay | Admitting: Family Medicine

## 2021-09-18 VITALS — BP 100/67 | HR 73 | Temp 97.7°F | Ht 62.0 in | Wt 269.0 lb

## 2021-09-18 DIAGNOSIS — R252 Cramp and spasm: Secondary | ICD-10-CM

## 2021-09-18 DIAGNOSIS — E559 Vitamin D deficiency, unspecified: Secondary | ICD-10-CM | POA: Diagnosis not present

## 2021-09-18 DIAGNOSIS — E669 Obesity, unspecified: Secondary | ICD-10-CM

## 2021-09-18 DIAGNOSIS — F3289 Other specified depressive episodes: Secondary | ICD-10-CM | POA: Diagnosis not present

## 2021-09-18 DIAGNOSIS — Z6841 Body Mass Index (BMI) 40.0 and over, adult: Secondary | ICD-10-CM

## 2021-09-18 MED ORDER — VITAMIN D (ERGOCALCIFEROL) 1.25 MG (50000 UNIT) PO CAPS
50000.0000 [IU] | ORAL_CAPSULE | ORAL | 0 refills | Status: DC
  Filled 2021-09-18 – 2021-09-27 (×2): qty 4, 28d supply, fill #0

## 2021-09-18 MED ORDER — BUPROPION HCL ER (SR) 150 MG PO TB12
150.0000 mg | ORAL_TABLET | Freq: Two times a day (BID) | ORAL | 0 refills | Status: DC
  Filled 2021-09-18 – 2021-09-27 (×2): qty 60, 30d supply, fill #0

## 2021-09-25 NOTE — Progress Notes (Signed)
Chief Complaint:   OBESITY Donna Bond is here to discuss her progress with her obesity treatment plan along with follow-up of her obesity related diagnoses. Donna Bond is on the Category 3 Plan and states she is following her eating plan approximately 80% of the time. Donna Bond states she is walking for 20 minutes 5 times per week.  Today's visit was #: 24 Starting weight: 325 lbs Starting date: 06/08/2020 Today's weight: 269 lbs Today's date: 09/18/2021 Total lbs lost to date: 36 Total lbs lost since last in-office visit: 1  Interim History: Donna Bond continues to do well with weight loss. She is doing well with meeting her protein goals and her hunger is reasonably well controlled. She has had extra temptations but she did well with portion control and smarter choices.   Subjective:   1. Vitamin D deficiency Donna Bond's last Vitamin D level was above goal at 59. She is on Vitamin D weekly prescription. She is at high risk of over-replacement.  2. Leg cramping Donna Bond notes occasional PM leg cramping, worse in the last month. Her electrolytes drawn 3 months ago was within normal limits.   3. Other depression, with emotional eating Donna Bond is doing well with decreasing emotional eating behaviors. She is able to make healthier choices and not feel deprived. Her blood pressure is well controlled.   Assessment/Plan:   1. Vitamin D deficiency We will refill prescription Vitamin D for 1 month, Donna Bond is to take it every other week. She will follow-up for routine testing of Vitamin D, at least 2-3 times per year to avoid over-replacement.  - Vitamin D, Ergocalciferol, (DRISDOL) 1.25 MG (50000 UNIT) CAPS capsule; Take 1 capsule (50,000 Units total) by mouth every 7 (seven) days.  Dispense: 4 capsule; Refill: 0  2. Leg cramping Donna Bond was encouraged to hydrate and she is ok to add a low calorie electrolyte powder/supplement once per day.  3. Other depression, with emotional eating We will refill Wellbutrin SR for 1 month.  Behavior modification techniques were discussed today to help Donna Bond deal with her emotional/non-hunger eating behaviors.  Orders and follow up as documented in patient record.   - buPROPion (WELLBUTRIN SR) 150 MG 12 hr tablet; Take 1 tablet (150 mg total) by mouth 2 (two) times daily.  Dispense: 60 tablet; Refill: 0  4. Obesity, Current BMI 49.3 Donna Bond is currently in the action stage of change. As such, her goal is to continue with weight loss efforts. She has agreed to the Category 3 Plan.   We will recheck fasting labs at her next visit.  Exercise goals: As is.  Behavioral modification strategies: increasing lean protein intake, increasing water intake, and meal planning and cooking strategies.  Donna Bond has agreed to follow-up with our clinic in 3 weeks. She was informed of the importance of frequent follow-up visits to maximize her success with intensive lifestyle modifications for her multiple health conditions.   Objective:   Blood pressure 100/67, pulse 73, temperature 97.7 F (36.5 C), height 5\' 2"  (1.575 m), weight 269 lb (122 kg), SpO2 100 %. Body mass index is 49.2 kg/m.  General: Cooperative, alert, well developed, in no acute distress. HEENT: Conjunctivae and lids unremarkable. Cardiovascular: Regular rhythm.  Lungs: Normal work of breathing. Neurologic: No focal deficits.   Lab Results  Component Value Date   CREATININE 0.83 07/09/2021   BUN 20 07/09/2021   NA 141 07/09/2021   K 4.3 07/09/2021   CL 107 (H) 07/09/2021   CO2 23 07/09/2021  Lab Results  Component Value Date   ALT 28 07/09/2021   AST 23 07/09/2021   ALKPHOS 118 07/09/2021   BILITOT 0.3 07/09/2021   Lab Results  Component Value Date   HGBA1C 5.6 07/09/2021   HGBA1C 5.7 (H) 03/05/2021   HGBA1C 6.1 10/31/2020   HGBA1C 6.0 (H) 06/08/2020   HGBA1C 6.1 (H) 03/15/2020   Lab Results  Component Value Date   INSULIN 15.5 07/09/2021   INSULIN 18.2 06/08/2020   Lab Results  Component Value Date   TSH  1.180 07/09/2021   Lab Results  Component Value Date   CHOL 155 06/08/2020   HDL 43 06/08/2020   LDLCALC 96 06/08/2020   TRIG 81 06/08/2020   CHOLHDL 3.6 06/08/2020   Lab Results  Component Value Date   VD25OH 80.6 07/09/2021   VD25OH 73.9 03/05/2021   VD25OH 34.90 10/31/2020   Lab Results  Component Value Date   WBC 11.8 (H) 07/09/2021   HGB 11.7 07/09/2021   HCT 38.8 07/09/2021   MCV 72 (L) 07/09/2021   PLT 312 07/09/2021   Lab Results  Component Value Date   IRON 40 07/09/2021   TIBC 332 07/09/2021   FERRITIN 135 07/09/2021   Attestation Statements:   Reviewed by clinician on day of visit: allergies, medications, problem list, medical history, surgical history, family history, social history, and previous encounter notes.   I, Trixie Dredge, am acting as transcriptionist for Dennard Nip, MD.  I have reviewed the above documentation for accuracy and completeness, and I agree with the above. -  Dennard Nip, MD

## 2021-09-26 ENCOUNTER — Other Ambulatory Visit (HOSPITAL_COMMUNITY): Payer: Self-pay

## 2021-09-27 ENCOUNTER — Other Ambulatory Visit (HOSPITAL_COMMUNITY): Payer: Self-pay

## 2021-10-09 ENCOUNTER — Ambulatory Visit (INDEPENDENT_AMBULATORY_CARE_PROVIDER_SITE_OTHER): Payer: No Typology Code available for payment source | Admitting: Family Medicine

## 2021-10-09 ENCOUNTER — Encounter (INDEPENDENT_AMBULATORY_CARE_PROVIDER_SITE_OTHER): Payer: Self-pay | Admitting: Family Medicine

## 2021-10-09 ENCOUNTER — Other Ambulatory Visit (HOSPITAL_COMMUNITY): Payer: Self-pay

## 2021-10-09 VITALS — BP 110/70 | HR 77 | Temp 98.6°F | Ht 62.0 in | Wt 270.0 lb

## 2021-10-09 DIAGNOSIS — Z9189 Other specified personal risk factors, not elsewhere classified: Secondary | ICD-10-CM

## 2021-10-09 DIAGNOSIS — D508 Other iron deficiency anemias: Secondary | ICD-10-CM

## 2021-10-09 DIAGNOSIS — R252 Cramp and spasm: Secondary | ICD-10-CM | POA: Insufficient documentation

## 2021-10-09 DIAGNOSIS — E786 Lipoprotein deficiency: Secondary | ICD-10-CM

## 2021-10-09 DIAGNOSIS — Z6841 Body Mass Index (BMI) 40.0 and over, adult: Secondary | ICD-10-CM

## 2021-10-09 DIAGNOSIS — R7303 Prediabetes: Secondary | ICD-10-CM | POA: Diagnosis not present

## 2021-10-09 DIAGNOSIS — E559 Vitamin D deficiency, unspecified: Secondary | ICD-10-CM

## 2021-10-09 DIAGNOSIS — E669 Obesity, unspecified: Secondary | ICD-10-CM

## 2021-10-09 MED ORDER — VITAMIN D (ERGOCALCIFEROL) 1.25 MG (50000 UNIT) PO CAPS
50000.0000 [IU] | ORAL_CAPSULE | ORAL | 0 refills | Status: DC
  Filled 2021-10-09: qty 6, 84d supply, fill #0

## 2021-10-10 LAB — CBC WITH DIFFERENTIAL/PLATELET
Basophils Absolute: 0.1 10*3/uL (ref 0.0–0.2)
Basos: 1 %
EOS (ABSOLUTE): 0.1 10*3/uL (ref 0.0–0.4)
Eos: 1 %
Hematocrit: 37.9 % (ref 34.0–46.6)
Hemoglobin: 11.6 g/dL (ref 11.1–15.9)
Immature Grans (Abs): 0.1 10*3/uL (ref 0.0–0.1)
Immature Granulocytes: 1 %
Lymphocytes Absolute: 2.7 10*3/uL (ref 0.7–3.1)
Lymphs: 28 %
MCH: 22 pg — ABNORMAL LOW (ref 26.6–33.0)
MCHC: 30.6 g/dL — ABNORMAL LOW (ref 31.5–35.7)
MCV: 72 fL — ABNORMAL LOW (ref 79–97)
Monocytes Absolute: 0.5 10*3/uL (ref 0.1–0.9)
Monocytes: 5 %
Neutrophils Absolute: 6.2 10*3/uL (ref 1.4–7.0)
Neutrophils: 64 %
Platelets: 332 10*3/uL (ref 150–450)
RBC: 5.28 x10E6/uL (ref 3.77–5.28)
RDW: 16.5 % — ABNORMAL HIGH (ref 11.7–15.4)
WBC: 9.5 10*3/uL (ref 3.4–10.8)

## 2021-10-10 LAB — IRON AND TIBC
Iron Saturation: 11 % — ABNORMAL LOW (ref 15–55)
Iron: 36 ug/dL (ref 27–159)
Total Iron Binding Capacity: 325 ug/dL (ref 250–450)
UIBC: 289 ug/dL (ref 131–425)

## 2021-10-10 LAB — FOLATE: Folate: 13 ng/mL (ref >3.0)

## 2021-10-10 LAB — COMPREHENSIVE METABOLIC PANEL
ALT: 16 IU/L (ref 0–32)
AST: 15 IU/L (ref 0–40)
Albumin/Globulin Ratio: 1.2 (ref 1.2–2.2)
Albumin: 3.6 g/dL — ABNORMAL LOW (ref 3.8–4.8)
Alkaline Phosphatase: 98 IU/L (ref 44–121)
BUN/Creatinine Ratio: 13 (ref 9–23)
BUN: 11 mg/dL (ref 6–24)
Bilirubin Total: 0.4 mg/dL (ref 0.0–1.2)
CO2: 20 mmol/L (ref 20–29)
Calcium: 8.3 mg/dL — ABNORMAL LOW (ref 8.7–10.2)
Chloride: 105 mmol/L (ref 96–106)
Creatinine, Ser: 0.84 mg/dL (ref 0.57–1.00)
Globulin, Total: 2.9 g/dL (ref 1.5–4.5)
Glucose: 78 mg/dL (ref 70–99)
Potassium: 4.2 mmol/L (ref 3.5–5.2)
Sodium: 138 mmol/L (ref 134–144)
Total Protein: 6.5 g/dL (ref 6.0–8.5)
eGFR: 90 mL/min/{1.73_m2} (ref >59)

## 2021-10-10 LAB — MAGNESIUM: Magnesium: 2.2 mg/dL (ref 1.6–2.3)

## 2021-10-10 LAB — LIPID PANEL
Chol/HDL Ratio: 2.8 ratio (ref 0.0–4.4)
Cholesterol, Total: 111 mg/dL (ref 100–199)
HDL: 40 mg/dL (ref >39)
LDL Chol Calc (NIH): 59 mg/dL (ref 0–99)
Triglycerides: 48 mg/dL (ref 0–149)
VLDL Cholesterol Cal: 12 mg/dL (ref 5–40)

## 2021-10-10 LAB — VITAMIN B12: Vitamin B-12: 790 pg/mL (ref 232–1245)

## 2021-10-10 LAB — PHOSPHORUS: Phosphorus: 3.3 mg/dL (ref 3.0–4.3)

## 2021-10-10 LAB — INSULIN, RANDOM: INSULIN: 9.7 u[IU]/mL (ref 2.6–24.9)

## 2021-10-10 LAB — HEMOGLOBIN A1C
Est. average glucose Bld gHb Est-mCnc: 108 mg/dL
Hgb A1c MFr Bld: 5.4 % (ref 4.8–5.6)

## 2021-10-10 LAB — FERRITIN: Ferritin: 145 ng/mL (ref 15–150)

## 2021-10-10 LAB — TRANSFERRIN: Transferrin: 254 mg/dL (ref 192–364)

## 2021-10-10 LAB — VITAMIN D 25 HYDROXY (VIT D DEFICIENCY, FRACTURES): Vit D, 25-Hydroxy: 43.4 ng/mL (ref 30.0–100.0)

## 2021-10-31 ENCOUNTER — Ambulatory Visit (INDEPENDENT_AMBULATORY_CARE_PROVIDER_SITE_OTHER): Payer: No Typology Code available for payment source | Admitting: Family Medicine

## 2021-11-21 ENCOUNTER — Ambulatory Visit (INDEPENDENT_AMBULATORY_CARE_PROVIDER_SITE_OTHER): Payer: No Typology Code available for payment source | Admitting: Family Medicine

## 2021-11-21 ENCOUNTER — Other Ambulatory Visit (HOSPITAL_COMMUNITY): Payer: Self-pay

## 2021-11-21 ENCOUNTER — Encounter (INDEPENDENT_AMBULATORY_CARE_PROVIDER_SITE_OTHER): Payer: Self-pay | Admitting: Family Medicine

## 2021-11-21 VITALS — BP 120/80 | HR 71 | Temp 98.3°F | Ht 62.0 in | Wt 266.0 lb

## 2021-11-21 DIAGNOSIS — R7303 Prediabetes: Secondary | ICD-10-CM | POA: Diagnosis not present

## 2021-11-21 DIAGNOSIS — E559 Vitamin D deficiency, unspecified: Secondary | ICD-10-CM | POA: Diagnosis not present

## 2021-11-21 DIAGNOSIS — R632 Polyphagia: Secondary | ICD-10-CM | POA: Diagnosis not present

## 2021-11-21 DIAGNOSIS — F3289 Other specified depressive episodes: Secondary | ICD-10-CM

## 2021-11-21 DIAGNOSIS — Z6841 Body Mass Index (BMI) 40.0 and over, adult: Secondary | ICD-10-CM

## 2021-11-21 DIAGNOSIS — E669 Obesity, unspecified: Secondary | ICD-10-CM

## 2021-11-21 DIAGNOSIS — Z7985 Long-term (current) use of injectable non-insulin antidiabetic drugs: Secondary | ICD-10-CM

## 2021-11-21 MED ORDER — VITAMIN D (ERGOCALCIFEROL) 1.25 MG (50000 UNIT) PO CAPS
50000.0000 [IU] | ORAL_CAPSULE | ORAL | 0 refills | Status: DC
  Filled 2021-11-21: qty 4, 28d supply, fill #0

## 2021-11-21 MED ORDER — SEMAGLUTIDE-WEIGHT MANAGEMENT 1.7 MG/0.75ML ~~LOC~~ SOAJ
1.7000 mg | SUBCUTANEOUS | 0 refills | Status: DC
  Filled 2021-11-21 – 2021-11-22 (×2): qty 3, 28d supply, fill #0

## 2021-11-21 MED ORDER — BUPROPION HCL ER (SR) 150 MG PO TB12
150.0000 mg | ORAL_TABLET | Freq: Two times a day (BID) | ORAL | 0 refills | Status: DC
  Filled 2021-11-21: qty 60, 30d supply, fill #0

## 2021-11-22 ENCOUNTER — Other Ambulatory Visit (HOSPITAL_COMMUNITY): Payer: Self-pay

## 2021-11-22 ENCOUNTER — Encounter (INDEPENDENT_AMBULATORY_CARE_PROVIDER_SITE_OTHER): Payer: Self-pay

## 2021-11-22 ENCOUNTER — Telehealth (INDEPENDENT_AMBULATORY_CARE_PROVIDER_SITE_OTHER): Payer: Self-pay | Admitting: Family Medicine

## 2021-11-22 NOTE — Telephone Encounter (Signed)
Dr. Sharee Holster - Prior authorization approved for [id number]. Effective: 11/22/2021 to 06/13/2022. Patient sent approval message via mychart.

## 2021-11-25 NOTE — Progress Notes (Signed)
Chief Complaint:   OBESITY Donna Bond is here to discuss her progress with her obesity treatment plan along with follow-up of her obesity related diagnoses. Donna Bond is on the Category 3 Plan with breakfast and lunch options and states she is following her eating plan approximately 30% of the time. Donna Bond states she is walking 30 minutes 3-4 times per week.  Today's visit was #: 26 Starting weight: 325 lbs Starting date: 06/08/2020 Today's weight: 266 lbs Today's date: 11/21/2021 Total lbs lost to date: 59 Total lbs lost since last in-office visit: 4  Interim History: Donna Bond reports it's been a challenging few weeks with July 4th and several stressors in life. She took her last shot of Mounjaro this week but insurance does not cover it anymore.   Subjective:   1. Vitamin D deficiency Discussed labs with patient today. Donna Bond has not been consistent with Vit D use the past couple of weeks. She was taking it once weekly prior, instead of every 14 days, as written on script.  2. Prediabetes Discussed labs with patient today. Donna Bond has a diagnosis of prediabetes based on her elevated HgA1c and was informed this puts her at greater risk of developing diabetes. She continues to work on diet and exercise to decrease her risk of diabetes. She denies nausea or hypoglycemia.  3. Other depression, with emotional eating Discussed labs with patient today. Pt reports more emotional eating lately due to stressors. She is not always taking the second dose of Wellbutrin. Her brother was arrested and her uncle passed away recently. Pt denies depression currently.  4. Polyphagia Discussed labs with patient today. Donna Bond endorses excessive hunger.   Assessment/Plan:  No orders of the defined types were placed in this encounter.   Medications Discontinued During This Encounter  Medication Reason   tirzepatide (MOUNJARO) 12.5 MG/0.5ML Pen    buPROPion (WELLBUTRIN SR) 150 MG 12 hr tablet Reorder   Vitamin D,  Ergocalciferol, (DRISDOL) 1.25 MG (50000 UNIT) CAPS capsule Reorder     Meds ordered this encounter  Medications   buPROPion (WELLBUTRIN SR) 150 MG 12 hr tablet    Sig: Take 1 tablet (150 mg total) by mouth 2 (two) times daily.    Dispense:  60 tablet    Refill:  0    30 d supply; ov for rf   Semaglutide-Weight Management 1.7 MG/0.75ML SOAJ    Sig: Inject 1.7 mg into the skin once a week.    Dispense:  3 mL    Refill:  0   Vitamin D, Ergocalciferol, (DRISDOL) 1.25 MG (50000 UNIT) CAPS capsule    Sig: Take 1 capsule (50,000 Units total) by mouth every 7 (seven) days.    Dispense:  4 capsule    Refill:  0    Needs ov for RF; 90 d supply     1. Vitamin D deficiency Low Vitamin D level contributes to fatigue and are associated with obesity, breast, and colon cancer. She agrees to restart prescription Vitamin D @50 ,000 IU every week and will follow-up for routine testing of Vitamin D, at least 2-3 times per year to avoid over-replacement.  Restart & Refill- Vitamin D, Ergocalciferol, (DRISDOL) 1.25 MG (50000 UNIT) CAPS capsule; Take 1 capsule (50,000 Units total) by mouth every 7 (seven) days.  Dispense: 4 capsule; Refill: 0  2. Prediabetes A1c is now down to 5.4 and is the best it has been. Donna Bond will continue to work on weight loss, exercise, and decreasing simple carbohydrates to help  decrease the risk of diabetes.   3. Other depression, with emotional eating Behavior modification techniques were discussed today to help Donna Bond deal with her emotional/non-hunger eating behaviors.  Orders and follow up as documented in patient record. Continue Wellbutrin, but be sure to take it twice a day.  Refill- buPROPion (WELLBUTRIN SR) 150 MG 12 hr tablet; Take 1 tablet (150 mg total) by mouth 2 (two) times daily.  Dispense: 60 tablet; Refill: 0  4. Polyphagia Intensive lifestyle modifications are the first line treatment for this issue. We discussed several lifestyle modifications today and she will  continue to work on diet, exercise and weight loss efforts. Orders and follow up as documented in patient record. Discontinue Mounjaro , as it is no longer covered. Start Wegovy at 1.7 mg weekly.  Counseling Polyphagia is excessive hunger. Causes can include: low blood sugars, hypERthyroidism, PMS, lack of sleep, stress, insulin resistance, diabetes, certain medications, and diets that are deficient in protein and fiber.   Start- Semaglutide-Weight Management 1.7 MG/0.75ML SOAJ; Inject 1.7 mg into the skin once a week.  Dispense: 3 mL; Refill: 0  5. Obesity, current BMI 48.8 Donna Bond is currently in the action stage of change. As such, her goal is to continue with weight loss efforts. She has agreed to the Category 3 Plan with breakfast and lunch options.   Work on breathing techniques to help with stress. Go to the gym 1 or more days week.  Exercise goals:  As is  Behavioral modification strategies: increasing lean protein intake, decreasing simple carbohydrates, avoiding temptations, and planning for success.  Donna Bond has agreed to follow-up with our clinic in 3-4 weeks. She was informed of the importance of frequent follow-up visits to maximize her success with intensive lifestyle modifications for her multiple health conditions.   Objective:   Blood pressure 120/80, pulse 71, temperature 98.3 F (36.8 C), height 5\' 2"  (1.575 m), weight 266 lb (120.7 kg), SpO2 100 %. Body mass index is 48.65 kg/m.  General: Cooperative, alert, well developed, in no acute distress. HEENT: Conjunctivae and lids unremarkable. Cardiovascular: Regular rhythm.  Lungs: Normal work of breathing. Neurologic: No focal deficits.   Lab Results  Component Value Date   CREATININE 0.84 10/09/2021   BUN 11 10/09/2021   NA 138 10/09/2021   K 4.2 10/09/2021   CL 105 10/09/2021   CO2 20 10/09/2021   Lab Results  Component Value Date   ALT 16 10/09/2021   AST 15 10/09/2021   ALKPHOS 98 10/09/2021   BILITOT 0.4  10/09/2021   Lab Results  Component Value Date   HGBA1C 5.4 10/09/2021   HGBA1C 5.6 07/09/2021   HGBA1C 5.7 (H) 03/05/2021   HGBA1C 6.1 10/31/2020   HGBA1C 6.0 (H) 06/08/2020   Lab Results  Component Value Date   INSULIN 9.7 10/09/2021   INSULIN 15.5 07/09/2021   INSULIN 18.2 06/08/2020   Lab Results  Component Value Date   TSH 1.180 07/09/2021   Lab Results  Component Value Date   CHOL 111 10/09/2021   HDL 40 10/09/2021   LDLCALC 59 10/09/2021   TRIG 48 10/09/2021   CHOLHDL 2.8 10/09/2021   Lab Results  Component Value Date   VD25OH 43.4 10/09/2021   VD25OH 80.6 07/09/2021   VD25OH 73.9 03/05/2021   Lab Results  Component Value Date   WBC 9.5 10/09/2021   HGB 11.6 10/09/2021   HCT 37.9 10/09/2021   MCV 72 (L) 10/09/2021   PLT 332 10/09/2021   Lab Results  Component Value Date   IRON 36 10/09/2021   TIBC 325 10/09/2021   FERRITIN 145 10/09/2021   Attestation Statements:   Reviewed by clinician on day of visit: allergies, medications, problem list, medical history, surgical history, family history, social history, and previous encounter notes.  Time spent on visit including pre-visit chart review and post-visit care and charting was 40 minutes.   I, Kyung Rudd, BS, CMA, am acting as transcriptionist for [employer], DO.   I have reviewed the above documentation for accuracy and completeness, and I agree with the above. [name], D.O.  The 21st Century C[employer] Act was signed into law in 2016 which includes the topic of electronic health records.  This provides immediate access to information in MyChart.  This includes consultation notes, operative notes, office notes, lab results and pathology reports.  If you have any questions about what you read please let us know at your next visit so we can discuss your concerns and take corrective action if need be.  We are right here with you.

## 2021-11-28 ENCOUNTER — Encounter (INDEPENDENT_AMBULATORY_CARE_PROVIDER_SITE_OTHER): Payer: Self-pay

## 2021-12-12 ENCOUNTER — Ambulatory Visit (INDEPENDENT_AMBULATORY_CARE_PROVIDER_SITE_OTHER): Payer: No Typology Code available for payment source | Admitting: Family Medicine

## 2021-12-12 ENCOUNTER — Other Ambulatory Visit (HOSPITAL_COMMUNITY): Payer: Self-pay

## 2021-12-12 ENCOUNTER — Encounter (INDEPENDENT_AMBULATORY_CARE_PROVIDER_SITE_OTHER): Payer: Self-pay | Admitting: Family Medicine

## 2021-12-12 VITALS — BP 116/82 | HR 67 | Temp 98.7°F | Ht 62.0 in | Wt 269.0 lb

## 2021-12-12 DIAGNOSIS — D508 Other iron deficiency anemias: Secondary | ICD-10-CM | POA: Diagnosis not present

## 2021-12-12 DIAGNOSIS — E669 Obesity, unspecified: Secondary | ICD-10-CM | POA: Diagnosis not present

## 2021-12-12 DIAGNOSIS — Z6841 Body Mass Index (BMI) 40.0 and over, adult: Secondary | ICD-10-CM | POA: Diagnosis not present

## 2021-12-12 DIAGNOSIS — E559 Vitamin D deficiency, unspecified: Secondary | ICD-10-CM

## 2021-12-12 DIAGNOSIS — Z7985 Long-term (current) use of injectable non-insulin antidiabetic drugs: Secondary | ICD-10-CM

## 2021-12-12 MED ORDER — SEMAGLUTIDE-WEIGHT MANAGEMENT 1.7 MG/0.75ML ~~LOC~~ SOAJ
1.7000 mg | SUBCUTANEOUS | 0 refills | Status: DC
  Filled 2021-12-12: qty 3, 28d supply, fill #0

## 2021-12-12 MED ORDER — VITAMIN D (ERGOCALCIFEROL) 1.25 MG (50000 UNIT) PO CAPS
50000.0000 [IU] | ORAL_CAPSULE | ORAL | 0 refills | Status: DC
  Filled 2021-12-12: qty 4, 28d supply, fill #0

## 2021-12-14 ENCOUNTER — Other Ambulatory Visit (HOSPITAL_COMMUNITY): Payer: Self-pay

## 2021-12-18 NOTE — Progress Notes (Signed)
Chief Complaint:   OBESITY Donna Bond is here to discuss her progress with her obesity treatment plan along with follow-up of her obesity related diagnoses. Donna Bond is on the Category 3 Plan with breakfast and lunch options and states she is following her eating plan approximately 85% of the time. Donna Bond states she is walking 20-25 minutes 3-4 times per week.  Today's visit was #: 27 Starting weight: 325 lbs Starting date: 06/08/2020 Today's weight: 269 lbs Today's date: 12/12/2021 Total lbs lost to date: 56 Total lbs lost since last in-office visit: +3  Interim History: Donna Bond is having some financial burdens (had to buy a new fridge) and hasn't bought food yet. She is still on [facility] and if she is not eating, she feels sick.  Subjective:   1. Other iron deficiency anemia Pt is taking her ferrous sulfate daily now. She gets nauseous if she doesn't eat before taking it but denies constipation.   2. Vitamin D deficiency She is currently taking prescription vitamin D 50,000 IU each week. She denies nausea, vomiting or muscle weakness.  3. Obesity, current BMI 49.2 Donna Bond is not eating her foods due to financial hardship and food insecurity issues.  Assessment/Plan:  No orders of the defined types were placed in this encounter.   Medications Discontinued During This Encounter  Medication Reason   Semaglutide-Weight Management 1.7 MG/0.75ML SOAJ Reorder   Vitamin D, Ergocalciferol, (DRISDOL) 1.25 MG (50000 UNIT) CAPS capsule Reorder     Meds ordered this encounter  Medications   Semaglutide-Weight Management 1.7 MG/0.75ML SOAJ    Sig: Inject 1.7 mg into the skin once a week.    Dispense:  3 mL    Refill:  0   Vitamin D, Ergocalciferol, (DRISDOL) 1.25 MG (50000 UNIT) CAPS capsule    Sig: Take 1 capsule (50,000 Units total) by mouth every 7 (seven) days.    Dispense:  4 capsule    Refill:  0    Needs ov for RF; 90 d supply     1. Other iron deficiency anemia Labs reviewed and done on  the medication.  Plan: An OTC iron supplement has been recommended of 325 mg 1-2 times daily with food.  - Iron-rich foods include dark leafy greens, red and white meats, eggs, seafood, and beans   - Vit C can help enhance the absorption of iron - Certain foods and drinks prevent your body from absorbing iron properly.  Avoid eating these foods in the same meal as iron-rich foods or with iron supplements.  These foods include: coffee, black tea, and red wine; milk, dairy products, and foods that are high in calcium; beans and soybeans; whole grains.  - Educated pt that constipation can be a side effect of iron supplementation.  Increased water and fiber intake are helpful.  Water goal: 1/2 of pt's weight in ounces of water per day unless told otherwise by a healthcare provider.  Fiber goal: > 25 grams/day.   2. Vitamin D deficiency Low Vitamin D level contributes to fatigue and are associated with obesity, breast, and colon cancer. She agrees to continue to take prescription Vitamin D @50 ,000 IU every week and will follow-up for routine testing of Vitamin D, at least 2-3 times per year to avoid over-replacement.  Refill- Vitamin D, Ergocalciferol, (DRISDOL) 1.25 MG (50000 UNIT) CAPS capsule; Take 1 capsule (50,000 Units total) by mouth every 7 (seven) days.  Dispense: 4 capsule; Refill: 0  3. Obesity, current BMI 49.2 Donna Bond is currently  in the action stage of change. As such, her goal is to continue with weight loss efforts. She has agreed to the Category 3 Plan with breakfast and lunch options. Use food pantries.  Food assistance programs discussed with pt and handouts given. Pt will continue to use coupon for [employer]- free for 12 months. Refill- Semaglutide-Weight Management 1.7 MG/0.75ML SOAJ; Inject 1.7 mg into the skin once a week.  Dispense: 3 mL; Refill: 0  Exercise goals:  Increase walking to 5 days a week.  Behavioral modification strategies: increasing lean protein intake and avoiding  temptations.  Donna Bond has agreed to follow-up with our clinic in 4 weeks. She was informed of the importance of frequent follow-up visits to maximize her success with intensive lifestyle modifications for her multiple health conditions.   Objective:   Blood pressure 116/82, pulse 67, temperature 98.7 F (37.1 C), height 5\' 2"  (1.575 m), weight 269 lb (122 kg), SpO2 98 %. Body mass index is 49.2 kg/m.  General: Cooperative, alert, well developed, in no acute distress. HEENT: Conjunctivae and lids unremarkable. Cardiovascular: Regular rhythm.  Lungs: Normal work of breathing. Neurologic: No focal deficits.   Lab Results  Component Value Date   CREATININE 0.84 10/09/2021   BUN 11 10/09/2021   NA 138 10/09/2021   K 4.2 10/09/2021   CL 105 10/09/2021   CO2 20 10/09/2021   Lab Results  Component Value Date   ALT 16 10/09/2021   AST 15 10/09/2021   ALKPHOS 98 10/09/2021   BILITOT 0.4 10/09/2021   Lab Results  Component Value Date   HGBA1C 5.4 10/09/2021   HGBA1C 5.6 07/09/2021   HGBA1C 5.7 (H) 03/05/2021   HGBA1C 6.1 10/31/2020   HGBA1C 6.0 (H) 06/08/2020   Lab Results  Component Value Date   INSULIN 9.7 10/09/2021   INSULIN 15.5 07/09/2021   INSULIN 18.2 06/08/2020   Lab Results  Component Value Date   TSH 1.180 07/09/2021   Lab Results  Component Value Date   CHOL 111 10/09/2021   HDL 40 10/09/2021   LDLCALC 59 10/09/2021   TRIG 48 10/09/2021   CHOLHDL 2.8 10/09/2021   Lab Results  Component Value Date   VD25OH 43.4 10/09/2021   VD25OH 80.6 07/09/2021   VD25OH 73.9 03/05/2021   Lab Results  Component Value Date   WBC 9.5 10/09/2021   HGB 11.6 10/09/2021   HCT 37.9 10/09/2021   MCV 72 (L) 10/09/2021   PLT 332 10/09/2021   Lab Results  Component Value Date   IRON 36 10/09/2021   TIBC 325 10/09/2021   FERRITIN 145 10/09/2021   Attestation Statements:   Reviewed by clinician on day of visit: allergies, medications, problem list, medical history,  surgical history, family history, social history, and previous encounter notes.  I, 10/11/2021, BS, CMA, am acting as transcriptionist for Kyung Rudd, DO.   I have reviewed the above documentation for accuracy and completeness, and I agree with the above. [employer], D.O.  The 21st Century Cures Act was signed into law in 2016 which includes the topic of electronic health records.  This provides immediate access to information in MyChart.  This includes consultation notes, operative notes, office notes, lab results and pathology reports.  If you have any questions about what you read please let 2017 know at your next visit so we can discuss your concerns and take corrective action if need be.  We are right here with you.

## 2021-12-19 ENCOUNTER — Encounter: Payer: Self-pay | Admitting: Internal Medicine

## 2021-12-31 ENCOUNTER — Ambulatory Visit (INDEPENDENT_AMBULATORY_CARE_PROVIDER_SITE_OTHER): Payer: No Typology Code available for payment source

## 2021-12-31 ENCOUNTER — Ambulatory Visit
Admission: RE | Admit: 2021-12-31 | Discharge: 2021-12-31 | Disposition: A | Discharge status: Other Acute Inpatient Hospital | Payer: No Typology Code available for payment source | Source: Ambulatory Visit | Attending: Emergency Medicine | Admitting: Emergency Medicine

## 2021-12-31 ENCOUNTER — Encounter (HOSPITAL_BASED_OUTPATIENT_CLINIC_OR_DEPARTMENT_OTHER): Payer: Self-pay | Admitting: Emergency Medicine

## 2021-12-31 ENCOUNTER — Other Ambulatory Visit: Payer: Self-pay

## 2021-12-31 ENCOUNTER — Emergency Department (HOSPITAL_BASED_OUTPATIENT_CLINIC_OR_DEPARTMENT_OTHER): Payer: No Typology Code available for payment source

## 2021-12-31 ENCOUNTER — Emergency Department (HOSPITAL_BASED_OUTPATIENT_CLINIC_OR_DEPARTMENT_OTHER)
Admission: EM | Admit: 2021-12-31 | Discharge: 2022-01-01 | Disposition: A | Discharge status: Home / Self Care | Payer: No Typology Code available for payment source | Attending: Emergency Medicine | Admitting: Emergency Medicine

## 2021-12-31 VITALS — BP 113/86 | HR 90 | Temp 98.6°F | Resp 24

## 2021-12-31 DIAGNOSIS — U099 Post covid-19 condition, unspecified: Secondary | ICD-10-CM | POA: Diagnosis not present

## 2021-12-31 DIAGNOSIS — R0789 Other chest pain: Secondary | ICD-10-CM | POA: Diagnosis not present

## 2021-12-31 DIAGNOSIS — R0602 Shortness of breath: Secondary | ICD-10-CM | POA: Diagnosis present

## 2021-12-31 DIAGNOSIS — R0600 Dyspnea, unspecified: Secondary | ICD-10-CM | POA: Insufficient documentation

## 2021-12-31 LAB — CBC
HCT: 41.1 % (ref 36.0–46.0)
Hemoglobin: 12.9 g/dL (ref 12.0–15.0)
MCH: 21.9 pg — ABNORMAL LOW (ref 26.0–34.0)
MCHC: 31.4 g/dL (ref 30.0–36.0)
MCV: 69.8 fL — ABNORMAL LOW (ref 80.0–100.0)
Platelets: 347 10*3/uL (ref 150–400)
RBC: 5.89 MIL/uL — ABNORMAL HIGH (ref 3.87–5.11)
RDW: 15.9 % — ABNORMAL HIGH (ref 11.5–15.5)
WBC: 13.8 10*3/uL — ABNORMAL HIGH (ref 4.0–10.5)
nRBC: 0 % (ref 0.0–0.2)

## 2021-12-31 LAB — COMPREHENSIVE METABOLIC PANEL
ALT: 25 U/L (ref 0–44)
AST: 21 U/L (ref 15–41)
Albumin: 3.7 g/dL (ref 3.5–5.0)
Alkaline Phosphatase: 93 U/L (ref 38–126)
Anion gap: 7 (ref 5–15)
BUN: 13 mg/dL (ref 6–20)
CO2: 26 mmol/L (ref 22–32)
Calcium: 8.5 mg/dL — ABNORMAL LOW (ref 8.9–10.3)
Chloride: 105 mmol/L (ref 98–111)
Creatinine, Ser: 0.96 mg/dL (ref 0.44–1.00)
GFR, Estimated: >60 mL/min (ref >60)
Glucose, Bld: 97 mg/dL (ref 70–99)
Potassium: 3.8 mmol/L (ref 3.5–5.1)
Sodium: 138 mmol/L (ref 135–145)
Total Bilirubin: 0.3 mg/dL (ref 0.3–1.2)
Total Protein: 8.3 g/dL — ABNORMAL HIGH (ref 6.5–8.1)

## 2021-12-31 LAB — TROPONIN I (HIGH SENSITIVITY): Troponin I (High Sensitivity): <2 ng/L (ref <18)

## 2021-12-31 MED ORDER — IPRATROPIUM-ALBUTEROL 0.5-2.5 (3) MG/3ML IN SOLN
3.0000 mL | Freq: Once | RESPIRATORY_TRACT | Status: AC
  Administered 2021-12-31: 3 mL via RESPIRATORY_TRACT
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
## 2021-12-31 NOTE — ED Notes (Addendum)
Pt ambulated with pulse ox and O2 sats went from 100% down to 94% and HR was steady at 96-98.

## 2021-12-31 NOTE — Discharge Instructions (Addendum)
He did not respond to the nebulization treatment that we gave you here, and while we were not able to reproduce the tachycardia and hypoxia that you had while walking earlier today, I am concerned that you could have something more serious going on in your lungs.  Please go immediately to the emergency department.  Let them know if your chest pain changes, gets worse, or for other concerns.

## 2021-12-31 NOTE — ED Notes (Signed)
Pt was ambulated after Albuterol neb and O2 sats remained at 100%. Pulse 108.

## 2021-12-31 NOTE — ED Triage Notes (Signed)
Pt here 2 weeks post COVID. Pt sxs have imroved, but while ambulating at work today, her O2 sats dropped to 91%, BP increased, and HR was 120. These VS were taken by a nurse at her work.

## 2021-12-31 NOTE — ED Triage Notes (Signed)
Shob that started today around 11 am. Chest pressure. Covid positive 2 weeks ago. Sent here from [facility] for further eval.  UC gave breathing treating. Reports no relief with treatment. Reports o2 decreased to 91% on RA during ambulation at the UC.

## 2022-01-01 ENCOUNTER — Emergency Department (HOSPITAL_BASED_OUTPATIENT_CLINIC_OR_DEPARTMENT_OTHER): Payer: No Typology Code available for payment source

## 2022-01-01 LAB — PREGNANCY, URINE: Preg Test, Ur: NEGATIVE

## 2022-01-01 MED ORDER — IOHEXOL 350 MG/ML SOLN
100.0000 mL | Freq: Once | INTRAVENOUS | Status: AC | PRN
  Administered 2022-01-01: 100 mL via INTRAVENOUS
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
## 2022-01-02 ENCOUNTER — Ambulatory Visit (INDEPENDENT_AMBULATORY_CARE_PROVIDER_SITE_OTHER): Payer: No Typology Code available for payment source | Admitting: Internal Medicine

## 2022-01-02 ENCOUNTER — Other Ambulatory Visit (HOSPITAL_COMMUNITY): Payer: Self-pay

## 2022-01-02 ENCOUNTER — Encounter: Payer: Self-pay | Admitting: Internal Medicine

## 2022-01-02 DIAGNOSIS — R052 Subacute cough: Secondary | ICD-10-CM

## 2022-01-02 MED ORDER — ALBUTEROL SULFATE HFA 108 (90 BASE) MCG/ACT IN AERS
2.0000 | INHALATION_SPRAY | Freq: Four times a day (QID) | RESPIRATORY_TRACT | 0 refills | Status: DC | PRN
  Filled 2022-01-02: qty 6.7, 25d supply, fill #0

## 2022-01-02 MED ORDER — PREDNISONE 20 MG PO TABS
40.0000 mg | ORAL_TABLET | Freq: Every day | ORAL | 0 refills | Status: DC
  Filled 2022-01-02: qty 10, 5d supply, fill #0

## 2022-01-02 MED ORDER — BENZONATATE 200 MG PO CAPS
200.0000 mg | ORAL_CAPSULE | Freq: Three times a day (TID) | ORAL | 0 refills | Status: DC | PRN
  Filled 2022-01-02: qty 60, 20d supply, fill #0

## 2022-01-02 NOTE — Patient Instructions (Signed)
We have sent in prednisone to take 2 pills daily for 5 days.  We have sent in tessalon perles to use for cough.  We have sent in albuterol inhaler just know this can raise heart rate.

## 2022-01-02 NOTE — Progress Notes (Signed)
   Subjective:   Patient ID: Donna Bond, female    DOB: 05-14-1980, 41 y.o.   MRN: [id number]  HPI The patient is a 41 YO female coming in for covid symptoms started about 2 weeks ago with positive test.  Review of Systems  Constitutional: Negative.   HENT: Negative.    Eyes: Negative.   Respiratory:  Positive for cough and shortness of breath. Negative for chest tightness.   Cardiovascular:  Negative for chest pain, palpitations and leg swelling.  Gastrointestinal:  Negative for abdominal distention, abdominal pain, constipation, diarrhea, nausea and vomiting.  Musculoskeletal: Negative.   Skin: Negative.   Neurological: Negative.   Psychiatric/Behavioral: Negative.      Objective:  Physical Exam Constitutional:      Appearance: She is well-developed.  HENT:     Head: Normocephalic and atraumatic.  Cardiovascular:     Rate and Rhythm: Normal rate and regular rhythm.  Pulmonary:     Effort: Pulmonary effort is normal. No respiratory distress.     Breath sounds: Normal breath sounds. No wheezing or rales.  Abdominal:     General: Bowel sounds are normal. There is no distension.     Palpations: Abdomen is soft.     Tenderness: There is no abdominal tenderness. There is no rebound.  Musculoskeletal:     Cervical back: Normal range of motion.  Skin:    General: Skin is warm and dry.  Neurological:     Mental Status: She is alert and oriented to person, place, and time.     Coordination: Coordination normal.     Vitals:   01/02/22 1110  BP: 112/68  Pulse: 86  SpO2: 98%  Weight: 269 lb (122 kg)  Height: 5\' 2"  (1.575 m)    Assessment & Plan:

## 2022-01-04 ENCOUNTER — Encounter: Payer: Self-pay | Admitting: Internal Medicine

## 2022-01-04 DIAGNOSIS — R059 Cough, unspecified: Secondary | ICD-10-CM | POA: Insufficient documentation

## 2022-01-04 NOTE — Assessment & Plan Note (Signed)
Due to covid-19 infection. Rx tessalon perles, albuterol inhaler for prn and prednisone 5 day course.

## 2022-01-09 ENCOUNTER — Other Ambulatory Visit (HOSPITAL_COMMUNITY): Payer: Self-pay

## 2022-01-09 ENCOUNTER — Ambulatory Visit (INDEPENDENT_AMBULATORY_CARE_PROVIDER_SITE_OTHER): Payer: No Typology Code available for payment source | Admitting: Family Medicine

## 2022-01-09 ENCOUNTER — Encounter (INDEPENDENT_AMBULATORY_CARE_PROVIDER_SITE_OTHER): Payer: Self-pay | Admitting: Family Medicine

## 2022-01-09 VITALS — BP 113/76 | HR 63 | Temp 98.5°F | Ht 62.0 in | Wt 266.0 lb

## 2022-01-09 DIAGNOSIS — E669 Obesity, unspecified: Secondary | ICD-10-CM

## 2022-01-09 DIAGNOSIS — R7303 Prediabetes: Secondary | ICD-10-CM

## 2022-01-09 DIAGNOSIS — E559 Vitamin D deficiency, unspecified: Secondary | ICD-10-CM

## 2022-01-09 DIAGNOSIS — Z6841 Body Mass Index (BMI) 40.0 and over, adult: Secondary | ICD-10-CM

## 2022-01-09 DIAGNOSIS — F3289 Other specified depressive episodes: Secondary | ICD-10-CM | POA: Diagnosis not present

## 2022-01-09 DIAGNOSIS — Z9189 Other specified personal risk factors, not elsewhere classified: Secondary | ICD-10-CM

## 2022-01-09 MED ORDER — SEMAGLUTIDE-WEIGHT MANAGEMENT 1.7 MG/0.75ML ~~LOC~~ SOAJ
1.7000 mg | SUBCUTANEOUS | 0 refills | Status: DC
  Filled 2022-01-09: qty 3, 28d supply, fill #0

## 2022-01-09 MED ORDER — BUPROPION HCL ER (SR) 150 MG PO TB12
150.0000 mg | ORAL_TABLET | Freq: Two times a day (BID) | ORAL | 0 refills | Status: DC
  Filled 2022-01-09: qty 60, 30d supply, fill #0

## 2022-01-09 MED ORDER — VITAMIN D (ERGOCALCIFEROL) 1.25 MG (50000 UNIT) PO CAPS
50000.0000 [IU] | ORAL_CAPSULE | ORAL | 0 refills | Status: DC
  Filled 2022-01-09: qty 4, 28d supply, fill #0

## 2022-01-10 ENCOUNTER — Other Ambulatory Visit (HOSPITAL_COMMUNITY): Payer: Self-pay

## 2022-01-11 ENCOUNTER — Other Ambulatory Visit (HOSPITAL_COMMUNITY): Payer: Self-pay

## 2022-01-12 NOTE — Progress Notes (Unsigned)
Chief Complaint:   OBESITY Donna Bond is here to discuss her progress with her obesity treatment plan along with follow-up of her obesity related diagnoses. Donna Bond is on the Category 3 Plan with breakfast options and states she is following her eating plan approximately 30% of the time. Donna Bond states she is not currently exercising.   Today's visit was #: 28 Starting weight: 325 lbs Starting date: 06/08/2020 Today's weight: 266 lbs Today's date: 01/09/2022 Total lbs lost to date: 59 lbs Total lbs lost since last in-office visit: 3 lbs  Interim History: Donna Bond had Covid real bad 3 weeks ago: headache, fever and chills, achy, nausea, vomiting, and shortness of breath and could not eat. Her snacks she ate were fruit, yogurt, grapes, and pears.  Subjective:   1. Prediabetes Recent GI issues and having Covid, Donna Bond stopped [facility] for 2 weeks and is now back on it. Donna Bond reports no issues with reflux or constipation since back on Wegovy. Also reports no cravings or urges.   2. Other depression, with emotional eating Donna Bond denies emotional eating at all. Her self care and stress relieving activities include: A bath, taking a walk, reading, and has used the CALM app.  3. Vitamin D deficiency Donna Bond has hyperlipidemia and has been trying to improve her cholesterol levels with intensive lifestyle modifications including a low saturated fat diet, exercise, and weight loss. She denies any chest pain, claudication, or myalgias.  Lab Results  Component Value Date   ALT 25 12/31/2021   AST 21 12/31/2021   ALKPHOS 93 12/31/2021   BILITOT 0.3 12/31/2021   Lab Results  Component Value Date   CHOL 111 10/09/2021   HDL 40 10/09/2021   LDLCALC 59 10/09/2021   TRIG 48 10/09/2021   CHOLHDL 2.8 10/09/2021    4. At risk for constipation Donna Bond is at risk for constipation due to reassuming [facility] and recently having GI issues and Covid symptoms recently.  Assessment/Plan:  No orders of the defined types were placed in  this encounter.   Medications Discontinued During This Encounter  Medication Reason   buPROPion (WELLBUTRIN SR) 150 MG 12 hr tablet Reorder   Semaglutide-Weight Management 1.7 MG/0.75ML SOAJ Reorder   Vitamin D, Ergocalciferol, (DRISDOL) 1.25 MG (50000 UNIT) CAPS capsule Reorder     Meds ordered this encounter  Medications   buPROPion (WELLBUTRIN SR) 150 MG 12 hr tablet    Sig: Take 1 tablet (150 mg total) by mouth 2 (two) times daily.    Dispense:  60 tablet    Refill:  0    30 d supply; ov for rf   Vitamin D, Ergocalciferol, (DRISDOL) 1.25 MG (50000 UNIT) CAPS capsule    Sig: Take 1 capsule (50,000 Units total) by mouth every 7 (seven) days.    Dispense:  4 capsule    Refill:  0    Needs ov for RF; 90 d supply   Semaglutide-Weight Management 1.7 MG/0.75ML SOAJ    Sig: Inject 1.7 mg into the skin once a week.    Dispense:  3 mL    Refill:  0     1. Prediabetes Donna Bond will continue to work on weight loss, exercise, and decreasing simple carbohydrates to help decrease the risk of diabetes. Advised Donna Bond of labs for A1c and BMP at next office visit.  2. Other depression, with emotional eating Behavior modification techniques were discussed today to help Donna Bond deal with her emotional/non-hunger eating behaviors.  Orders and follow up as documented in  patient record.  Will refill Wellbutrin as follows: - buPROPion (WELLBUTRIN SR) 150 MG 12 hr tablet; Take 1 tablet (150 mg total) by mouth 2 (two) times daily.  Dispense: 60 tablet; Refill: 0  3. Vitamin D deficiency Will check Vitamin D level at next office visit and refill Vitamin D as follows: - Vitamin D, Ergocalciferol, (DRISDOL) 1.25 MG (50000 UNIT) CAPS capsule; Take 1 capsule (50,000 Units total) by mouth every 7 (seven) days.  Dispense: 4 capsule; Refill: 0  4. At risk for constipation Donna Bond was given approximately 9 minutes of counseling today regarding prevention of constipation. She was encouraged to increase water and fiber  intake.    5. Obesity, current BMI 48.8 Donna Bond is currently in the action stage of change. As such, her goal is to continue with weight loss efforts. She has agreed to Category 2 Plan with breakfast and lunch options. Since Donna Bond has lost almost 60 lbs, she decrease to Category 2 Plan. She has asked to have her IC repeated at her next office visit. Will refill Wegovy 1.7 mg weekly.  Exercise goals: As is.  Behavioral modification strategies: increasing lean protein intake, decreasing simple carbohydrates, and planning for success.  Donna Bond has agreed to follow-up with our clinic in 4 weeks and will repeat IC, fasting. She was informed of the importance of frequent follow-up visits to maximize her success with intensive lifestyle modifications for her multiple health conditions.   Objective:   Blood pressure 113/76, pulse 63, temperature 98.5 F (36.9 C), height 5\' 2"  (1.575 m), weight 266 lb (120.7 kg), SpO2 98 %. Body mass index is 48.65 kg/m.  General: Cooperative, alert, well developed, in no acute distress. HEENT: Conjunctivae and lids unremarkable. Cardiovascular: Regular rhythm.  Lungs: Normal work of breathing. Neurologic: No focal deficits.   Lab Results  Component Value Date   CREATININE 0.96 12/31/2021   BUN 13 12/31/2021   NA 138 12/31/2021   K 3.8 12/31/2021   CL 105 12/31/2021   CO2 26 12/31/2021   Lab Results  Component Value Date   ALT 25 12/31/2021   AST 21 12/31/2021   ALKPHOS 93 12/31/2021   BILITOT 0.3 12/31/2021   Lab Results  Component Value Date   HGBA1C 5.4 10/09/2021   HGBA1C 5.6 07/09/2021   HGBA1C 5.7 (H) 03/05/2021   HGBA1C 6.1 10/31/2020   HGBA1C 6.0 (H) 06/08/2020   Lab Results  Component Value Date   INSULIN 9.7 10/09/2021   INSULIN 15.5 07/09/2021   INSULIN 18.2 06/08/2020   Lab Results  Component Value Date   TSH 1.180 07/09/2021   Lab Results  Component Value Date   CHOL 111 10/09/2021   HDL 40 10/09/2021   LDLCALC 59 10/09/2021    TRIG 48 10/09/2021   CHOLHDL 2.8 10/09/2021   Lab Results  Component Value Date   VD25OH 43.4 10/09/2021   VD25OH 80.6 07/09/2021   VD25OH 73.9 03/05/2021   Lab Results  Component Value Date   WBC 13.8 (H) 12/31/2021   HGB 12.9 12/31/2021   HCT 41.1 12/31/2021   MCV 69.8 (L) 12/31/2021   PLT 347 12/31/2021   Lab Results  Component Value Date   IRON 36 10/09/2021   TIBC 325 10/09/2021   FERRITIN 145 10/09/2021    Attestation Statements:   Reviewed by clinician on day of visit: allergies, medications, problem list, medical history, surgical history, family history, social history, and previous encounter notes.  Onnie Boer, CMA, am acting as transcriptionist for Dr.  Izzak Fries, DO   I have reviewed the above documentation for accuracy and completeness, and I agree with the above. Marjory Sneddon, D.O.  The [place] was signed into law in 2016 which includes the topic of electronic health records.  This provides immediate access to information in MyChart.  This includes consultation notes, operative notes, office notes, lab results and pathology reports.  If you have any questions about what you read please let us know at your next visit so we can discuss your concerns and take corrective action if need be.  We are right here with you.

## 2022-02-11 ENCOUNTER — Ambulatory Visit (INDEPENDENT_AMBULATORY_CARE_PROVIDER_SITE_OTHER): Payer: No Typology Code available for payment source | Admitting: Family Medicine

## 2022-02-11 ENCOUNTER — Other Ambulatory Visit (HOSPITAL_COMMUNITY): Payer: Self-pay

## 2022-02-11 ENCOUNTER — Encounter (INDEPENDENT_AMBULATORY_CARE_PROVIDER_SITE_OTHER): Payer: Self-pay | Admitting: Family Medicine

## 2022-02-11 VITALS — BP 122/85 | HR 73 | Temp 98.0°F | Ht 62.0 in | Wt 266.0 lb

## 2022-02-11 DIAGNOSIS — Z6841 Body Mass Index (BMI) 40.0 and over, adult: Secondary | ICD-10-CM | POA: Diagnosis not present

## 2022-02-11 DIAGNOSIS — F3289 Other specified depressive episodes: Secondary | ICD-10-CM

## 2022-02-11 DIAGNOSIS — E669 Obesity, unspecified: Secondary | ICD-10-CM | POA: Diagnosis not present

## 2022-02-11 MED ORDER — BUPROPION HCL ER (SR) 150 MG PO TB12
150.0000 mg | ORAL_TABLET | Freq: Two times a day (BID) | ORAL | 0 refills | Status: DC
  Filled 2022-02-11 – 2022-02-27 (×2): qty 60, 30d supply, fill #0

## 2022-02-11 MED ORDER — WEGOVY 2.4 MG/0.75ML ~~LOC~~ SOAJ
2.4000 mg | SUBCUTANEOUS | 0 refills | Status: DC
  Filled 2022-02-11 – 2022-02-27 (×2): qty 3, 28d supply, fill #0

## 2022-02-20 ENCOUNTER — Other Ambulatory Visit (HOSPITAL_COMMUNITY): Payer: Self-pay

## 2022-02-25 NOTE — Progress Notes (Unsigned)
Chief Complaint:   OBESITY Donna Bond is here to discuss her progress with her obesity treatment plan along with follow-up of her obesity related diagnoses. Donna Bond is on the Category 2 Plan and states she is following her eating plan approximately 60% of the time. Donna Bond states she is doing 0 minutes 0 times per week.  Today's visit was #: 23 Starting weight: 325 lbs Starting date: 06/08/2020 Today's weight: 266 lbs Today's date: 02/11/2022 Total lbs lost to date: 44 Total lbs lost since last in-office visit: 0  Interim History: Patient had her grandmother's birthday party, she turned 41 years old and she had more gatherings and traveling. She lost 1.4 lbs in fat mass and gained 1 lb in muscle. She is not drinking liquid calories and getting in a minimum of 100 oz of water.   Subjective:   1. Other depression, with emotional eating Patient is doing great emotionally, and she has no issues. She denies emotional eating.   Assessment/Plan:  No orders of the defined types were placed in this encounter.   Medications Discontinued During This Encounter  Medication Reason   Semaglutide-Weight Management 1.7 MG/0.75ML SOAJ    buPROPion (WELLBUTRIN SR) 150 MG 12 hr tablet Reorder     Meds ordered this encounter  Medications   buPROPion (WELLBUTRIN SR) 150 MG 12 hr tablet    Sig: Take 1 tablet (150 mg total) by mouth 2 (two) times daily.    Dispense:  60 tablet    Refill:  0    30 d supply; ov for rf   Semaglutide-Weight Management (WEGOVY) 2.4 MG/0.75ML SOAJ    Sig: Inject 2.4 mg into the skin once a week on Thursday    Dispense:  3 mL    Refill:  0     1. Other depression, with emotional eating We will refill Wellbutrin SR for 1 month with no change in dose. She is to try to increase her activity.   - buPROPion (WELLBUTRIN SR) 150 MG 12 hr tablet; Take 1 tablet (150 mg total) by mouth 2 (two) times daily.  Dispense: 60 tablet; Refill: 0  2. Obesity, current BMI 48.7 Patient with  increased simple carbohydrate cravings. We will increase her dose of Wegovy from 1.7 mg to 2.4 mg once weekly, and will refill for 1 month.   - Semaglutide-Weight Management (WEGOVY) 2.4 MG/0.75ML SOAJ; Inject 2.4 mg into the skin once a week on Thursday  Dispense: 3 mL; Refill: 0  Donna Bond is currently in the action stage of change. As such, her goal is to continue with weight loss efforts. She has agreed to the Category 2 Plan.   We will repeat fasting IC and labs at her next visit. Patient was told to come 30 minutes prior to her visit.   Exercise goals: All adults should avoid inactivity. Some physical activity is better than none, and adults who participate in any amount of physical activity gain some health benefits.  Behavioral modification strategies: celebration eating strategies and planning for success.  Donna Bond has agreed to follow-up with our clinic in 3 to 4 weeks. She was informed of the importance of frequent follow-up visits to maximize her success with intensive lifestyle modifications for her multiple health conditions.   Objective:   Blood pressure 122/85, pulse 73, temperature 98 F (36.7 C), height 5\' 2"  (1.575 m), weight 266 lb (120.7 kg), SpO2 99 %. Body mass index is 48.65 kg/m.  General: Cooperative, alert, well developed, in no acute  distress. HEENT: Conjunctivae and lids unremarkable. Cardiovascular: Regular rhythm.  Lungs: Normal work of breathing. Neurologic: No focal deficits.   Lab Results  Component Value Date   CREATININE 0.96 12/31/2021   BUN 13 12/31/2021   NA 138 12/31/2021   K 3.8 12/31/2021   CL 105 12/31/2021   CO2 26 12/31/2021   Lab Results  Component Value Date   ALT 25 12/31/2021   AST 21 12/31/2021   ALKPHOS 93 12/31/2021   BILITOT 0.3 12/31/2021   Lab Results  Component Value Date   HGBA1C 5.4 10/09/2021   HGBA1C 5.6 07/09/2021   HGBA1C 5.7 (H) 03/05/2021   HGBA1C 6.1 10/31/2020   HGBA1C 6.0 (H) 06/08/2020   Lab Results   Component Value Date   INSULIN 9.7 10/09/2021   INSULIN 15.5 07/09/2021   INSULIN 18.2 06/08/2020   Lab Results  Component Value Date   TSH 1.180 07/09/2021   Lab Results  Component Value Date   CHOL 111 10/09/2021   HDL 40 10/09/2021   LDLCALC 59 10/09/2021   TRIG 48 10/09/2021   CHOLHDL 2.8 10/09/2021   Lab Results  Component Value Date   VD25OH 43.4 10/09/2021   VD25OH 80.6 07/09/2021   VD25OH 73.9 03/05/2021   Lab Results  Component Value Date   WBC 13.8 (H) 12/31/2021   HGB 12.9 12/31/2021   HCT 41.1 12/31/2021   MCV 69.8 (L) 12/31/2021   PLT 347 12/31/2021   Lab Results  Component Value Date   IRON 36 10/09/2021   TIBC 325 10/09/2021   FERRITIN 145 10/09/2021   Attestation Statements:   Reviewed by clinician on day of visit: allergies, medications, problem list, medical history, surgical history, family history, social history, and previous encounter notes.   Wilhemena Durie, am acting as transcriptionist for [employer], DO.   I have reviewed the above documentation for accuracy and completeness, and I agree with the above. Marjory Sneddon, D.O.  The [place] was signed into law in 2016 which includes the topic of electronic health records.  This provides immediate access to information in MyChart.  This includes consultation notes, operative notes, office notes, lab results and pathology reports.  If you have any questions about what you read please let us know at your next visit so we can discuss your concerns and take corrective action if need be.  We are right here with you.

## 2022-02-27 ENCOUNTER — Other Ambulatory Visit (INDEPENDENT_AMBULATORY_CARE_PROVIDER_SITE_OTHER): Payer: Self-pay | Admitting: Family Medicine

## 2022-02-27 ENCOUNTER — Other Ambulatory Visit (HOSPITAL_COMMUNITY): Payer: Self-pay

## 2022-02-27 DIAGNOSIS — E559 Vitamin D deficiency, unspecified: Secondary | ICD-10-CM

## 2022-02-28 ENCOUNTER — Other Ambulatory Visit (HOSPITAL_COMMUNITY): Payer: Self-pay

## 2022-03-13 ENCOUNTER — Encounter: Payer: Self-pay | Admitting: Internal Medicine

## 2022-03-13 ENCOUNTER — Other Ambulatory Visit (HOSPITAL_COMMUNITY): Payer: Self-pay

## 2022-03-13 ENCOUNTER — Ambulatory Visit (INDEPENDENT_AMBULATORY_CARE_PROVIDER_SITE_OTHER): Payer: No Typology Code available for payment source | Admitting: Internal Medicine

## 2022-03-13 VITALS — BP 120/86 | HR 91 | Temp 98.1°F | Ht 62.0 in | Wt 273.0 lb

## 2022-03-13 DIAGNOSIS — Z0000 Encounter for general adult medical examination without abnormal findings: Secondary | ICD-10-CM | POA: Diagnosis not present

## 2022-03-13 DIAGNOSIS — R252 Cramp and spasm: Secondary | ICD-10-CM | POA: Diagnosis not present

## 2022-03-13 DIAGNOSIS — Z1231 Encounter for screening mammogram for malignant neoplasm of breast: Secondary | ICD-10-CM

## 2022-03-13 MED ORDER — MELOXICAM 15 MG PO TABS
15.0000 mg | ORAL_TABLET | Freq: Every day | ORAL | 0 refills | Status: DC
  Filled 2022-03-13: qty 30, 30d supply, fill #0

## 2022-03-13 NOTE — Progress Notes (Signed)
   Subjective:   Patient ID: Donna Bond, female    DOB: April 30, 1980, 41 y.o.   MRN: [id number]  HPI The patient is here for physical.  PMH, [facility], social history reviewed and updated  Review of Systems  Constitutional: Negative.   HENT: Negative.    Eyes: Negative.   Respiratory:  Negative for cough, chest tightness and shortness of breath.   Cardiovascular:  Negative for chest pain, palpitations and leg swelling.  Gastrointestinal:  Negative for abdominal distention, abdominal pain, constipation, diarrhea, nausea and vomiting.  Musculoskeletal:  Positive for myalgias.  Skin: Negative.   Neurological: Negative.   Psychiatric/Behavioral: Negative.      Objective:  Physical Exam Constitutional:      Appearance: She is well-developed. She is obese.  HENT:     Head: Normocephalic and atraumatic.  Cardiovascular:     Rate and Rhythm: Normal rate and regular rhythm.  Pulmonary:     Effort: Pulmonary effort is normal. No respiratory distress.     Breath sounds: Normal breath sounds. No wheezing or rales.  Abdominal:     General: Bowel sounds are normal. There is no distension.     Palpations: Abdomen is soft.     Tenderness: There is no abdominal tenderness. There is no rebound.  Musculoskeletal:        General: Tenderness present.     Cervical back: Normal range of motion.  Skin:    General: Skin is warm and dry.  Neurological:     Mental Status: She is alert and oriented to person, place, and time.     Coordination: Coordination normal.     Vitals:   03/13/22 0925  BP: 120/86  Pulse: 91  Temp: 98.1 F (36.7 C)  TempSrc: Oral  SpO2: 97%  Weight: 273 lb (123.8 kg)  Height: 5\' 2"  (1.575 m)    Assessment & Plan:

## 2022-03-13 NOTE — Assessment & Plan Note (Signed)
She is taking wegovy 2.4 mg weekly and weight stable, down 30 pounds in the last year. She had more success with mounjaro and will talk with her weight loss provider if zepbound is an option for her.
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
## 2022-03-13 NOTE — Assessment & Plan Note (Signed)
Rx meloxicam 15 mg daily and given exercises to help.

## 2022-03-13 NOTE — Patient Instructions (Addendum)
We have sent in meloxicam to take 1 pill daily for 1-2 weeks to help the leg.   We have added some stretching exercises to do for the leg.

## 2022-03-25 ENCOUNTER — Ambulatory Visit (INDEPENDENT_AMBULATORY_CARE_PROVIDER_SITE_OTHER): Payer: No Typology Code available for payment source | Admitting: Family Medicine

## 2022-03-25 ENCOUNTER — Encounter (INDEPENDENT_AMBULATORY_CARE_PROVIDER_SITE_OTHER): Payer: Self-pay | Admitting: Family Medicine

## 2022-03-25 ENCOUNTER — Other Ambulatory Visit (HOSPITAL_COMMUNITY): Payer: Self-pay

## 2022-03-25 VITALS — BP 125/84 | HR 75 | Temp 97.9°F | Ht 62.0 in | Wt 266.6 lb

## 2022-03-25 DIAGNOSIS — R0602 Shortness of breath: Secondary | ICD-10-CM | POA: Insufficient documentation

## 2022-03-25 DIAGNOSIS — R7303 Prediabetes: Secondary | ICD-10-CM

## 2022-03-25 DIAGNOSIS — E669 Obesity, unspecified: Secondary | ICD-10-CM

## 2022-03-25 DIAGNOSIS — Z6841 Body Mass Index (BMI) 40.0 and over, adult: Secondary | ICD-10-CM | POA: Diagnosis not present

## 2022-03-25 DIAGNOSIS — E559 Vitamin D deficiency, unspecified: Secondary | ICD-10-CM

## 2022-03-25 MED ORDER — WEGOVY 2.4 MG/0.75ML ~~LOC~~ SOAJ
2.4000 mg | SUBCUTANEOUS | 1 refills | Status: DC
  Filled 2022-03-25: qty 3, 28d supply, fill #0

## 2022-03-25 MED ORDER — VITAMIN D (ERGOCALCIFEROL) 1.25 MG (50000 UNIT) PO CAPS
50000.0000 [IU] | ORAL_CAPSULE | ORAL | 0 refills | Status: DC
  Filled 2022-03-25: qty 8, 56d supply, fill #0

## 2022-03-26 LAB — HEMOGLOBIN A1C
Est. average glucose Bld gHb Est-mCnc: 111 mg/dL
Hgb A1c MFr Bld: 5.5 % (ref 4.8–5.6)

## 2022-03-26 LAB — COMPREHENSIVE METABOLIC PANEL
ALT: 14 IU/L (ref 0–32)
AST: 14 IU/L (ref 0–40)
Albumin/Globulin Ratio: 1.2 (ref 1.2–2.2)
Albumin: 4.1 g/dL (ref 3.9–4.9)
Alkaline Phosphatase: 119 IU/L (ref 44–121)
BUN/Creatinine Ratio: 19 (ref 9–23)
BUN: 15 mg/dL (ref 6–24)
Bilirubin Total: 0.5 mg/dL (ref 0.0–1.2)
CO2: 20 mmol/L (ref 20–29)
Calcium: 8.7 mg/dL (ref 8.7–10.2)
Chloride: 102 mmol/L (ref 96–106)
Creatinine, Ser: 0.81 mg/dL (ref 0.57–1.00)
Globulin, Total: 3.5 g/dL (ref 1.5–4.5)
Glucose: 77 mg/dL (ref 70–99)
Potassium: 4.1 mmol/L (ref 3.5–5.2)
Sodium: 139 mmol/L (ref 134–144)
Total Protein: 7.6 g/dL (ref 6.0–8.5)
eGFR: 94 mL/min/{1.73_m2} (ref >59)

## 2022-03-26 LAB — INSULIN, RANDOM: INSULIN: 6.1 u[IU]/mL (ref 2.6–24.9)

## 2022-03-26 LAB — VITAMIN D 25 HYDROXY (VIT D DEFICIENCY, FRACTURES): Vit D, 25-Hydroxy: 31.3 ng/mL (ref 30.0–100.0)

## 2022-04-02 NOTE — Progress Notes (Signed)
Chief Complaint:   OBESITY Donna Bond is here to discuss her progress with her obesity treatment plan along with follow-up of her obesity related diagnoses. Donna Bond is on the Category 2 Plan with breakfast and lunch options and states she is following her eating plan approximately 60% of the time. Donna Bond states she is walking 30 minutes 3 times per week.  Today's visit was #: 30 Starting weight: 325 LBS Starting date: 06/08/2020 Today's weight: 266 LBS Today's date: 03/25/2022 Total lbs lost to date: 59 LBS Total lbs lost since last in-office visit: 0  Interim History: Patients weight has went up almost 3 pounds.  She lost 2 pounds in fat mass since last office visit.  She is doing great.  Subjective:   1. SOBOE (shortness of breath on exertion) Repeat IC today.  06/08/2020: 2382, now after 60 pound weight loss REE is 1555.   2.  Vitamin D deficiency Patient has been on vitamin D for a couple of weeks, and is taking it regularly.  She is tolerating well.  3. Prediabetes Donna Bond has a diagnosis of prediabetes based on her elevated HgA1c and was informed this puts her at greater risk of developing diabetes. She continues to work on diet and exercise to decrease her risk of diabetes. She denies nausea or hypoglycemia.  Assessment/Plan:   Orders Placed This Encounter  Procedures   VITAMIN D 25 Hydroxy (Vit-D Deficiency, Fractures)   Insulin, random   Hemoglobin A1c   Comprehensive metabolic panel    Medications Discontinued During This Encounter  Medication Reason   Vitamin D, Ergocalciferol, (DRISDOL) 1.25 MG (50000 UNIT) CAPS capsule Reorder   Semaglutide-Weight Management (WEGOVY) 2.4 MG/0.75ML SOAJ Reorder     Meds ordered this encounter  Medications   Vitamin D, Ergocalciferol, (DRISDOL) 1.25 MG (50000 UNIT) CAPS capsule    Sig: Take 1 capsule (50,000 Units total) by mouth every 7 (seven) days.    Dispense:  8 capsule    Refill:  0    Needs ov for RF; 60 d supply    Semaglutide-Weight Management (WEGOVY) 2.4 MG/0.75ML SOAJ    Sig: Inject 2.4 mg into the skin once a week on Thursday    Dispense:  3 mL    Refill:  1     1. SOBOE (shortness of breath on exertion)  I recommend she continue with the category 2 meal plan and focus on not skipping meals and factors that increase and decrease metabolism discussed with patient  2. Vitamin D deficiency Low Vitamin D level contributes to fatigue and are associated with obesity, breast, and colon cancer. She agrees to continue to take prescription Vitamin D @50 ,000 IU every week and will follow-up for routine testing of Vitamin D, at least 2-3 times per year to avoid over-replacement.  Refill - Vitamin D, Ergocalciferol, (DRISDOL) 1.25 MG (50000 UNIT) CAPS capsule; Take 1 capsule (50,000 Units total) by mouth every 7 (seven) days.  Dispense: 8 capsule; Refill: 0  Check labs today.   - VITAMIN D 25 Hydroxy (Vit-D Deficiency, Fractures)  3. Prediabetes Donna Bond will continue to work on weight loss, exercise, and decreasing simple carbohydrates to help decrease the risk of diabetes.   - Insulin, random - Hemoglobin A1c - Comprehensive metabolic panel  4. Obesity, current BMI 48.8 She is doing well on Wegovy, she injects on Wednesday, she is tolerating well.  Refill- Semaglutide-Weight Management (WEGOVY) 2.4 MG/0.75ML SOAJ; Inject 2.4 mg into the skin once a week on Thursday  Dispense:  3 mL; Refill: 1  Donna Bond is currently in the action stage of change. As such, her goal is to continue with weight loss efforts. She has agreed to the Category 2 Plan.   Exercise goals: For substantial health benefits, adults should do at least 150 minutes (2 hours and 30 minutes) a week of moderate-intensity, or 75 minutes (1 hour and 15 minutes) a week of vigorous-intensity aerobic physical activity, or an equivalent combination of moderate- and vigorous-intensity aerobic activity. Aerobic activity should be performed in episodes of at  least 10 minutes, and preferably, it should be spread throughout the week.  Behavioral modification strategies: increasing lean protein intake.  Donna Bond has agreed to follow-up with our clinic in 4 weeks. She was informed of the importance of frequent follow-up visits to maximize her success with intensive lifestyle modifications for her multiple health conditions.   Objective:   Blood pressure 125/84, pulse 75, temperature 97.9 F (36.6 C), height 5\' 2"  (1.575 m), weight 266 lb 9.6 oz (120.9 kg), SpO2 100 %. Body mass index is 48.76 kg/m.  General: Cooperative, alert, well developed, in no acute distress. HEENT: Conjunctivae and lids unremarkable. Cardiovascular: Regular rhythm.  Lungs: Normal work of breathing. Neurologic: No focal deficits.   Lab Results  Component Value Date   CREATININE 0.81 03/25/2022   BUN 15 03/25/2022   NA 139 03/25/2022   K 4.1 03/25/2022   CL 102 03/25/2022   CO2 20 03/25/2022   Lab Results  Component Value Date   ALT 14 03/25/2022   AST 14 03/25/2022   ALKPHOS 119 03/25/2022   BILITOT 0.5 03/25/2022   Lab Results  Component Value Date   HGBA1C 5.5 03/25/2022   HGBA1C 5.4 10/09/2021   HGBA1C 5.6 07/09/2021   HGBA1C 5.7 (H) 03/05/2021   HGBA1C 6.1 10/31/2020   Lab Results  Component Value Date   INSULIN 6.1 03/25/2022   INSULIN 9.7 10/09/2021   INSULIN 15.5 07/09/2021   INSULIN 18.2 06/08/2020   Lab Results  Component Value Date   TSH 1.180 07/09/2021   Lab Results  Component Value Date   CHOL 111 10/09/2021   HDL 40 10/09/2021   LDLCALC 59 10/09/2021   TRIG 48 10/09/2021   CHOLHDL 2.8 10/09/2021   Lab Results  Component Value Date   VD25OH 31.3 03/25/2022   VD25OH 43.4 10/09/2021   VD25OH 80.6 07/09/2021   Lab Results  Component Value Date   WBC 13.8 (H) 12/31/2021   HGB 12.9 12/31/2021   HCT 41.1 12/31/2021   MCV 69.8 (L) 12/31/2021   PLT 347 12/31/2021   Lab Results  Component Value Date   IRON 36 10/09/2021    TIBC 325 10/09/2021   FERRITIN 145 10/09/2021    Attestation Statements:   Reviewed by clinician on day of visit: allergies, medications, problem list, medical history, surgical history, family history, social history, and previous encounter notes.  I, 10/11/2021, RMA, am acting as [employer] for Energy manager, DO.  I have reviewed the above documentation for accuracy and completeness, and I agree with the above. [employer], D.O.  The 21st Century Cures Act was signed into law in 2016 which includes the topic of electronic health records.  This provides immediate access to information in MyChart.  This includes consultation notes, operative notes, office notes, lab results and pathology reports.  If you have any questions about what you read please let 2017 know at your next visit so we can discuss your concerns and take  corrective action if need be.  We are right here with you.

## 2022-04-10 ENCOUNTER — Other Ambulatory Visit (HOSPITAL_COMMUNITY): Payer: Self-pay

## 2022-04-24 ENCOUNTER — Encounter (INDEPENDENT_AMBULATORY_CARE_PROVIDER_SITE_OTHER): Payer: Self-pay | Admitting: Family Medicine

## 2022-04-24 ENCOUNTER — Ambulatory Visit (INDEPENDENT_AMBULATORY_CARE_PROVIDER_SITE_OTHER): Payer: 59 | Admitting: Family Medicine

## 2022-04-24 ENCOUNTER — Other Ambulatory Visit (HOSPITAL_COMMUNITY): Payer: Self-pay

## 2022-04-24 VITALS — BP 135/75 | HR 68 | Temp 98.6°F | Ht 62.0 in | Wt 274.4 lb

## 2022-04-24 DIAGNOSIS — Z6841 Body Mass Index (BMI) 40.0 and over, adult: Secondary | ICD-10-CM

## 2022-04-24 DIAGNOSIS — R7303 Prediabetes: Secondary | ICD-10-CM

## 2022-04-24 DIAGNOSIS — E669 Obesity, unspecified: Secondary | ICD-10-CM | POA: Diagnosis not present

## 2022-04-24 DIAGNOSIS — E559 Vitamin D deficiency, unspecified: Secondary | ICD-10-CM

## 2022-04-24 MED ORDER — VITAMIN D (ERGOCALCIFEROL) 1.25 MG (50000 UNIT) PO CAPS
50000.0000 [IU] | ORAL_CAPSULE | ORAL | 0 refills | Status: DC
  Filled 2022-04-24: qty 4, 28d supply, fill #0
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
## 2022-05-08 NOTE — Progress Notes (Unsigned)
Chief Complaint:   OBESITY Donna Bond is here to discuss her progress with her obesity treatment plan along with follow-up of her obesity related diagnoses. Donna Bond is on the Category 2 Plan and states she is following her eating plan approximately 20% of the time. Donna Bond states she is not exercising.  Today's visit was #: 2 Starting weight: 325 LBS Starting date: 06/08/2020 Today's weight: 274 LBS Today's date: 04/24/2022 Total lbs lost since last in-office visit: +8 LBS  Interim History: Patient had a birthday, Christmas.  Her and the kids were sick over the holidays.  She fell off track a bit.  Ready to refocus, she skipped a couple of shots of Wegovy.  She is here to review labs drawn at last office visit.  Subjective:   1. Prediabetes Discussed labs with patient today. Patient denies polyuria or polydipsia.  Patient denies issues with hunger or cravings when taking Wegovy.  Patient has been skipping doses lately due to illness.  2. Vitamin D deficiency Discussed labs with patient today. Vitamin D 31.3 at goal.  Patient has been skipping doses.  Assessment/Plan:  No orders of the defined types were placed in this encounter.   Medications Discontinued During This Encounter  Medication Reason   Vitamin D, Ergocalciferol, (DRISDOL) 1.25 MG (50000 UNIT) CAPS capsule Reorder     Meds ordered this encounter  Medications   Vitamin D, Ergocalciferol, (DRISDOL) 1.25 MG (50000 UNIT) CAPS capsule    Sig: Take 1 capsule (50,000 Units total) by mouth every 7 (seven) days.    Dispense:  4 capsule    Refill:  0    Needs ov for RF; 60 d supply     1. Prediabetes A1c stable and fasting insulin has improved.  Continue semaglutide, patient declines need for refills today.  Continue PNP and increase exercise.  2. Vitamin D deficiency - I again reiterated the importance of vitamin D (as well as calcium) to their health and wellbeing.  - I reviewed possible symptoms of low Vitamin D:  low  energy, depressed mood, muscle aches, joint aches, osteoporosis etc. - low Vitamin D levels may be linked to an increased risk of cardiovascular events and even increased risk of cancers- such as colon and breast.  - ideal vitamin D levels reviewed with patient  - I recommend pt take a 50,000 IU weekly prescription vit D - see script below   - Informed patient this may be a lifelong thing, and she was encouraged to continue to take the medicine until told otherwise.    - weight loss will likely improve availability of vitamin D, thus encouraged Donna Bond to continue with meal plan and their weight loss efforts to further improve this condition.  Thus, we will need to monitor levels regularly (every 3-4 mo on average) to keep levels within normal limits and prevent over supplementation. - pt's questions and concerns regarding this condition addressed.  Refill- Vitamin D, Ergocalciferol, (DRISDOL) 1.25 MG (50000 UNIT) CAPS capsule; Take 1 capsule (50,000 Units total) by mouth every 7 (seven) days.  Dispense: 4 capsule; Refill: 0  3. Obesity, current BMI 50.2 Patient's goals:  1.  Meal plan, prep. 2.  Weight foods and measuring. 3.  30 minutes 3 days/week of walking and exercise.  Meal plan handout given to patient again today and reviewed.  Donna Bond is currently in the action stage of change. As such, her goal is to continue with weight loss efforts. She has agreed to the Category 2  Plan with breakfast and lunch options..   Exercise goals: All adults should avoid inactivity. Some physical activity is better than none, and adults who participate in any amount of physical activity gain some health benefits.  Behavioral modification strategies: meal planning and cooking strategies and planning for success.  Donna Bond has agreed to follow-up with our clinic in 3-4 weeks. She was informed of the importance of frequent follow-up visits to maximize her success with intensive lifestyle modifications for her multiple  health conditions.   Objective:   Blood pressure 135/75, pulse 68, temperature 98.6 F (37 C), height 5\' 2"  (1.575 m), weight 274 lb 6.4 oz (124.5 kg), SpO2 100 %. Body mass index is 50.19 kg/m.  General: Cooperative, alert, well developed, in no acute distress. HEENT: Conjunctivae and lids unremarkable. Cardiovascular: Regular rhythm.  Lungs: Normal work of breathing. Neurologic: No focal deficits.   Lab Results  Component Value Date   CREATININE 0.81 03/25/2022   BUN 15 03/25/2022   NA 139 03/25/2022   K 4.1 03/25/2022   CL 102 03/25/2022   CO2 20 03/25/2022   Lab Results  Component Value Date   ALT 14 03/25/2022   AST 14 03/25/2022   ALKPHOS 119 03/25/2022   BILITOT 0.5 03/25/2022   Lab Results  Component Value Date   HGBA1C 5.5 03/25/2022   HGBA1C 5.4 10/09/2021   HGBA1C 5.6 07/09/2021   HGBA1C 5.7 (H) 03/05/2021   HGBA1C 6.1 10/31/2020   Lab Results  Component Value Date   INSULIN 6.1 03/25/2022   INSULIN 9.7 10/09/2021   INSULIN 15.5 07/09/2021   INSULIN 18.2 06/08/2020   Lab Results  Component Value Date   TSH 1.180 07/09/2021   Lab Results  Component Value Date   CHOL 111 10/09/2021   HDL 40 10/09/2021   LDLCALC 59 10/09/2021   TRIG 48 10/09/2021   CHOLHDL 2.8 10/09/2021   Lab Results  Component Value Date   VD25OH 31.3 03/25/2022   VD25OH 43.4 10/09/2021   VD25OH 80.6 07/09/2021   Lab Results  Component Value Date   WBC 13.8 (H) 12/31/2021   HGB 12.9 12/31/2021   HCT 41.1 12/31/2021   MCV 69.8 (L) 12/31/2021   PLT 347 12/31/2021   Lab Results  Component Value Date   IRON 36 10/09/2021   TIBC 325 10/09/2021   FERRITIN 145 10/09/2021    Attestation Statements:   Reviewed by clinician on day of visit: allergies, medications, problem list, medical history, surgical history, family history, social history, and previous encounter notes.  I, Davy Pique, RMA, am acting as Location manager for [employer], DO.   I have  reviewed the above documentation for accuracy and completeness, and I agree with the above. Marjory Sneddon, D.O.  [employer] [place] was signed into law in 2016 which includes the topic of electronic health records.  This provides immediate access to information in MyChart.  This includes consultation notes, operative notes, office notes, lab results and pathology reports.  If you have any questions about what you read please let us know at your next visit so we can discuss your concerns and take corrective action if need be.  We are right here with you.

## 2022-05-23 ENCOUNTER — Ambulatory Visit (INDEPENDENT_AMBULATORY_CARE_PROVIDER_SITE_OTHER): Payer: 59 | Admitting: Family Medicine

## 2022-05-23 ENCOUNTER — Encounter (INDEPENDENT_AMBULATORY_CARE_PROVIDER_SITE_OTHER): Payer: Self-pay | Admitting: Family Medicine

## 2022-05-23 ENCOUNTER — Encounter: Payer: Self-pay | Admitting: Internal Medicine

## 2022-05-23 ENCOUNTER — Other Ambulatory Visit (HOSPITAL_COMMUNITY): Payer: Self-pay

## 2022-05-23 VITALS — BP 128/82 | HR 83 | Temp 98.0°F | Ht 62.0 in | Wt 271.8 lb

## 2022-05-23 DIAGNOSIS — F3289 Other specified depressive episodes: Secondary | ICD-10-CM

## 2022-05-23 DIAGNOSIS — Z6841 Body Mass Index (BMI) 40.0 and over, adult: Secondary | ICD-10-CM | POA: Diagnosis not present

## 2022-05-23 DIAGNOSIS — E559 Vitamin D deficiency, unspecified: Secondary | ICD-10-CM | POA: Diagnosis not present

## 2022-05-23 MED ORDER — WEGOVY 1.7 MG/0.75ML ~~LOC~~ SOAJ
1.7000 mg | SUBCUTANEOUS | 0 refills | Status: DC
  Filled 2022-05-23: qty 3, 28d supply, fill #0

## 2022-05-23 MED ORDER — BUPROPION HCL ER (SR) 150 MG PO TB12
150.0000 mg | ORAL_TABLET | Freq: Two times a day (BID) | ORAL | 0 refills | Status: DC
  Filled 2022-05-23: qty 60, 30d supply, fill #0

## 2022-05-23 MED ORDER — VITAMIN D (ERGOCALCIFEROL) 1.25 MG (50000 UNIT) PO CAPS
50000.0000 [IU] | ORAL_CAPSULE | ORAL | 0 refills | Status: DC
  Filled 2022-05-23: qty 4, 28d supply, fill #0

## 2022-05-27 NOTE — Telephone Encounter (Signed)
Submitted PA w/ (Key: [id number]) Your information has been sent to [facility]...Donna Bond

## 2022-05-28 NOTE — Telephone Encounter (Signed)
Checking status on PA still pending../l;mb

## 2022-05-29 NOTE — Telephone Encounter (Signed)
Checking status on PA " [employer] is reviewing your PA request" still pending.Marland KitchenJohny Chess

## 2022-05-29 NOTE — Telephone Encounter (Signed)
Rec'd determination " The request has been approved. The authorization is effective from 05/29/2022 to 05/29/2023,". Inform pt w/ status.Marland KitchenChryl Heck

## 2022-06-03 DIAGNOSIS — Z6841 Body Mass Index (BMI) 40.0 and over, adult: Secondary | ICD-10-CM | POA: Insufficient documentation

## 2022-06-04 ENCOUNTER — Other Ambulatory Visit (HOSPITAL_COMMUNITY): Payer: Self-pay

## 2022-06-07 ENCOUNTER — Emergency Department (HOSPITAL_COMMUNITY): Payer: 59

## 2022-06-07 ENCOUNTER — Encounter (HOSPITAL_COMMUNITY): Payer: Self-pay

## 2022-06-07 ENCOUNTER — Other Ambulatory Visit: Payer: Self-pay

## 2022-06-07 ENCOUNTER — Emergency Department (HOSPITAL_COMMUNITY)
Admission: EM | Admit: 2022-06-07 | Discharge: 2022-06-08 | Disposition: A | Discharge status: Home / Self Care | Payer: 59 | Attending: Emergency Medicine | Admitting: Emergency Medicine

## 2022-06-07 DIAGNOSIS — M546 Pain in thoracic spine: Secondary | ICD-10-CM | POA: Diagnosis not present

## 2022-06-07 DIAGNOSIS — Y92410 Unspecified street and highway as the place of occurrence of the external cause: Secondary | ICD-10-CM | POA: Diagnosis not present

## 2022-06-07 DIAGNOSIS — R109 Unspecified abdominal pain: Secondary | ICD-10-CM | POA: Diagnosis not present

## 2022-06-07 DIAGNOSIS — M542 Cervicalgia: Secondary | ICD-10-CM | POA: Insufficient documentation

## 2022-06-07 DIAGNOSIS — R1084 Generalized abdominal pain: Secondary | ICD-10-CM | POA: Diagnosis not present

## 2022-06-07 DIAGNOSIS — R079 Chest pain, unspecified: Secondary | ICD-10-CM | POA: Diagnosis not present

## 2022-06-07 DIAGNOSIS — S3991XA Unspecified injury of abdomen, initial encounter: Secondary | ICD-10-CM | POA: Diagnosis not present

## 2022-06-07 DIAGNOSIS — K429 Umbilical hernia without obstruction or gangrene: Secondary | ICD-10-CM | POA: Diagnosis not present

## 2022-06-07 DIAGNOSIS — R1030 Lower abdominal pain, unspecified: Secondary | ICD-10-CM | POA: Diagnosis not present

## 2022-06-07 DIAGNOSIS — Z041 Encounter for examination and observation following transport accident: Secondary | ICD-10-CM | POA: Diagnosis not present

## 2022-06-07 DIAGNOSIS — S199XXA Unspecified injury of neck, initial encounter: Secondary | ICD-10-CM | POA: Diagnosis not present

## 2022-06-07 DIAGNOSIS — M47814 Spondylosis without myelopathy or radiculopathy, thoracic region: Secondary | ICD-10-CM | POA: Diagnosis not present

## 2022-06-07 DIAGNOSIS — R0789 Other chest pain: Secondary | ICD-10-CM | POA: Diagnosis not present

## 2022-06-07 MED ORDER — ACETAMINOPHEN 500 MG PO TABS
1000.0000 mg | ORAL_TABLET | Freq: Once | ORAL | Status: AC
  Administered 2022-06-07: 1000 mg via ORAL
  Filled 2022-06-07: qty 2
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
## 2022-06-07 NOTE — ED Notes (Signed)
Pt gone to CT 
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
## 2022-06-08 ENCOUNTER — Emergency Department (HOSPITAL_COMMUNITY): Payer: 59

## 2022-06-08 DIAGNOSIS — K429 Umbilical hernia without obstruction or gangrene: Secondary | ICD-10-CM | POA: Diagnosis not present

## 2022-06-08 DIAGNOSIS — S199XXA Unspecified injury of neck, initial encounter: Secondary | ICD-10-CM | POA: Diagnosis not present

## 2022-06-08 DIAGNOSIS — S3991XA Unspecified injury of abdomen, initial encounter: Secondary | ICD-10-CM | POA: Diagnosis not present

## 2022-06-08 LAB — BASIC METABOLIC PANEL
Anion gap: 10 (ref 5–15)
BUN: 13 mg/dL (ref 6–20)
CO2: 22 mmol/L (ref 22–32)
Calcium: 8.8 mg/dL — ABNORMAL LOW (ref 8.9–10.3)
Chloride: 104 mmol/L (ref 98–111)
Creatinine, Ser: 0.87 mg/dL (ref 0.44–1.00)
GFR, Estimated: >60 mL/min (ref >60)
Glucose, Bld: 100 mg/dL — ABNORMAL HIGH (ref 70–99)
Potassium: 4.1 mmol/L (ref 3.5–5.1)
Sodium: 136 mmol/L (ref 135–145)

## 2022-06-08 LAB — CBC WITH DIFFERENTIAL/PLATELET
Abs Immature Granulocytes: 0.19 10*3/uL — ABNORMAL HIGH (ref 0.00–0.07)
Basophils Absolute: 0.1 10*3/uL (ref 0.0–0.1)
Basophils Relative: 0 %
Eosinophils Absolute: 0.3 10*3/uL (ref 0.0–0.5)
Eosinophils Relative: 2 %
HCT: 36.3 % (ref 36.0–46.0)
Hemoglobin: 11.3 g/dL — ABNORMAL LOW (ref 12.0–15.0)
Immature Granulocytes: 1 %
Lymphocytes Relative: 25 %
Lymphs Abs: 3.9 10*3/uL (ref 0.7–4.0)
MCH: 22 pg — ABNORMAL LOW (ref 26.0–34.0)
MCHC: 31.1 g/dL (ref 30.0–36.0)
MCV: 70.6 fL — ABNORMAL LOW (ref 80.0–100.0)
Monocytes Absolute: 1.1 10*3/uL — ABNORMAL HIGH (ref 0.1–1.0)
Monocytes Relative: 7 %
Neutro Abs: 10.3 10*3/uL — ABNORMAL HIGH (ref 1.7–7.7)
Neutrophils Relative %: 65 %
Platelets: 304 10*3/uL (ref 150–400)
RBC: 5.14 MIL/uL — ABNORMAL HIGH (ref 3.87–5.11)
RDW: 14.7 % (ref 11.5–15.5)
WBC: 15.8 10*3/uL — ABNORMAL HIGH (ref 4.0–10.5)
nRBC: 0 % (ref 0.0–0.2)

## 2022-06-08 LAB — TROPONIN I (HIGH SENSITIVITY)
Troponin I (High Sensitivity): 5 ng/L (ref <18)
Troponin I (High Sensitivity): 4 ng/L (ref <18)

## 2022-06-08 LAB — LIPASE, BLOOD: Lipase: 40 U/L (ref 11–51)

## 2022-06-08 LAB — I-STAT BETA HCG BLOOD, ED (MC, WL, AP ONLY): I-stat hCG, quantitative: <5 m[IU]/mL (ref <5)

## 2022-06-08 MED ORDER — IOHEXOL 350 MG/ML SOLN
75.0000 mL | Freq: Once | INTRAVENOUS | Status: AC | PRN
  Administered 2022-06-08: 75 mL via INTRAVENOUS

## 2022-06-08 MED ORDER — OXYCODONE-ACETAMINOPHEN 5-325 MG PO TABS
1.0000 | ORAL_TABLET | Freq: Once | ORAL | Status: AC
  Administered 2022-06-08: 1 via ORAL
  Filled 2022-06-08: qty 1

## 2022-06-08 MED ORDER — FENTANYL CITRATE PF 50 MCG/ML IJ SOSY
50.0000 ug | PREFILLED_SYRINGE | Freq: Once | INTRAMUSCULAR | Status: AC
  Administered 2022-06-08: 50 ug via INTRAVENOUS
  Filled 2022-06-08: qty 1

## 2022-06-08 NOTE — ED Provider Notes (Signed)
Care transferred to me.  CTA is overall unrevealing.  I discussed with radiology.  A little bit of reflux of contrast in her vein but otherwise no dissection noted anywhere.  The area of interest in her neck is a small abrasion on the anterior aspect swelling seen by me.  At this point she appears stable for discharge with over-the-counter supportive care medicines, ice, etc.  Will give return precautions.   Sherwood Gambler, MD 06/08/22 [phone]

## 2022-06-08 NOTE — Discharge Instructions (Signed)
If you develop severe headache, neck pain or swelling, chest pain, abdominal pain, weakness or numbness, trouble breathing, or any other new/concerning symptoms then return to the ER or call 911.  Otherwise you may take ibuprofen, Tylenol, and apply ice to the affected areas.
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
## 2022-06-12 NOTE — Progress Notes (Signed)
Chief Complaint:   OBESITY Donna Bond is here to discuss her progress with her obesity treatment plan along with follow-up of her obesity related diagnoses. Donna Bond is on the Category 2 Plan with breakfast and lunch options and states she is following her eating plan approximately 65% of the time. Donna Bond states she is walking 20 minutes 1 times per week.  Today's visit was #: 53 Starting weight: 325 lbs Starting date: 06/08/2020 Today's weight: 271 LBS Today's date: 05/23/2022 Total lbs lost to date: 54 lbs Total lbs lost since last in-office visit: 3 LBS  Interim History: Patient feels she has abdominal cramping, constipation and at times a headache and has not just been feeling well on the higher dose of Wegovy even when eating on plan.  She desires for [facility] to be decreased today.  Subjective:   1. Vitamin D deficiency Vitamin D was 31.3 and not at goal.  Patient has not been taking her medication every week.  2. Other depression, with emotional eating Patient's mood is good and stable lately.  No issues.  Emotional eating is controlled overall no concerns.  Assessment/Plan:   Medications Discontinued During This Encounter  Medication Reason   Semaglutide-Weight Management (WEGOVY) 2.4 MG/0.75ML SOAJ Dose change   buPROPion (WELLBUTRIN SR) 150 MG 12 hr tablet Reorder   Vitamin D, Ergocalciferol, (DRISDOL) 1.25 MG (50000 UNIT) CAPS capsule Reorder     Meds ordered this encounter  Medications   DISCONTD: buPROPion (WELLBUTRIN SR) 150 MG 12 hr tablet    Sig: Take 1 tablet (150 mg total) by mouth 2 (two) times daily.    Dispense:  60 tablet    Refill:  0    30 d supply; ov for rf   DISCONTD: Vitamin D, Ergocalciferol, (DRISDOL) 1.25 MG (50000 UNIT) CAPS capsule    Sig: Take 1 capsule (50,000 Units total) by mouth every 7 (seven) days.    Dispense:  4 capsule    Refill:  0    Needs ov for RF; 60 d supply   DISCONTD: Semaglutide-Weight Management (WEGOVY) 1.7 MG/0.75ML SOAJ    Sig:  Inject 1.7 mg into the skin once a week.    Dispense:  3 mL    Refill:  0     1. Vitamin D deficiency After weekly use recheck and adjust dose to goal of 50+ as needed.0  Refill- Vitamin D, Ergocalciferol, (DRISDOL) 1.25 MG (50000 UNIT) CAPS capsule; Take 1 capsule (50,000 Units total) by mouth every 7 (seven) days.  Dispense: 4 capsule; Refill: 0  2. Other depression, with emotional eating Continue PNP but start exercise at least 3 days per week.  Refill- buPROPion (WELLBUTRIN SR) 150 MG 12 hr tablet; Take 1 tablet (150 mg total) by mouth 2 (two) times daily.  Dispense: 60 tablet; Refill: 0  3. BMI 45.0-49.9, adult (HCC)-current bmi 49.7  4. Morbid obesity (HCC)-start bmi 59.44 Decrease Wegovy from 2.4 to 1.7 weekly due to GI side effects and headaches and good.  Continue PNP and weight loss.   Decrease- Semaglutide-Weight Management (WEGOVY) 1.7 MG/0.75ML SOAJ; Inject 1.7 mg into the skin once a week.  Dispense: 3 mL; Refill: 0  Donna Bond is currently in the action stage of change. As such, her goal is to continue with weight loss efforts. She has agreed to the Category 2 Plan with breakfast and lunch options.    Exercise goals:  Goal to start exercise/walk 30 minutes 3 days.    Behavioral modification strategies: decreasing eating  out and ways to avoid boredom eating.  [name] has agreed to follow-up with our clinic in 3-4 weeks. She was informed of the importance of frequent follow-up visits to maximize her success with intensive lifestyle modifications for her multiple health conditions.   Objective:   Blood pressure 128/82, pulse 83, temperature 98 F (36.7 C), height '5\' 2"'$  (1.575 m), weight 271 lb 12.8 oz (123.3 kg), SpO2 97 %. Body mass index is 49.71 kg/m.  General: Cooperative, alert, well developed, in no acute distress. HEENT: Conjunctivae and lids unremarkable. Cardiovascular: Regular rhythm.  Lungs: Normal work of breathing. Neurologic: No focal deficits.   Lab Results   Component Value Date   CREATININE 0.87 06/07/2022   BUN 13 06/07/2022   NA 136 06/07/2022   K 4.1 06/07/2022   CL 104 06/07/2022   CO2 22 06/07/2022   Lab Results  Component Value Date   ALT 14 03/25/2022   AST 14 03/25/2022   ALKPHOS 119 03/25/2022   BILITOT 0.5 03/25/2022   Lab Results  Component Value Date   HGBA1C 5.5 03/25/2022   HGBA1C 5.4 10/09/2021   HGBA1C 5.6 07/09/2021   HGBA1C 5.7 (H) 03/05/2021   HGBA1C 6.1 10/31/2020   Lab Results  Component Value Date   INSULIN 6.1 03/25/2022   INSULIN 9.7 10/09/2021   INSULIN 15.5 07/09/2021   INSULIN 18.2 06/08/2020   Lab Results  Component Value Date   TSH 1.180 07/09/2021   Lab Results  Component Value Date   CHOL 111 10/09/2021   HDL 40 10/09/2021   LDLCALC 59 10/09/2021   TRIG 48 10/09/2021   CHOLHDL 2.8 10/09/2021   Lab Results  Component Value Date   VD25OH 31.3 03/25/2022   VD25OH 43.4 10/09/2021   VD25OH 80.6 07/09/2021   Lab Results  Component Value Date   WBC 15.8 (H) 06/08/2022   HGB 11.3 (L) 06/08/2022   HCT 36.3 06/08/2022   MCV 70.6 (L) 06/08/2022   PLT 304 06/08/2022   Lab Results  Component Value Date   IRON 36 10/09/2021   TIBC 325 10/09/2021   FERRITIN 145 10/09/2021   Attestation Statements:   Reviewed by clinician on day of visit: allergies, medications, problem list, medical history, surgical history, family history, social history, and previous encounter notes.  I, Davy Pique, RMA, am acting as Location manager for [employer], DO.   I have reviewed the above documentation for completeness, and I agree with the above. Marjory Sneddon, D.O.  The [place] was signed into law in 2016 which includes the topic of electronic health records.  This provides immediate access to information in MyChart.  This includes consultation notes, operative notes, office notes, lab results and pathology reports.  If you have any questions about what you read please let us  know at your next visit so we can discuss your concerns and take corrective action if need be.  We are right here with you.

## 2022-06-13 ENCOUNTER — Encounter: Payer: Self-pay | Admitting: Internal Medicine

## 2022-06-20 ENCOUNTER — Ambulatory Visit (INDEPENDENT_AMBULATORY_CARE_PROVIDER_SITE_OTHER): Payer: 59 | Admitting: Family Medicine

## 2022-06-25 ENCOUNTER — Other Ambulatory Visit (HOSPITAL_COMMUNITY): Payer: Self-pay

## 2022-06-25 ENCOUNTER — Ambulatory Visit (INDEPENDENT_AMBULATORY_CARE_PROVIDER_SITE_OTHER): Payer: 59 | Admitting: Family Medicine

## 2022-06-25 ENCOUNTER — Encounter (INDEPENDENT_AMBULATORY_CARE_PROVIDER_SITE_OTHER): Payer: Self-pay | Admitting: Family Medicine

## 2022-06-25 VITALS — BP 135/84 | HR 90 | Temp 98.0°F | Ht 62.0 in | Wt 277.0 lb

## 2022-06-25 DIAGNOSIS — Z6841 Body Mass Index (BMI) 40.0 and over, adult: Secondary | ICD-10-CM | POA: Insufficient documentation

## 2022-06-25 DIAGNOSIS — F3289 Other specified depressive episodes: Secondary | ICD-10-CM

## 2022-06-25 DIAGNOSIS — R7303 Prediabetes: Secondary | ICD-10-CM

## 2022-06-25 DIAGNOSIS — E559 Vitamin D deficiency, unspecified: Secondary | ICD-10-CM | POA: Diagnosis not present

## 2022-06-25 DIAGNOSIS — F39 Unspecified mood [affective] disorder: Secondary | ICD-10-CM

## 2022-06-25 MED ORDER — BUPROPION HCL ER (SR) 150 MG PO TB12
150.0000 mg | ORAL_TABLET | Freq: Two times a day (BID) | ORAL | 0 refills | Status: DC
  Filled 2022-06-25: qty 60, 30d supply, fill #0

## 2022-06-25 MED ORDER — VITAMIN D (ERGOCALCIFEROL) 1.25 MG (50000 UNIT) PO CAPS
50000.0000 [IU] | ORAL_CAPSULE | ORAL | 0 refills | Status: DC
  Filled 2022-06-25: qty 4, 28d supply, fill #0

## 2022-06-25 MED ORDER — SERTRALINE HCL 50 MG PO TABS
50.0000 mg | ORAL_TABLET | Freq: Every day | ORAL | 0 refills | Status: DC
  Filled 2022-06-25: qty 30, 30d supply, fill #0
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
## 2022-06-26 ENCOUNTER — Other Ambulatory Visit (HOSPITAL_COMMUNITY): Payer: Self-pay

## 2022-07-17 ENCOUNTER — Ambulatory Visit (INDEPENDENT_AMBULATORY_CARE_PROVIDER_SITE_OTHER): Payer: 59 | Admitting: Family Medicine

## 2022-07-17 ENCOUNTER — Other Ambulatory Visit (HOSPITAL_COMMUNITY): Payer: Self-pay

## 2022-07-17 ENCOUNTER — Encounter (INDEPENDENT_AMBULATORY_CARE_PROVIDER_SITE_OTHER): Payer: Self-pay | Admitting: Family Medicine

## 2022-07-17 VITALS — BP 128/82 | HR 74 | Temp 98.3°F | Ht 62.0 in | Wt 277.2 lb

## 2022-07-17 DIAGNOSIS — E559 Vitamin D deficiency, unspecified: Secondary | ICD-10-CM

## 2022-07-17 DIAGNOSIS — Z6841 Body Mass Index (BMI) 40.0 and over, adult: Secondary | ICD-10-CM | POA: Diagnosis not present

## 2022-07-17 DIAGNOSIS — F3289 Other specified depressive episodes: Secondary | ICD-10-CM

## 2022-07-17 DIAGNOSIS — R7303 Prediabetes: Secondary | ICD-10-CM

## 2022-07-17 MED ORDER — VITAMIN D (ERGOCALCIFEROL) 1.25 MG (50000 UNIT) PO CAPS
50000.0000 [IU] | ORAL_CAPSULE | ORAL | 0 refills | Status: DC
  Filled 2022-07-17 – 2022-08-06 (×2): qty 12, 84d supply, fill #0

## 2022-07-17 MED ORDER — SERTRALINE HCL 50 MG PO TABS
50.0000 mg | ORAL_TABLET | Freq: Every day | ORAL | 0 refills | Status: DC
  Filled 2022-07-17 – 2022-08-06 (×2): qty 90, 90d supply, fill #0

## 2022-07-17 MED ORDER — BUPROPION HCL ER (SR) 150 MG PO TB12
150.0000 mg | ORAL_TABLET | Freq: Two times a day (BID) | ORAL | 0 refills | Status: DC
  Filled 2022-07-17 – 2022-08-06 (×2): qty 180, 90d supply, fill #0
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
## 2022-07-19 ENCOUNTER — Other Ambulatory Visit (HOSPITAL_COMMUNITY): Payer: Self-pay

## 2022-08-01 ENCOUNTER — Other Ambulatory Visit (HOSPITAL_COMMUNITY): Payer: Self-pay

## 2022-08-06 ENCOUNTER — Other Ambulatory Visit: Payer: Self-pay

## 2022-08-06 ENCOUNTER — Other Ambulatory Visit (HOSPITAL_COMMUNITY): Payer: Self-pay

## 2022-08-22 DIAGNOSIS — F411 Generalized anxiety disorder: Secondary | ICD-10-CM | POA: Diagnosis not present

## 2022-08-27 DIAGNOSIS — F411 Generalized anxiety disorder: Secondary | ICD-10-CM | POA: Diagnosis not present

## 2022-08-28 ENCOUNTER — Ambulatory Visit (INDEPENDENT_AMBULATORY_CARE_PROVIDER_SITE_OTHER): Payer: 59 | Admitting: Family Medicine

## 2022-08-28 ENCOUNTER — Encounter (INDEPENDENT_AMBULATORY_CARE_PROVIDER_SITE_OTHER): Payer: Self-pay | Admitting: Family Medicine

## 2022-08-28 VITALS — BP 136/83 | HR 81 | Temp 98.3°F | Ht 62.0 in | Wt 282.8 lb

## 2022-08-28 DIAGNOSIS — Z6841 Body Mass Index (BMI) 40.0 and over, adult: Secondary | ICD-10-CM | POA: Diagnosis not present

## 2022-08-28 DIAGNOSIS — F3289 Other specified depressive episodes: Secondary | ICD-10-CM | POA: Diagnosis not present

## 2022-08-28 DIAGNOSIS — R7303 Prediabetes: Secondary | ICD-10-CM

## 2022-08-28 DIAGNOSIS — F39 Unspecified mood [affective] disorder: Secondary | ICD-10-CM

## 2022-08-28 DIAGNOSIS — E559 Vitamin D deficiency, unspecified: Secondary | ICD-10-CM | POA: Diagnosis not present

## 2022-08-28 NOTE — Progress Notes (Signed)
[name], D.O.  ABFM, ABOM Specializing in Clinical Bariatric Medicine  Office located at: [address] [place], [place]  [address]     Assessment and Plan:   Orders Placed This Encounter  Procedures   VITAMIN D 25 Hydroxy (Vit-D Deficiency, Fractures)   Hemoglobin A1c    Patient will come in for labs (Vitamin D and A1c) prior to next OV   Mood disorder (HCC) with emotional eating Assessment: Condition is improving, but not optimized. - Patient endorses that her anxiety is under better control. Has been crying less.  - Recently got a therapist and life coach for management of her stress and anxiety. She will be seeing them regularly for the next 8 weeks. - No issues with Zoloft 50 mg daily and Wellbutrin 150 mg BID. She has been feeling less sleepy on the Zoloft.  Plan: - Continue meeting with therapist/life coach. Continue with meds.   - Reminded patient of the importance of following their prudent nutrition plan and how food can affect mood as well to support emotional wellbeing.  - We will continue to monitor closely alongside PCP / other specialists.    Prediabetes Assessment: Condition is improving, but not optimized. Lab Results  Component Value Date   HGBA1C 5.5 03/25/2022   HGBA1C 5.4 10/09/2021   HGBA1C 5.6 07/09/2021   INSULIN 6.1 03/25/2022   INSULIN 9.7 10/09/2021   INSULIN 15.5 07/09/2021  - Patient not on any prediabetic medication. This is diet controlled.  - Hunger and cravings are well controlled when eating on plan.   Plan: - Continue to decrease simple carbs/ sugars; increase fiber and proteins -> follow her meal plan.   - Donna Bond will continue to work on weight loss, exercise, via their meal plan we devised to help decrease the risk of progressing to diabetes.    Vitamin D deficiency Assessment: Condition is not optimized.  Lab Results  Component Value Date   VD25OH 31.3 03/25/2022   VD25OH 43.4 10/09/2021   VD25OH 80.6  07/09/2021  - Endorses that she does forgot to take her Ergocalciferol 50K IU weekly sometimes.   Plan: - Continue with med. Pt denies need for refill. I highly encouraged patient to take her Vitamin D regularly. - Continue her prudent nutritional plan that is low in simple carbohydrates, saturated fats and trans fats to goal of 5-10% weight loss to achieve significant health benefits.  Pt encouraged to continually advance exercise and cardiovascular fitness as tolerated throughout weight loss journey.   TREATMENT PLAN FOR OBESITY: BMI 50.0-59.9, adult (HCC)-current bmi 51.71 Morbid obesity (HCC)-start bmi 59.44/date 05/1720 Assessment: Condition is not optimized.  Fat mass has increased by 5 lb. Muscle mass has increased by 0.6 lb. Total body water has increased by 0.8 lb.   Plan:  Donna Bond is currently in the action stage of change. As such, her goal is to continue weight management plan. Donna Bond will work on healthier eating habits and continue with the Category 2 meal plan and begin journaling 1100-1200 calories and 85+ grams of protein daily.   Behavioral Intervention Additional resources provided today: patient declined Evidence-based interventions for health behavior change were utilized today including the discussion of self monitoring techniques, problem-solving barriers and SMART goal setting techniques.   Regarding patient's less desirable eating habits and patterns, we employed the technique of small changes.  Pt will specifically work on: begin journaling intake and bring her notebook for next visit.    Recommended Physical Activity Goals Donna Bond  has been advised to work up to 150 minutes of moderate intensity aerobic activity a week and strengthening exercises 2-3 times per week for cardiovascular health, weight loss maintenance and preservation of muscle mass.  She has agreed to Continue current level of physical activity   FOLLOW UP: Return in about 6 weeks (around 10/09/2022). She was  informed of the importance of frequent follow up visits to maximize her success with intensive lifestyle modifications for her multiple health conditions.  Subjective:   Chief complaint: Obesity Donna Bond is here to discuss her progress with her obesity treatment plan. She is on the the Category 2 Plan with breakfast and lunch options and states she is following her eating plan approximately 30% of the time. She states she is walking 30 minutes 2 days per week.  Interval History:  Donna Bond is here for a follow up office visit.  Since last office visit she: - Recently got a therapist/life coach for management of her stress and anxiety.  - Recently purchased a used vehicle and is now able to go to the grocery store.  - She did not journal her food intake because of her busy personal and work life.   Review of Systems:  Pertinent positives were addressed with patient today.  Weight Summary and Biometrics   Weight Lost Since Last Visit: 0  Weight Gained Since Last Visit: 5 lb    Vitals Temp: 98.3 F (36.8 C) BP: 136/83 Pulse Rate: 81 SpO2: 97 %   Anthropometric Measurements Height: 5\' 2"  (1.575 m) Weight: 282 lb 12.8 oz (128.3 kg) BMI (Calculated): 51.71 Weight at Last Visit: 277 lb Weight Lost Since Last Visit: 0 Weight Gained Since Last Visit: 5 lb Starting Weight: 325 lb Total Weight Loss (lbs): 49 lb (22.2 kg) Peak Weight: 325 lb   Body Composition  Body Fat %: 53.8 % Fat Mass (lbs): 152.2 lbs Muscle Mass (lbs): 124 lbs Total Body Water (lbs): 95.8 lbs Visceral Fat Rating : 19   Other Clinical Data Fasting: No Labs: No Today's Visit #: 35 Starting Date: 06/08/20    Objective:   PHYSICAL EXAM: Blood pressure 136/83, pulse 81, temperature 98.3 F (36.8 C), height 5\' 2"  (1.575 m), weight 282 lb 12.8 oz (128.3 kg), SpO2 97 %. Body mass index is 51.72 kg/m.  General: Well Developed, well nourished, and in no acute distress.  HEENT: Normocephalic,  atraumatic Skin: Warm and dry, cap RF less 2 sec, good turgor Chest:  Normal excursion, shape, no gross abn Respiratory: speaking in full sentences, no conversational dyspnea NeuroM-Sk: Ambulates w/o assistance, moves * 4 Psych: A and O *3, insight good, mood-full  DIAGNOSTIC DATA REVIEWED:  BMET    Component Value Date/Time   NA 136 06/07/2022 2350   NA 139 03/25/2022 1015   K 4.1 06/07/2022 2350   CL 104 06/07/2022 2350   CO2 22 06/07/2022 2350   GLUCOSE 100 (H) 06/07/2022 2350   BUN 13 06/07/2022 2350   BUN 15 03/25/2022 1015   CREATININE 0.87 06/07/2022 2350   CALCIUM 8.8 (L) 06/07/2022 2350   GFRNONAA >60 06/07/2022 2350   GFRAA 114 06/08/2020 1015   Lab Results  Component Value Date   HGBA1C 5.5 03/25/2022   HGBA1C 5.8 06/10/2017   Lab Results  Component Value Date   INSULIN 6.1 03/25/2022   INSULIN 18.2 06/08/2020   Lab Results  Component Value Date   TSH 1.180 07/09/2021   CBC    Component Value Date/Time   WBC  15.8 (H) 06/08/2022 0120   RBC 5.14 (H) 06/08/2022 0120   HGB 11.3 (L) 06/08/2022 0120   HGB 11.6 10/09/2021 0828   HCT 36.3 06/08/2022 0120   HCT 37.9 10/09/2021 0828   PLT 304 06/08/2022 0120   PLT 332 10/09/2021 0828   MCV 70.6 (L) 06/08/2022 0120   MCV 72 (L) 10/09/2021 0828   MCH 22.0 (L) 06/08/2022 0120   MCHC 31.1 06/08/2022 0120   RDW 14.7 06/08/2022 0120   RDW 16.5 (H) 10/09/2021 0828   Iron Studies    Component Value Date/Time   IRON 36 10/09/2021 0828   TIBC 325 10/09/2021 0828   FERRITIN 145 10/09/2021 0828   IRONPCTSAT 11 (L) 10/09/2021 0828   Lipid Panel     Component Value Date/Time   CHOL 111 10/09/2021 0828   TRIG 48 10/09/2021 0828   HDL 40 10/09/2021 0828   CHOLHDL 2.8 10/09/2021 0828   LDLCALC 59 10/09/2021 0828   Hepatic Function Panel     Component Value Date/Time   PROT 7.6 03/25/2022 1015   ALBUMIN 4.1 03/25/2022 1015   AST 14 03/25/2022 1015   ALT 14 03/25/2022 1015   ALKPHOS 119 03/25/2022 1015    BILITOT 0.5 03/25/2022 1015      Component Value Date/Time   TSH 1.180 07/09/2021 0906   Nutritional Lab Results  Component Value Date   VD25OH 31.3 03/25/2022   VD25OH 43.4 10/09/2021   VD25OH 80.6 07/09/2021    Attestations:   Reviewed by clinician on day of visit: allergies, medications, problem list, medical history, surgical history, family history, social history, and previous encounter notes.   I,Special Puri,acting as a Neurosurgeon for [employer], DO.,have documented all relevant documentation on the behalf of Thomasene Lot, DO,as directed by  Thomasene Lot, DO while in the presence of Thomasene Lot, DO.   I, Thomasene Lot, DO, have reviewed all documentation for this visit. The documentation on 08/28/22 for the exam, diagnosis, procedures, and orders are all accurate and complete.

## 2022-09-03 DIAGNOSIS — F411 Generalized anxiety disorder: Secondary | ICD-10-CM | POA: Diagnosis not present

## 2022-09-17 DIAGNOSIS — F411 Generalized anxiety disorder: Secondary | ICD-10-CM | POA: Diagnosis not present

## 2022-09-24 DIAGNOSIS — F411 Generalized anxiety disorder: Secondary | ICD-10-CM | POA: Diagnosis not present

## 2022-10-01 DIAGNOSIS — F411 Generalized anxiety disorder: Secondary | ICD-10-CM | POA: Diagnosis not present

## 2022-10-10 ENCOUNTER — Ambulatory Visit (INDEPENDENT_AMBULATORY_CARE_PROVIDER_SITE_OTHER): Payer: 59 | Admitting: Family Medicine

## 2022-10-15 DIAGNOSIS — F411 Generalized anxiety disorder: Secondary | ICD-10-CM | POA: Diagnosis not present

## 2022-10-22 DIAGNOSIS — F411 Generalized anxiety disorder: Secondary | ICD-10-CM | POA: Diagnosis not present

## 2022-10-31 DIAGNOSIS — F411 Generalized anxiety disorder: Secondary | ICD-10-CM | POA: Diagnosis not present

## 2022-11-07 ENCOUNTER — Encounter (INDEPENDENT_AMBULATORY_CARE_PROVIDER_SITE_OTHER): Payer: Self-pay | Admitting: Family Medicine

## 2022-11-07 ENCOUNTER — Ambulatory Visit (INDEPENDENT_AMBULATORY_CARE_PROVIDER_SITE_OTHER): Payer: 59 | Admitting: Family Medicine

## 2022-11-07 ENCOUNTER — Other Ambulatory Visit (HOSPITAL_COMMUNITY): Payer: Self-pay

## 2022-11-07 VITALS — BP 112/84 | HR 85 | Temp 98.2°F | Ht 62.0 in | Wt 294.0 lb

## 2022-11-07 DIAGNOSIS — G44009 Cluster headache syndrome, unspecified, not intractable: Secondary | ICD-10-CM

## 2022-11-07 DIAGNOSIS — F3289 Other specified depressive episodes: Secondary | ICD-10-CM

## 2022-11-07 DIAGNOSIS — Z6841 Body Mass Index (BMI) 40.0 and over, adult: Secondary | ICD-10-CM

## 2022-11-07 DIAGNOSIS — R7303 Prediabetes: Secondary | ICD-10-CM | POA: Diagnosis not present

## 2022-11-07 DIAGNOSIS — R519 Headache, unspecified: Secondary | ICD-10-CM | POA: Diagnosis not present

## 2022-11-07 DIAGNOSIS — F39 Unspecified mood [affective] disorder: Secondary | ICD-10-CM

## 2022-11-07 DIAGNOSIS — E559 Vitamin D deficiency, unspecified: Secondary | ICD-10-CM

## 2022-11-07 MED ORDER — TOPIRAMATE ER 50 MG PO CAP24
ORAL_CAPSULE | ORAL | 0 refills | Status: DC
  Filled 2022-11-07: qty 30, 30d supply, fill #0

## 2022-11-07 MED ORDER — VITAMIN D (ERGOCALCIFEROL) 1.25 MG (50000 UNIT) PO CAPS
50000.0000 [IU] | ORAL_CAPSULE | ORAL | 0 refills | Status: DC
  Filled 2022-11-07: qty 12, 84d supply, fill #0

## 2022-11-07 MED ORDER — SERTRALINE HCL 50 MG PO TABS
50.0000 mg | ORAL_TABLET | Freq: Every day | ORAL | 0 refills | Status: DC
  Filled 2022-11-07: qty 90, 90d supply, fill #0

## 2022-11-07 MED ORDER — BUPROPION HCL ER (SR) 150 MG PO TB12
150.0000 mg | ORAL_TABLET | Freq: Two times a day (BID) | ORAL | 0 refills | Status: DC
  Filled 2022-11-07: qty 180, 90d supply, fill #0

## 2022-11-07 NOTE — Progress Notes (Signed)
[name], D.O.  ABFM, ABOM Specializing in Clinical Bariatric Medicine  Office located at: [address] [place], [place]  [address]     Assessment and Plan:   Medications Discontinued During This Encounter  Medication Reason   buPROPion (WELLBUTRIN SR) 150 MG 12 hr tablet Reorder   sertraline (ZOLOFT) 50 MG tablet Reorder   Vitamin D, Ergocalciferol, (DRISDOL) 1.25 MG (50000 UNIT) CAPS capsule Reorder     Meds ordered this encounter  Medications   buPROPion (WELLBUTRIN SR) 150 MG 12 hr tablet    Sig: Take 1 tablet (150 mg total) by mouth 2 (two) times daily.    Dispense:  180 tablet    Refill:  0    90 d supply; ov for rf   sertraline (ZOLOFT) 50 MG tablet    Sig: Take 1 tablet (50 mg total) by mouth daily.    Dispense:  90 tablet    Refill:  0    Ov for rf   Vitamin D, Ergocalciferol, (DRISDOL) 1.25 MG (50000 UNIT) CAPS capsule    Sig: Take 1 capsule (50,000 Units total) by mouth every 7 (seven) days.    Dispense:  12 capsule    Refill:  0    Needs ov for RF   Topiramate ER (TROKENDI XR) 50 MG CP24    Sig: 1 po qd    Dispense:  30 capsule    Refill:  0    Check Vitamin D, A1c, and possibly other labs next OV   Other depression, with emotional eating Assessment: Denies any SI/HI. Mood is stable. Endorses doing less emotional eating since last OV. Recently completed the [facility] 8 week well ness program through [facility]. Reports increasing her Zoloft from 25 mg to 50 mg daily on her own approximately 1 month ago. She notes that her anxiety is better controlled. She is also compliant and tolerant with Wellbutrin SR 150 mg two time daily.   Plan: Pt advised to continue with Zoloft and Wellbutrin at current doses. Will refill both medications today. Reminded patient of the importance of following their prudent nutrition plan and how food can affect mood as well to support emotional wellbeing. We will continue to monitor closely.   Vitamin D  deficiency Assessment: This condition is being treated with Ergocalciferol 50K IU weekly and she endorses taking the prescribed supplement sporadically.   Lab Results  Component Value Date   VD25OH 31.3 03/25/2022   VD25OH 43.4 10/09/2021   VD25OH 80.6 07/09/2021   Plan: Pt advised to maintain with ERGO at current dose. I encouraged her to take the supplement more consistently to improve these values. Will refill ERGO today. Weight loss will likely improve availability of vitamin D, thus encouraged Donna Bond to continue with meal plan and their weight loss efforts to further improve this condition.  Thus, we will need to monitor levels regularly (every 3-4 mo on average) to keep levels within normal limits and prevent over supplementation.   Prediabetes Generalized headaches Assessment: Condition is currently diet/exercise controlled. Reports having hunger and cravings, especially when eating off plan foods and not eating adequate amounts of protein. Pt also reports having stress-induced headaches 2 times a wk; she takes Tylenol which alleviates her symptoms.   Lab Results  Component Value Date   HGBA1C 5.5 03/25/2022   HGBA1C 5.4 10/09/2021   HGBA1C 5.6 07/09/2021   INSULIN 6.1 03/25/2022   INSULIN 9.7 10/09/2021   INSULIN 15.5 07/09/2021    Plan: Begin  Topamax. Discussed anticipated benefits/possible risks of starting medication. The patient was given the opportunity to ask questions.  All questions answered to their satisfaction. The patient is in agreement to this plan. Donna Bond will continue to work on weight loss, exercise, via their meal plan we devised to help decrease the risk of progressing to diabetes. We will recheck A1c and fasting insulin level as deemed appropriate.     TREATMENT PLAN FOR OBESITY: BMI 50.0-59.9, adult (HCC)-current bmi 53.76 Morbid obesity (HCC)-start bmi 59.44/date 05/1720 Assessment: Donna Bond is here to discuss her progress with her obesity treatment plan along  with follow-up of her obesity related diagnoses. See Medical Weight Management Flowsheet for complete bioelectrical impedance results.  Condition is not optimized. Biometric data collected today, was reviewed with patient.   Since last office visit on 08/28/22 patient's  Muscle mass has increased by 3 lb. Fat mass has increased by 10 lb. Total body water has increased by 0.8 lb.  Counseling done on how various foods will affect these numbers and how to maximize success  Total lbs lost to date: - 31 lbs  Total weight loss percentage to date: 9.54%   Plan: I recommended pt to look into the [employer], which is a good tool for locating nearby food pantries. I also encouraged her to purchase foods like frozen chicken breast, frozen veggies, and canned tuna, which are relatively economical food options. Continue with the Category 2 meal plan and begin journaling 1100-1200 calories and 85+ grams of protein daily.   Behavioral Intervention Additional resources provided today: patient declined Evidence-based interventions for health behavior change were utilized today including the discussion of self monitoring techniques, problem-solving barriers and SMART goal setting techniques.   Regarding patient's less desirable eating habits and patterns, we employed the technique of small changes.  Pt will specifically work on: meal prepping/planning once a wk and continue with exercise regiment for next visit.    Recommended Physical Activity Goals  Donna Bond has been advised to slowly work up to 150 minutes of moderate intensity aerobic activity a week and strengthening exercises 2-3 times per week for cardiovascular health, weight loss maintenance and preservation of muscle mass.   She has agreed to Continue current level of physical activity   FOLLOW UP: Return in about 4 weeks (around 12/05/2022). She was informed of the importance of frequent follow up visits to maximize her success with intensive  lifestyle modifications for her multiple health conditions.  Subjective:   Chief complaint: Obesity Donna Bond is here to discuss her progress with her obesity treatment plan. She is on  the Category 2 Plan and keeping a food journal and adhering to recommended goals of 1100-1200 calories and 85+ protein and states she is following her eating plan approximately 0% of the time. She states she is walking 20 minutes 2 days per week.  Interval History:  Donna Bond is here for a follow up office visit. Since last OV, Donna Bond has been doing okay. Endorses that it has been financially difficult to buy the foods on her meal plan. Reports eating more off plan foods (ex. pizza and burgers) lately, but expresses interest in getting back on track.  Her hunger and cravings increase when eating off plan.   Pharmacotherapy for weight loss: She is currently taking  Wellbutrin   for medical weight loss.  Denies side effects.    Review of Systems:  Pertinent positives were addressed with patient today.  Reviewed by clinician on day of visit: allergies,  medications, problem list, medical history, surgical history, family history, social history, and previous encounter notes.  Weight Summary and Biometrics   Weight Lost Since Last Visit: 0lb  Weight Gained Since Last Visit: 12lb   Vitals Temp: 98.2 F (36.8 C) BP: 112/84 Pulse Rate: 85 SpO2: 97 %   Anthropometric Measurements Height: 5\' 2"  (1.575 m) Weight: 294 lb (133.4 kg) BMI (Calculated): 53.76 Weight at Last Visit: 282lb Weight Lost Since Last Visit: 0lb Weight Gained Since Last Visit: 12lb Starting Weight: 325 Total Weight Loss (lbs): 31 lb (14.1 kg) Peak Weight: 325   Body Composition  Body Fat %: 54.5 % Fat Mass (lbs): 160.2 lbs Muscle Mass (lbs): 127 lbs Total Body Water (lbs): 98.6 lbs Visceral Fat Rating : 20   Other Clinical Data Fasting: no Labs: no Today's Visit #: 36 Starting Date: 06/08/20   Objective:   PHYSICAL  EXAM: Blood pressure 112/84, pulse 85, temperature 98.2 F (36.8 C), height 5\' 2"  (1.575 m), weight 294 lb (133.4 kg), SpO2 97%. Body mass index is 53.77 kg/m.  General: Well Developed, well nourished, and in no acute distress.  HEENT: Normocephalic, atraumatic Skin: Warm and dry, cap RF less 2 sec, good turgor Chest:  Normal excursion, shape, no gross abn Respiratory: speaking in full sentences, no conversational dyspnea NeuroM-Sk: Ambulates w/o assistance, moves * 4 Psych: A and O *3, insight good, mood-full  DIAGNOSTIC DATA REVIEWED:  BMET    Component Value Date/Time   NA 136 06/07/2022 2350   NA 139 03/25/2022 1015   K 4.1 06/07/2022 2350   CL 104 06/07/2022 2350   CO2 22 06/07/2022 2350   GLUCOSE 100 (H) 06/07/2022 2350   BUN 13 06/07/2022 2350   BUN 15 03/25/2022 1015   CREATININE 0.87 06/07/2022 2350   CALCIUM 8.8 (L) 06/07/2022 2350   GFRNONAA >60 06/07/2022 2350   GFRAA 114 06/08/2020 1015   Lab Results  Component Value Date   HGBA1C 5.5 03/25/2022   HGBA1C 5.8 06/10/2017   Lab Results  Component Value Date   INSULIN 6.1 03/25/2022   INSULIN 18.2 06/08/2020   Lab Results  Component Value Date   TSH 1.180 07/09/2021   CBC    Component Value Date/Time   WBC 15.8 (H) 06/08/2022 0120   RBC 5.14 (H) 06/08/2022 0120   HGB 11.3 (L) 06/08/2022 0120   HGB 11.6 10/09/2021 0828   HCT 36.3 06/08/2022 0120   HCT 37.9 10/09/2021 0828   PLT 304 06/08/2022 0120   PLT 332 10/09/2021 0828   MCV 70.6 (L) 06/08/2022 0120   MCV 72 (L) 10/09/2021 0828   MCH 22.0 (L) 06/08/2022 0120   MCHC 31.1 06/08/2022 0120   RDW 14.7 06/08/2022 0120   RDW 16.5 (H) 10/09/2021 0828   Iron Studies    Component Value Date/Time   IRON 36 10/09/2021 0828   TIBC 325 10/09/2021 0828   FERRITIN 145 10/09/2021 0828   IRONPCTSAT 11 (L) 10/09/2021 0828   Lipid Panel     Component Value Date/Time   CHOL 111 10/09/2021 0828   TRIG 48 10/09/2021 0828   HDL 40 10/09/2021 0828    CHOLHDL 2.8 10/09/2021 0828   LDLCALC 59 10/09/2021 0828   Hepatic Function Panel     Component Value Date/Time   PROT 7.6 03/25/2022 1015   ALBUMIN 4.1 03/25/2022 1015   AST 14 03/25/2022 1015   ALT 14 03/25/2022 1015   ALKPHOS 119 03/25/2022 1015   BILITOT 0.5 03/25/2022 1015  Component Value Date/Time   TSH 1.180 07/09/2021 0906   Nutritional Lab Results  Component Value Date   VD25OH 31.3 03/25/2022   VD25OH 43.4 10/09/2021   VD25OH 80.6 07/09/2021    Attestations:   I, Special Puri , acting as a Stage manager for [employer], DO., have compiled all relevant documentation for today's office visit on behalf of Donna Lot, DO, while in the presence of [employer], DO.  I have reviewed the above documentation for accuracy and completeness, and I agree with the above. [name], D.O.  The 21st Century Cures Act was signed into law in 2016 which includes the topic of electronic health records.  This provides immediate access to information in MyChart.  This includes consultation notes, operative notes, office notes, lab results and pathology reports.  If you have any questions about what you read please let us know at your next visit so we can discuss your concerns and take corrective action if need be.  We are right here with you.

## 2022-11-11 ENCOUNTER — Other Ambulatory Visit (HOSPITAL_COMMUNITY): Payer: Self-pay

## 2022-11-12 ENCOUNTER — Other Ambulatory Visit (HOSPITAL_COMMUNITY): Payer: Self-pay

## 2022-12-09 ENCOUNTER — Other Ambulatory Visit (HOSPITAL_COMMUNITY): Payer: Self-pay

## 2022-12-09 ENCOUNTER — Encounter (INDEPENDENT_AMBULATORY_CARE_PROVIDER_SITE_OTHER): Payer: Self-pay | Admitting: Family Medicine

## 2022-12-09 ENCOUNTER — Ambulatory Visit (INDEPENDENT_AMBULATORY_CARE_PROVIDER_SITE_OTHER): Payer: 59 | Admitting: Family Medicine

## 2022-12-09 DIAGNOSIS — G44009 Cluster headache syndrome, unspecified, not intractable: Secondary | ICD-10-CM | POA: Diagnosis not present

## 2022-12-09 DIAGNOSIS — E559 Vitamin D deficiency, unspecified: Secondary | ICD-10-CM

## 2022-12-09 DIAGNOSIS — F39 Unspecified mood [affective] disorder: Secondary | ICD-10-CM

## 2022-12-09 DIAGNOSIS — R7303 Prediabetes: Secondary | ICD-10-CM

## 2022-12-09 DIAGNOSIS — Z6841 Body Mass Index (BMI) 40.0 and over, adult: Secondary | ICD-10-CM | POA: Diagnosis not present

## 2022-12-09 DIAGNOSIS — D508 Other iron deficiency anemias: Secondary | ICD-10-CM

## 2022-12-09 MED ORDER — TOPIRAMATE ER 50 MG PO CAP24
50.0000 mg | ORAL_CAPSULE | Freq: Every day | ORAL | 1 refills | Status: DC
  Filled 2022-12-09: qty 30, 30d supply, fill #0

## 2022-12-09 NOTE — Progress Notes (Signed)
Donna Bond, D.O.  ABFM, ABOM Specializing in Clinical Bariatric Medicine  Office located at: [address] [place], [place]  [address]     Assessment and Plan:  Review labs with pt next OV. Orders Placed This Encounter  Procedures   VITAMIN D 25 Hydroxy (Vit-D Deficiency, Fractures)   Hemoglobin A1c   CBC with Differential/Platelet   Transferrin   Iron and TIBC   Ferritin   Medications Discontinued During This Encounter  Medication Reason   Topiramate ER (TROKENDI XR) 50 MG CP24 Reorder    Meds ordered this encounter  Medications   Topiramate ER (TROKENDI XR) 50 MG CP24    Sig: 1 po qd    Dispense:  30 capsule    Refill:  1    Mood disorder (HCC)- emotional eating Assessment: Condition is Improving, but not optimized.. She continues to take Zoloft and Wellbutrin after it was increased last OV. She tolerates this well and denies any adverse side effects. She feels as this helps her anxiety some and controls her mood more.   Plan:- Continue Zoloft 50mg  and Wellbutrin 150mg  twice daily. She denies need for refill at this time.   - Discussed the importance of not skipping meals and getting all her proteins and fiber in on a daily basis discussed.    - We will continue to monitor closely alongside PCP.   Prediabetes Assessment: Condition is Not optimized.. This is diet/exercise controlled. Pt endorses having some hunger and cravings but continues to make mindful food choices. Lab Results  Component Value Date   HGBA1C 5.5 03/25/2022   HGBA1C 5.4 10/09/2021   HGBA1C 5.6 07/09/2021   INSULIN 6.1 03/25/2022   INSULIN 9.7 10/09/2021   INSULIN 15.5 07/09/2021    Plan: - Donna Bond will continue to follow the prescribed nutritional plan and work on incorporating exercise to help decrease the risk of progressing to diabetes.   - Continue to decrease simple carbs/ sugars; increase fiber and proteins -> follow her meal plan. I also explained the role of simple carbs and  insulin levels on hunger and cravings  - Anticipatory guidance given.    - We will review her A1c and fasting insulin next OV.    Cluster headache, not intractable, unspecified chronicity pattern Assessment: Condition is Improving, but not optimized. She feels as the Topiramate has helped improve her emotional cravings. Her headaches have decreased in numbers from 1-0, which she then uses OTC tylenol to help alleviate pain and symptoms.   Plan: - Continue to take Topiramate ER 50mg . I will refill this today.    Vitamin D deficiency Assessment: Condition is Not at goal.. Her vitamin D levels are sub-optimal although she has improved with taking ERGO more consistently. She denies any adverse side effects and tolerates this well at this time.  Lab Results  Component Value Date   VD25OH 31.3 03/25/2022   VD25OH 43.4 10/09/2021   VD25OH 80.6 07/09/2021   Plan: - Continue with Ergocalciferol 50K lU weekly and she was encouraged to continue to take the medicine until told otherwise.  She denies need for refill at this time.    - We will continue to monitor levels regularly every 3-4 mo on average to keep levels within normal limits.   Other iron deficiency anemia Assessment: Condition is Not at goal.. Pt's WBC is elevated, while her hemoglobin and MCV are low. She has not been taking her ferrous sulfate supplement consistently. She denies any GI upset, constipation, or any  adverse side effects.  Lab Results  Component Value Date   WBC 15.8 (H) 06/08/2022   HGB 11.3 (L) 06/08/2022   HCT 36.3 06/08/2022   MCV 70.6 (L) 06/08/2022   PLT 304 06/08/2022   Plan: - Continue Ferrous Sulfate 325mg  once daily. She denies need for refill at this time.   - Continue her prudent nutritional plan that is low in simple carbohydrates, saturated fats and trans fats to goal of 5-10% weight loss to achieve significant health benefits.  Pt encouraged to continually advance exercise and cardiovascular  fitness as tolerated throughout weight loss journey.   TREATMENT PLAN FOR OBESITY: BMI 50.0-59.9, adult (HCC)-current bmi 53.58 Morbid obesity (HCC)-start bmi 59.44/date 05/1720 Assessment:  Donna Bond is here to discuss her progress with her obesity treatment plan along with follow-up of her obesity related diagnoses. See Medical Weight Management Flowsheet for complete bioelectrical impedance results.  Condition is docourse: improving. Biometric data collected today, was reviewed with patient.   Since last office visit on 11/07/2022 patient's  Muscle mass has increased by 0.2lb. Fat mass has decreased by 0.8lb. Total body water has decreased by 2.8lb.  Counseling done on how various foods will affect these numbers and how to maximize success  Total lbs lost to date: 32lbs Total weight loss percentage to date: 9.85%   Plan: - Continue to adhere to the prescribed Category 2 nutritional plan with breakfast and lunch options while also journaling following the recommended goals of 1100-1200 calories and 85+ grams of protein daily.   - I advised if she wants to skip dinner then to alternate when she consumes her protein intake to lunch, dinner, or as snacks.   Behavioral Intervention Additional resources provided today: patient declined Evidence-based interventions for health behavior change were utilized today including the discussion of self monitoring techniques, problem-solving barriers and SMART goal setting techniques.   Regarding patient's less desirable eating habits and patterns, we employed the technique of small changes.  Pt will specifically work on: Continue to adhere to the prescribe nutritional plan for next visit.     She has agreed to Think about ways to increase daily physical activity and overcoming barriers to exercise   FOLLOW UP: Return in about 5 weeks (around 01/13/2023).  She was informed of the importance of frequent follow up visits to maximize her success with  intensive lifestyle modifications for her multiple health conditions. [name] is aware that we will review all of her lab results at our next visit together in person.  She is aware that if anything is critical/ life threatening with the results, we will be contacting her via MyChart or by my CMA will be calling them prior to the office visit to discuss acute management.    Subjective:   Chief complaint: Obesity Donna Bond is here to discuss her progress with her obesity treatment plan. She is on the the Category 2 Plan and keeping a food journal and adhering to recommended goals of 1100-1200 calories and 85+ protein with B and L options and states she is following her eating plan approximately 50% of the time. She states she is not exercising.  Interval History:  Donna Bond is here for a follow up office visit.     Since last OV she has been doing well. She informed me that she has been stressed over the last 2 weeks as she was watching her grandmother who has dementia. She has been trying to be more mindful with her food decisions but  does note skipping meals often especially on a hot day.     Pharmacotherapy for weight loss: She is currently taking Topiramate and Wellbutrin for medical weight loss.  Denies side effects.    Review of Systems:  Pertinent positives were addressed with patient today.  Reviewed by clinician on day of visit: allergies, medications, problem list, medical history, surgical history, family history, social history, and previous encounter notes.  Weight Summary and Biometrics   Weight Lost Since Last Visit: 1lb  Weight Gained Since Last Visit: 0lb   Vitals Temp: 98 F (36.7 C) BP: 122/80 Pulse Rate: 75 SpO2: 99 %   Anthropometric Measurements Height: 5\' 2"  (1.575 m) Weight: 293 lb (132.9 kg) BMI (Calculated): 53.58 Weight at Last Visit: 294lb Weight Lost Since Last Visit: 1lb Weight Gained Since Last Visit: 0lb Starting Weight: 325 Total Weight Loss  (lbs): 32 lb (14.5 kg) Peak Weight: 325   Body Composition  Body Fat %: 54.3 % Fat Mass (lbs): 159.4 lbs Muscle Mass (lbs): 127.2 lbs Total Body Water (lbs): 95.8 lbs Visceral Fat Rating : 20   Other Clinical Data Fasting: no Labs: no Today's Visit #: 37 Starting Date: 06/08/20     Objective:   PHYSICAL EXAM: Blood pressure 122/80, pulse 75, temperature 98 F (36.7 C), height 5\' 2"  (1.575 m), weight 293 lb (132.9 kg), SpO2 99%. Body mass index is 53.59 kg/m.  General: Well Developed, well nourished, and in no acute distress.  HEENT: Normocephalic, atraumatic Skin: Warm and dry, cap RF less 2 sec, good turgor Chest:  Normal excursion, shape, no gross abn Respiratory: speaking in full sentences, no conversational dyspnea NeuroM-Sk: Ambulates w/o assistance, moves * 4 Psych: A and O *3, insight good, mood-full  DIAGNOSTIC DATA REVIEWED:  BMET    Component Value Date/Time   NA 136 06/07/2022 2350   NA 139 03/25/2022 1015   K 4.1 06/07/2022 2350   CL 104 06/07/2022 2350   CO2 22 06/07/2022 2350   GLUCOSE 100 (H) 06/07/2022 2350   BUN 13 06/07/2022 2350   BUN 15 03/25/2022 1015   CREATININE 0.87 06/07/2022 2350   CALCIUM 8.8 (L) 06/07/2022 2350   GFRNONAA >60 06/07/2022 2350   GFRAA 114 06/08/2020 1015   Lab Results  Component Value Date   HGBA1C 5.5 03/25/2022   HGBA1C 5.8 06/10/2017   Lab Results  Component Value Date   INSULIN 6.1 03/25/2022   INSULIN 18.2 06/08/2020   Lab Results  Component Value Date   TSH 1.180 07/09/2021   CBC    Component Value Date/Time   WBC 15.8 (H) 06/08/2022 0120   RBC 5.14 (H) 06/08/2022 0120   HGB 11.3 (L) 06/08/2022 0120   HGB 11.6 10/09/2021 0828   HCT 36.3 06/08/2022 0120   HCT 37.9 10/09/2021 0828   PLT 304 06/08/2022 0120   PLT 332 10/09/2021 0828   MCV 70.6 (L) 06/08/2022 0120   MCV 72 (L) 10/09/2021 0828   MCH 22.0 (L) 06/08/2022 0120   MCHC 31.1 06/08/2022 0120   RDW 14.7 06/08/2022 0120   RDW 16.5  (H) 10/09/2021 0828   Iron Studies    Component Value Date/Time   IRON 36 10/09/2021 0828   TIBC 325 10/09/2021 0828   FERRITIN 145 10/09/2021 0828   IRONPCTSAT 11 (L) 10/09/2021 0828   Lipid Panel     Component Value Date/Time   CHOL 111 10/09/2021 0828   TRIG 48 10/09/2021 0828   HDL 40 10/09/2021 0828   CHOLHDL  2.8 10/09/2021 0828   LDLCALC 59 10/09/2021 0828   Hepatic Function Panel     Component Value Date/Time   PROT 7.6 03/25/2022 1015   ALBUMIN 4.1 03/25/2022 1015   AST 14 03/25/2022 1015   ALT 14 03/25/2022 1015   ALKPHOS 119 03/25/2022 1015   BILITOT 0.5 03/25/2022 1015      Component Value Date/Time   TSH 1.180 07/09/2021 0906   Nutritional Lab Results  Component Value Date   VD25OH 31.3 03/25/2022   VD25OH 43.4 10/09/2021   VD25OH 80.6 07/09/2021    Attestations:   I, Clinical biochemist, acting as a Stage manager for [employer], DO., have compiled all relevant documentation for today's office visit on behalf of Thomasene Lot, DO, while in the presence of [employer], DO.  I have reviewed the above documentation for accuracy and completeness, and I agree with the above. Donna Bond, D.O.  The 21st Century Cures Act was signed into law in 2016 which includes the topic of electronic health records.  This provides immediate access to information in MyChart.  This includes consultation notes, operative notes, office notes, lab results and pathology reports.  If you have any questions about what you read please let us know at your next visit so we can discuss your concerns and take corrective action if need be.  We are right here with you.

## 2022-12-10 LAB — CBC WITH DIFFERENTIAL/PLATELET
Basophils Absolute: 0.1 10*3/uL (ref 0.0–0.2)
Basos: 1 %
EOS (ABSOLUTE): 0.1 10*3/uL (ref 0.0–0.4)
Eos: 1 %
Hematocrit: 40.2 % (ref 34.0–46.6)
Hemoglobin: 12.3 g/dL (ref 11.1–15.9)
Immature Grans (Abs): 0.1 10*3/uL (ref 0.0–0.1)
Immature Granulocytes: 1 %
Lymphocytes Absolute: 2.9 10*3/uL (ref 0.7–3.1)
Lymphs: 27 %
MCH: 22.2 pg — ABNORMAL LOW (ref 26.6–33.0)
MCHC: 30.6 g/dL — ABNORMAL LOW (ref 31.5–35.7)
MCV: 73 fL — ABNORMAL LOW (ref 79–97)
Monocytes Absolute: 0.7 10*3/uL (ref 0.1–0.9)
Monocytes: 7 %
Neutrophils Absolute: 7 10*3/uL (ref 1.4–7.0)
Neutrophils: 63 %
Platelets: 296 10*3/uL (ref 150–450)
RBC: 5.54 x10E6/uL — ABNORMAL HIGH (ref 3.77–5.28)
RDW: 17.8 % — ABNORMAL HIGH (ref 11.7–15.4)
WBC: 11 10*3/uL — ABNORMAL HIGH (ref 3.4–10.8)

## 2022-12-10 LAB — HEMOGLOBIN A1C
Est. average glucose Bld gHb Est-mCnc: 123 mg/dL
Hgb A1c MFr Bld: 5.9 % — ABNORMAL HIGH (ref 4.8–5.6)

## 2022-12-10 LAB — VITAMIN D 25 HYDROXY (VIT D DEFICIENCY, FRACTURES): Vit D, 25-Hydroxy: 40.6 ng/mL (ref 30.0–100.0)

## 2022-12-26 ENCOUNTER — Encounter: Payer: Self-pay | Admitting: Internal Medicine

## 2022-12-26 ENCOUNTER — Ambulatory Visit (INDEPENDENT_AMBULATORY_CARE_PROVIDER_SITE_OTHER): Payer: 59 | Admitting: Internal Medicine

## 2022-12-26 VITALS — BP 114/86 | HR 75 | Temp 98.2°F | Ht 62.0 in | Wt 295.0 lb

## 2022-12-26 DIAGNOSIS — F3289 Other specified depressive episodes: Secondary | ICD-10-CM
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
## 2023-01-07 ENCOUNTER — Encounter (INDEPENDENT_AMBULATORY_CARE_PROVIDER_SITE_OTHER): Payer: Self-pay | Admitting: Family Medicine

## 2023-01-07 ENCOUNTER — Other Ambulatory Visit (HOSPITAL_COMMUNITY): Payer: Self-pay

## 2023-01-07 ENCOUNTER — Other Ambulatory Visit: Payer: Self-pay

## 2023-01-07 ENCOUNTER — Ambulatory Visit (INDEPENDENT_AMBULATORY_CARE_PROVIDER_SITE_OTHER): Payer: 59 | Admitting: Family Medicine

## 2023-01-07 VITALS — BP 126/78 | HR 80 | Temp 98.6°F | Ht 62.0 in | Wt 293.0 lb

## 2023-01-07 DIAGNOSIS — G44009 Cluster headache syndrome, unspecified, not intractable: Secondary | ICD-10-CM

## 2023-01-07 DIAGNOSIS — R7303 Prediabetes: Secondary | ICD-10-CM | POA: Diagnosis not present

## 2023-01-07 DIAGNOSIS — F39 Unspecified mood [affective] disorder: Secondary | ICD-10-CM

## 2023-01-07 DIAGNOSIS — F4323 Adjustment disorder with mixed anxiety and depressed mood: Secondary | ICD-10-CM

## 2023-01-07 DIAGNOSIS — E559 Vitamin D deficiency, unspecified: Secondary | ICD-10-CM

## 2023-01-07 DIAGNOSIS — Z6841 Body Mass Index (BMI) 40.0 and over, adult: Secondary | ICD-10-CM | POA: Diagnosis not present

## 2023-01-07 DIAGNOSIS — D508 Other iron deficiency anemias: Secondary | ICD-10-CM | POA: Diagnosis not present

## 2023-01-07 MED ORDER — FERROUS SULFATE 325 (65 FE) MG PO TABS
325.0000 mg | ORAL_TABLET | Freq: Two times a day (BID) | ORAL | Status: DC
Start: 2023-01-07 — End: 2023-07-29

## 2023-01-07 MED ORDER — TOPIRAMATE ER 50 MG PO CAP24
50.0000 mg | ORAL_CAPSULE | Freq: Every day | ORAL | 0 refills | Status: DC
  Filled 2023-01-07: qty 90, 90d supply, fill #0

## 2023-01-07 MED ORDER — BUPROPION HCL ER (XL) 300 MG PO TB24
300.0000 mg | ORAL_TABLET | Freq: Every day | ORAL | 0 refills | Status: DC
Start: 2023-01-07 — End: 2023-04-29
  Filled 2023-01-07: qty 90, 90d supply, fill #0

## 2023-01-07 MED ORDER — VITAMIN D (ERGOCALCIFEROL) 1.25 MG (50000 UNIT) PO CAPS
ORAL_CAPSULE | ORAL | 0 refills | Status: DC
Start: 2023-01-07 — End: 2023-02-04
  Filled 2023-01-07: qty 15, fill #0
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
## 2023-01-16 ENCOUNTER — Other Ambulatory Visit (HOSPITAL_COMMUNITY): Payer: Self-pay

## 2023-02-03 DIAGNOSIS — F411 Generalized anxiety disorder: Secondary | ICD-10-CM | POA: Diagnosis not present

## 2023-02-04 ENCOUNTER — Ambulatory Visit: Payer: 59 | Admitting: Bariatrics

## 2023-02-04 ENCOUNTER — Encounter (INDEPENDENT_AMBULATORY_CARE_PROVIDER_SITE_OTHER): Payer: Self-pay | Admitting: Family Medicine

## 2023-02-04 ENCOUNTER — Ambulatory Visit (INDEPENDENT_AMBULATORY_CARE_PROVIDER_SITE_OTHER): Payer: 59 | Admitting: Family Medicine

## 2023-02-04 ENCOUNTER — Other Ambulatory Visit (HOSPITAL_COMMUNITY): Payer: Self-pay

## 2023-02-04 VITALS — BP 111/82 | HR 90 | Temp 98.9°F | Ht 62.0 in | Wt 290.0 lb

## 2023-02-04 DIAGNOSIS — G44009 Cluster headache syndrome, unspecified, not intractable: Secondary | ICD-10-CM

## 2023-02-04 DIAGNOSIS — F5089 Other specified eating disorder: Secondary | ICD-10-CM | POA: Diagnosis not present

## 2023-02-04 DIAGNOSIS — E559 Vitamin D deficiency, unspecified: Secondary | ICD-10-CM

## 2023-02-04 DIAGNOSIS — Z6841 Body Mass Index (BMI) 40.0 and over, adult: Secondary | ICD-10-CM

## 2023-02-04 DIAGNOSIS — F39 Unspecified mood [affective] disorder: Secondary | ICD-10-CM

## 2023-02-04 MED ORDER — VITAMIN D (ERGOCALCIFEROL) 1.25 MG (50000 UNIT) PO CAPS
50000.0000 [IU] | ORAL_CAPSULE | ORAL | 0 refills | Status: DC
  Filled 2023-02-04 – 2023-03-06 (×2): qty 15, 90d supply, fill #0

## 2023-02-04 MED ORDER — SERTRALINE HCL 50 MG PO TABS
50.0000 mg | ORAL_TABLET | Freq: Every day | ORAL | 0 refills | Status: DC
Start: 2023-02-04 — End: 2023-04-29
  Filled 2023-02-04 – 2023-03-06 (×2): qty 90, 90d supply, fill #0

## 2023-02-04 NOTE — Progress Notes (Signed)
Donna Bond, D.O.  ABFM, ABOM Specializing in Clinical Bariatric Medicine  Office located at: [address] [place], [place]  [address]     Assessment and Plan:   Medications Discontinued During This Encounter  Medication Reason   sertraline (ZOLOFT) 50 MG tablet Reorder   Vitamin D, Ergocalciferol, (DRISDOL) 1.25 MG (50000 UNIT) CAPS capsule Reorder   meloxicam (MOBIC) 15 MG tablet      Meds ordered this encounter  Medications   Vitamin D, Ergocalciferol, (DRISDOL) 1.25 MG (50000 UNIT) CAPS capsule    Sig: take 1 capsule by mouth every 6 days.    Dispense:  15 capsule    Refill:  0   sertraline (ZOLOFT) 50 MG tablet    Sig: Take 1 tablet (50 mg total) by mouth daily.    Dispense:  90 tablet    Refill:  0    Ov for rf    Vitamin D deficiency Assessment & Plan: Last vitamin D was 40.6 on 12/09/22. Pt has been off high-dose vitamin D since last OV (9/17) due to pick up issues at the pharmacy. Continue with their weight loss efforts and we will refill ERGO today - maintain at current dose. Will recheck levels in 3-4 mos.    Cluster headache, not intractable, unspecified chronicity pattern Assessment & Plan: Headaches have been controlled with Topiramate ER 50 mg daily. Pt feels that she is having less cravings/doing less emotional eating. Continue with Topriamate at current dose.    Mood disorder (HCC)- emotional eating Assessment & Plan: Current medication regiment: Zoloft 50 mg daily & Wellbutrin XL 300 mg daily. Pt feels that overall her anxiety is improving, however she reports that "some days are challenging". Apart from the medications, pt also reports doing mindful breathing a few times a week. She will be starting counseling through "[facility]" in the near future. Encouraged pt to do 4-7-8 breathing twice daily to decrease anxiety. Will refill Zoloft today and she will maintain with Wellbutrin at current dose.    BMI 50.0-59.9, adult (HCC)-current bmi  53.03 Morbid obesity (HCC)-start bmi 59.44/date 05/1720 Assessment & Plan: Since last office visit on 01/07/23 patient's muscle mass has decreased by 0.4 lb. Fat mass has decreased by 2.2 lb. Total body water has decreased by 1.8 lb.  Counseling done on how various foods will affect these numbers and how to maximize success  Total lbs lost to date: 35 lbs  Total weight loss percentage to date: 10.77%   No change to meal plan - see Subjective  Behavioral Intervention Additional resources provided today: Tips for Halloween Evidence-based interventions for health behavior change were utilized today including the discussion of self monitoring techniques, problem-solving barriers and SMART goal setting techniques.   Regarding patient's less desirable eating habits and patterns, we employed the technique of small changes.  Pt will specifically work on: increasing activity (e.g walking) for next visit.    FOLLOW UP: Return 03/04/23. She was informed of the importance of frequent follow up visits to maximize her success with intensive lifestyle modifications for her multiple health conditions.  Subjective:   Chief complaint: Obesity Donna Bond is here to discuss her progress with her obesity treatment plan. She is on the Category 2 Plan with B/L options and keeping a food journal and adhering to recommended goals of 1100-1200 calories and 85+ protein and states she is following her eating plan approximately 65% of the time. She states she is not exercising.   Interval History:  Donna  Bond is here for a follow up office visit. Since last OV, Caylie has been doing well. Pt reports doing better with her protein intake. Hunger and cravings are pretty well controlled. No complaints with her meal plan.   Pharmacotherapy for weight loss: She is currently taking  Wellbutrin XL 300 mg daily & Trokendi XR 50 mg daily   for medical weight loss.  Denies side effects.    Review of Systems:  Pertinent positives were  addressed with patient today.  Reviewed by clinician on day of visit: allergies, medications, problem list, medical history, surgical history, family history, social history, and previous encounter notes.  Weight Summary and Biometrics   Weight Lost Since Last Visit: 3lb  Weight Gained Since Last Visit: 0lb    Vitals Temp: 98.9 F (37.2 C) BP: 111/82 Pulse Rate: 90 SpO2: 97 %   Anthropometric Measurements Height: 5\' 2"  (1.575 m) Weight: 290 lb (131.5 kg) BMI (Calculated): 53.03 Weight at Last Visit: 293 lb Weight Lost Since Last Visit: 3lb Weight Gained Since Last Visit: 0lb Starting Weight: 325 lb Total Weight Loss (lbs): 35 lb (15.9 kg) Peak Weight: 325lb   Body Composition  Body Fat %: 54.1 % Fat Mass (lbs): 157.2 lbs Muscle Mass (lbs): 126.4 lbs Total Body Water (lbs): 94.6 lbs Visceral Fat Rating : 20   Other Clinical Data Fasting: no Labs: no Today's Visit #: 39 Starting Date: 06/08/20   Objective:   PHYSICAL EXAM: Blood pressure 111/82, pulse 90, temperature 98.9 F (37.2 C), height 5\' 2"  (1.575 m), weight 290 lb (131.5 kg), SpO2 97%. Body mass index is 53.04 kg/m.  General: Well Developed, well nourished, and in no acute distress.  HEENT: Normocephalic, atraumatic Skin: Warm and dry, cap RF less 2 sec, good turgor Chest:  Normal excursion, shape, no gross abn Respiratory: speaking in full sentences, no conversational dyspnea NeuroM-Sk: Ambulates w/o assistance, moves * 4 Psych: A and O *3, insight good, mood-full  DIAGNOSTIC DATA REVIEWED:  BMET    Component Value Date/Time   NA 136 06/07/2022 2350   NA 139 03/25/2022 1015   K 4.1 06/07/2022 2350   CL 104 06/07/2022 2350   CO2 22 06/07/2022 2350   GLUCOSE 100 (H) 06/07/2022 2350   BUN 13 06/07/2022 2350   BUN 15 03/25/2022 1015   CREATININE 0.87 06/07/2022 2350   CALCIUM 8.8 (L) 06/07/2022 2350   GFRNONAA >60 06/07/2022 2350   GFRAA 114 06/08/2020 1015   Lab Results  Component  Value Date   HGBA1C 5.9 (H) 12/09/2022   HGBA1C 5.8 06/10/2017   Lab Results  Component Value Date   INSULIN 6.1 03/25/2022   INSULIN 18.2 06/08/2020   Lab Results  Component Value Date   TSH 1.180 07/09/2021   CBC    Component Value Date/Time   WBC 11.0 (H) 12/09/2022 1206   WBC 15.8 (H) 06/08/2022 0120   RBC 5.54 (H) 12/09/2022 1206   RBC 5.14 (H) 06/08/2022 0120   HGB 12.3 12/09/2022 1206   HCT 40.2 12/09/2022 1206   PLT 296 12/09/2022 1206   MCV 73 (L) 12/09/2022 1206   MCH 22.2 (L) 12/09/2022 1206   MCH 22.0 (L) 06/08/2022 0120   MCHC 30.6 (L) 12/09/2022 1206   MCHC 31.1 06/08/2022 0120   RDW 17.8 (H) 12/09/2022 1206   Iron Studies    Component Value Date/Time   IRON 36 10/09/2021 0828   TIBC 325 10/09/2021 0828   FERRITIN 145 10/09/2021 0828   IRONPCTSAT 11 (  L) 10/09/2021 0828   Lipid Panel     Component Value Date/Time   CHOL 111 10/09/2021 0828   TRIG 48 10/09/2021 0828   HDL 40 10/09/2021 0828   CHOLHDL 2.8 10/09/2021 0828   LDLCALC 59 10/09/2021 0828   Hepatic Function Panel     Component Value Date/Time   PROT 7.6 03/25/2022 1015   ALBUMIN 4.1 03/25/2022 1015   AST 14 03/25/2022 1015   ALT 14 03/25/2022 1015   ALKPHOS 119 03/25/2022 1015   BILITOT 0.5 03/25/2022 1015      Component Value Date/Time   TSH 1.180 07/09/2021 0906   Nutritional Lab Results  Component Value Date   VD25OH 40.6 12/09/2022   VD25OH 31.3 03/25/2022   VD25OH 43.4 10/09/2021    Attestations:   I, Special Puri, acting as a Stage manager for [employer], DO., have compiled all relevant documentation for today's office visit on behalf of Thomasene Lot, DO, while in the presence of [employer], DO.  I have reviewed the above documentation for accuracy and completeness, and I agree with the above. Donna Bond, D.O.  The 21st Century Cures Act was signed into law in 2016 which includes the topic of electronic health records.  This provides immediate  access to information in MyChart.  This includes consultation notes, operative notes, office notes, lab results and pathology reports.  If you have any questions about what you read please let us know at your next visit so we can discuss your concerns and take corrective action if need be.  We are right here with you.

## 2023-02-12 DIAGNOSIS — F411 Generalized anxiety disorder: Secondary | ICD-10-CM | POA: Diagnosis not present

## 2023-02-14 ENCOUNTER — Other Ambulatory Visit (HOSPITAL_COMMUNITY): Payer: Self-pay

## 2023-02-19 DIAGNOSIS — F411 Generalized anxiety disorder: Secondary | ICD-10-CM | POA: Diagnosis not present

## 2023-02-26 DIAGNOSIS — F411 Generalized anxiety disorder: Secondary | ICD-10-CM | POA: Diagnosis not present

## 2023-03-04 ENCOUNTER — Ambulatory Visit (INDEPENDENT_AMBULATORY_CARE_PROVIDER_SITE_OTHER): Payer: 59 | Admitting: Family Medicine

## 2023-03-04 ENCOUNTER — Encounter (INDEPENDENT_AMBULATORY_CARE_PROVIDER_SITE_OTHER): Payer: Self-pay | Admitting: Family Medicine

## 2023-03-04 VITALS — BP 130/80 | HR 84 | Temp 98.8°F | Ht 62.0 in | Wt 295.0 lb

## 2023-03-04 DIAGNOSIS — Z6841 Body Mass Index (BMI) 40.0 and over, adult: Secondary | ICD-10-CM

## 2023-03-04 DIAGNOSIS — F39 Unspecified mood [affective] disorder: Secondary | ICD-10-CM

## 2023-03-04 DIAGNOSIS — R7303 Prediabetes: Secondary | ICD-10-CM

## 2023-03-04 DIAGNOSIS — G4719 Other hypersomnia: Secondary | ICD-10-CM | POA: Diagnosis not present

## 2023-03-04 NOTE — Progress Notes (Signed)
Donna Bond, D.O.  ABFM, ABOM Specializing in Clinical Bariatric Medicine  Office located at: [address] [place], [place]  [address]   Assessment and Plan:   FOR THE DISEASE OF OBESITY:  Since last office visit on 02/04/23 patient's muscle mass has not changed. Fat mass has increased by 5.4 lb. Total body water has increased by 2.6 lb.  Counseling done on how various foods will affect these numbers and how to maximize success  Total lbs lost to date: 30 lbs  Total weight loss percentage to date: 9.23%    Recommended Dietary Goals Donna Bond is currently in the action stage of change. As such, her goal is to continue weight management plan.  She has agreed to: continue current plan   Behavioral Intervention We discussed the following Behavioral Modification Strategies today: continue to work on maintaining a reduced calorie state, getting the recommended amount of protein, incorporating whole foods, making healthy choices, staying well hydrated and practicing mindfulness when eating.  Additional resources provided today: Handout on holiday eating strategies & Thanksgiving strategy handout.   Evidence-based interventions for health behavior change were utilized today including the discussion of self monitoring techniques, problem-solving barriers and SMART goal setting techniques.   Regarding patient's less desirable eating habits and patterns, we employed the technique of small changes.   Pt will specifically work on: not skipping meals    Recommended Physical Activity Goals Donna Bond has been advised to work up to 150 minutes of moderate intensity aerobic activity a week and strengthening exercises 2-3 times per week for cardiovascular health, weight loss maintenance and preservation of muscle mass.   She has agreed to :  increasing walking to 15 minutes, 5 days a week   Pharmacotherapy We both agreed to : continue current anti-obesity medication regimen   FOR  ASSOCIATED CONDITIONS ADDRESSED TODAY:  Mood disorder (HCC)- emotional eating Assessment & Plan: Current medication regimen: Wellbutrin XL 300 mg daily and Trokendi XR 50 mg daily. Pt completed coaching and therapy with "[facility]", which she found somewhat helpful. Unfortunately, Donna Bond endorses that her anxiety has been worse lately, which has contributed to increased emotional eating.   Continue with both medications at current dose. Recommend Dr.Barker, our bariatric psychologist, to help with emotional eating tendencies - pt declines referral today.   Behavior modification techniques were discussed today to help deal  non-hunger eating behaviors such as obtaining further counseling (e.g through [facility]) and engaging in regular moderate-intense activity.   Counseling also done on the following mindful eating tips: At meal and snack times, put away electronics (TV, phone, tablet, etc.) and try to eat seated at a table so you can better focus on eating your meal/snack and promote listening to your body's fullness and hunger signals.  If you feel that you are wanting to snack because your are bored or due to emotions and not because you are hungry, try to do a fun activity (read a book, take a walk, talk with a friend, etc.) for at least 15 minutes.     Excessive daytime sleepiness Assessment & Plan: Lately, pt reports being extremely tired during the day - she does report sleeping well. She has never been tested for sleep apnea.    STOP BANG:  Snore:     yes Tired:     yes Observed stop breathing:  Unknown Hypertension:   no  BMI >35:   yes Age >50:   no Neck > 16 inches:  yes Female gender:  no ------------------------------------------ Total:     4   Score: 1 point for each "yes" answer  0-2:  Low risk  3-4:  Intermediate risk  >4:   High risk   Epworth sleepiness scale   Rate situations associated with sleepiness:   Sitting and reading   No chance of dozing (0  points)   Slight chance of dozing (1 point)   Moderate chance of dozing (2 points)   High chance of dozing (3 points)   Watching television   No chance of dozing (0 points)   Slight chance of dozing (1 point)   Moderate chance of dozing (2 points)   High chance of dozing (3 points)   Sitting inactive in a public place   No chance of dozing (0 points)   Slight chance of dozing (1 point)   Moderate chance of dozing (2 points)   High chance of dozing (3 points)   Sitting for an hour as a passenger in a car   No chance of dozing (0 points)   Slight chance of dozing (1 point)   Moderate chance of dozing (2 points)   High chance of dozing (3 points)   Lying down in the afternoon to rest   No chance of dozing (0 points)   Slight chance of dozing (1 point)   Moderate chance of dozing (2 points)   High chance of dozing (3 points)    Sitting and talking to another person   No chance of dozing (0 points)   Slight chance of dozing (1 point)   Moderate chance of dozing (2 points)   High chance of dozing (3 points)    Sitting quietly after a lunch (no alcohol at lunch)   No chance of dozing (0 points)   Slight chance of dozing (1 point)   Moderate chance of dozing (2 points)   High chance of dozing (3 points)    Sitting in a car, stopped for a few minutes due to traffic   No chance of dozing (0 points)   Slight chance of dozing (1 point)   Moderate chance of dozing (2 points)   High chance of dozing (3 points)    Interpretation: 1 to 6 points: Normal sleep 7 to 8 points: Average sleepiness 9 to 24 points: Abnormal (possibly pathologic) sleepiness   I highly recommend pt get evaluated for sleep apnea based on her medical & personal hx as well as findings from STOP Bang and Epworth sleepiness scale questionnaire. Discussed potential risks of sleep apnea. She prefers to discuss this condition further with PCP at next OV.     Prediabetes Assessment & Plan: Most recent Hemoglobin A1c of 5.9 on 12/09/22. No current meds. Diet/exercise approach. Pt admits to doing more emotional eating lately (see Mood Disorder note). Has been snacking on potato chips.  Briefly explained role of simple carbs and insulin levels on hunger and cravings. Continue to decrease simple carbs/ sugars; increase fiber and proteins -> follow her meal plan. Will recheck A1c and fasting insulin in 1-2 mos or so.    BMI 50.0-59.9, adult (HCC)-current bmi 53.94 Morbid obesity (HCC)-start bmi 59.44/date 05/1720 Assessment & Plan: See obesity treatment note.   FOLLOW UP: Return 04/29/23. She was informed of the importance of frequent follow up visits to maximize her success with intensive lifestyle modifications for her multiple health conditions.  Subjective:   Chief complaint: Obesity Donna Bond is here to discuss her progress with her obesity treatment plan. She is  on the Category 2 Plan with B/L options and keeping a food journal and adhering to recommended goals of 1100-1200 calories and 85+ protein and states she is following her eating plan approximately 20% of the time. She states she is walking 60 minutes 1 day per week.  Interval History:  Donna Bond is here for a follow up office visit. Since last OV, Donna Bond is up 5 lbs. She endorses not implementing the meal plan well this time. She admits that her anxiety "has been high" the past few weeks and has been doing more emotional eating. Reports going out to buy "off-plan" foods like potatoe chips. Has not been journaling her intake. Reports mostly skipping breakfast and sometimes dinner. Additionally , pt reports being extremely tired during the day lately.   Pharmacotherapy for weight loss: She is currently taking  Wellbutrin XL 300 mg daily & Trokendi XR 50 mg daily .   Review of Systems:  Pertinent positives were addressed with patient today.  Reviewed by clinician on day of visit: allergies,  medications, problem list, medical history, surgical history, family history, social history, and previous encounter notes.  Weight Summary and Biometrics   Weight Lost Since Last Visit: 5lb  Weight Gained Since Last Visit: 0lb   Vitals Temp: 98.8 F (37.1 C) BP: 130/80 Pulse Rate: 84 SpO2: 97 %   Anthropometric Measurements Height: 5\' 2"  (1.575 m) Weight: 295 lb (133.8 kg) BMI (Calculated): 53.94 Weight at Last Visit: 290lb Weight Lost Since Last Visit: 5lb Weight Gained Since Last Visit: 0lb Starting Weight: 325 lb Total Weight Loss (lbs): 30 lb (13.6 kg) Peak Weight: 325lb   Body Composition  Body Fat %: 55 % Fat Mass (lbs): 162.6 lbs Muscle Mass (lbs): 126.4 lbs Total Body Water (lbs): 97.2 lbs Visceral Fat Rating : 21   Other Clinical Data Fasting: no Labs: no Today's Visit #: 40 Starting Date: 06/08/20   Objective:   PHYSICAL EXAM: Blood pressure 130/80, pulse 84, temperature 98.8 F (37.1 C), height 5\' 2"  (1.575 m), weight 295 lb (133.8 kg), SpO2 97%. Body mass index is 53.96 kg/m.  General: she is overweight, cooperative and in no acute distress. PSYCH: Has normal mood, affect and thought process.   HEENT: EOMI, sclerae are anicteric. Lungs: Normal breathing effort, no conversational dyspnea. Extremities: Moves * 4 Neurologic: A and O * 3, good insight  DIAGNOSTIC DATA REVIEWED: BMET    Component Value Date/Time   NA 136 06/07/2022 2350   NA 139 03/25/2022 1015   K 4.1 06/07/2022 2350   CL 104 06/07/2022 2350   CO2 22 06/07/2022 2350   GLUCOSE 100 (H) 06/07/2022 2350   BUN 13 06/07/2022 2350   BUN 15 03/25/2022 1015   CREATININE 0.87 06/07/2022 2350   CALCIUM 8.8 (L) 06/07/2022 2350   GFRNONAA >60 06/07/2022 2350   GFRAA 114 06/08/2020 1015   Lab Results  Component Value Date   HGBA1C 5.9 (H) 12/09/2022   HGBA1C 5.8 06/10/2017   Lab Results  Component Value Date   INSULIN 6.1 03/25/2022   INSULIN 18.2 06/08/2020   Lab  Results  Component Value Date   TSH 1.180 07/09/2021   CBC    Component Value Date/Time   WBC 11.0 (H) 12/09/2022 1206   WBC 15.8 (H) 06/08/2022 0120   RBC 5.54 (H) 12/09/2022 1206   RBC 5.14 (H) 06/08/2022 0120   HGB 12.3 12/09/2022 1206   HCT 40.2 12/09/2022 1206   PLT 296 12/09/2022 1206   MCV  73 (L) 12/09/2022 1206   MCH 22.2 (L) 12/09/2022 1206   MCH 22.0 (L) 06/08/2022 0120   MCHC 30.6 (L) 12/09/2022 1206   MCHC 31.1 06/08/2022 0120   RDW 17.8 (H) 12/09/2022 1206   Iron Studies    Component Value Date/Time   IRON 36 10/09/2021 0828   TIBC 325 10/09/2021 0828   FERRITIN 145 10/09/2021 0828   IRONPCTSAT 11 (L) 10/09/2021 0828   Lipid Panel     Component Value Date/Time   CHOL 111 10/09/2021 0828   TRIG 48 10/09/2021 0828   HDL 40 10/09/2021 0828   CHOLHDL 2.8 10/09/2021 0828   LDLCALC 59 10/09/2021 0828   Hepatic Function Panel     Component Value Date/Time   PROT 7.6 03/25/2022 1015   ALBUMIN 4.1 03/25/2022 1015   AST 14 03/25/2022 1015   ALT 14 03/25/2022 1015   ALKPHOS 119 03/25/2022 1015   BILITOT 0.5 03/25/2022 1015      Component Value Date/Time   TSH 1.180 07/09/2021 0906   Nutritional Lab Results  Component Value Date   VD25OH 40.6 12/09/2022   VD25OH 31.3 03/25/2022   VD25OH 43.4 10/09/2021    Attestations:   I, Special Puri, acting as a Stage manager for [employer], DO., have compiled all relevant documentation for today's office visit on behalf of Thomasene Lot, DO, while in the presence of [employer], DO.  Reviewed by clinician on day of visit: allergies, medications, problem list, medical history, surgical history, family history, social history, and previous encounter notes pertinent to patient's obesity diagnosis.I have spent 48 minutes in the care of the patient today including: preparing to see patient (e.g. review and interpretation of tests, old notes ), obtaining and/or reviewing separately obtained history,  performing a medically appropriate examination or evaluation, counseling and educating the patient, ordering medications, test or procedures, documenting clinical information in the electronic or other health care record, and independently interpreting results and communicating results to the patient, family, or caregiver .  I have reviewed the above documentation for accuracy and completeness, and I agree with the above. Donna Bond, D.O.  The 21st Century Cures Act was signed into law in 2016 which includes the topic of electronic health records.  This provides immediate access to information in MyChart.  This includes consultation notes, operative notes, office notes, lab results and pathology reports.  If you have any questions about what you read please let us know at your next visit so we can discuss your concerns and take corrective action if need be.  We are right here with you.

## 2023-03-06 ENCOUNTER — Other Ambulatory Visit (HOSPITAL_COMMUNITY): Payer: Self-pay

## 2023-03-17 ENCOUNTER — Encounter: Payer: Self-pay | Admitting: Internal Medicine

## 2023-03-17 ENCOUNTER — Other Ambulatory Visit (HOSPITAL_COMMUNITY): Payer: Self-pay

## 2023-03-17 ENCOUNTER — Ambulatory Visit (INDEPENDENT_AMBULATORY_CARE_PROVIDER_SITE_OTHER): Payer: 59 | Admitting: Internal Medicine

## 2023-03-17 VITALS — BP 118/88 | HR 81 | Temp 98.6°F | Ht 62.0 in | Wt 297.0 lb

## 2023-03-17 DIAGNOSIS — F39 Unspecified mood [affective] disorder: Secondary | ICD-10-CM | POA: Diagnosis not present

## 2023-03-17 DIAGNOSIS — Z0000 Encounter for general adult medical examination without abnormal findings: Secondary | ICD-10-CM | POA: Diagnosis not present

## 2023-03-17 DIAGNOSIS — F3289 Other specified depressive episodes: Secondary | ICD-10-CM

## 2023-03-17 DIAGNOSIS — R7303 Prediabetes: Secondary | ICD-10-CM

## 2023-03-17 DIAGNOSIS — R0683 Snoring: Secondary | ICD-10-CM | POA: Diagnosis not present

## 2023-03-17 MED ORDER — ALBUTEROL SULFATE HFA 108 (90 BASE) MCG/ACT IN AERS
2.0000 | INHALATION_SPRAY | Freq: Four times a day (QID) | RESPIRATORY_TRACT | 0 refills | Status: AC | PRN
  Filled 2023-03-17: qty 6.7, 25d supply, fill #0

## 2023-03-17 MED ORDER — FLUTICASONE PROPIONATE 50 MCG/ACT NA SUSP
2.0000 | Freq: Every day | NASAL | 6 refills | Status: DC
  Filled 2023-03-17: qty 16, 30d supply, fill #0

## 2023-03-17 NOTE — Patient Instructions (Signed)
We will get the sleep study.

## 2023-03-17 NOTE — Assessment & Plan Note (Signed)
Ordered sleep test at lab per patient preference.

## 2023-03-17 NOTE — Assessment & Plan Note (Signed)
Using coping skills and taking wellbutrin 300 mg daily to help. Continue same.

## 2023-03-17 NOTE — Assessment & Plan Note (Signed)
Flu shot complete. Tetanus up to date. Mammogram with gyn, pap smear with gyn. Counseled about sun safety and mole surveillance. Counseled about the dangers of distracted driving. Given 10 year screening recommendations.

## 2023-03-17 NOTE — Progress Notes (Signed)
   Subjective:   Patient ID: Donna Bond, female    DOB: 07-19-1980, 42 y.o.   MRN: [id number]  HPI The patient is here for physical. Recommended sleep test with healthy weight and wellness but did not pursue this.   PMH, [facility], social history reviewed and updated  Review of Systems  Constitutional: Negative.   HENT: Negative.    Eyes: Negative.   Respiratory:  Negative for cough, chest tightness and shortness of breath.   Cardiovascular:  Negative for chest pain, palpitations and leg swelling.  Gastrointestinal:  Negative for abdominal distention, abdominal pain, constipation, diarrhea, nausea and vomiting.  Musculoskeletal: Negative.   Skin: Negative.   Neurological: Negative.   Psychiatric/Behavioral:  Positive for decreased concentration and dysphoric mood.     Objective:  Physical Exam Constitutional:      Appearance: She is well-developed. She is obese.  HENT:     Head: Normocephalic and atraumatic.  Cardiovascular:     Rate and Rhythm: Normal rate and regular rhythm.  Pulmonary:     Effort: Pulmonary effort is normal. No respiratory distress.     Breath sounds: Normal breath sounds. No wheezing or rales.  Abdominal:     General: Bowel sounds are normal. There is no distension.     Palpations: Abdomen is soft.     Tenderness: There is no abdominal tenderness. There is no rebound.  Musculoskeletal:     Cervical back: Normal range of motion.  Skin:    General: Skin is warm and dry.  Neurological:     Mental Status: She is alert and oriented to person, place, and time.     Coordination: Coordination normal.     Vitals:   03/17/23 0913  BP: 118/88  Pulse: 81  Temp: 98.6 F (37 C)  TempSrc: Oral  SpO2: 99%  Weight: 297 lb (134.7 kg)  Height: 5\' 2"  (1.575 m)    Assessment & Plan:

## 2023-03-17 NOTE — Assessment & Plan Note (Signed)
Some improvement with wellbutrin 300 mg daily and still taking zoloft 50 mg daily. Will continue and she is increasing self care activities.

## 2023-03-17 NOTE — Assessment & Plan Note (Signed)
Seeing healthy weight and wellness and taking wellbutrin and topiramate. Continue.

## 2023-03-17 NOTE — Assessment & Plan Note (Signed)
Most recent HgA1c up from prior. She had been doing more emotional eating. Will monitor every 6 months.

## 2023-04-28 ENCOUNTER — Encounter: Payer: Self-pay | Admitting: Internal Medicine

## 2023-04-29 ENCOUNTER — Ambulatory Visit (INDEPENDENT_AMBULATORY_CARE_PROVIDER_SITE_OTHER): Payer: Commercial Managed Care - PPO | Admitting: Family Medicine

## 2023-04-29 ENCOUNTER — Encounter (INDEPENDENT_AMBULATORY_CARE_PROVIDER_SITE_OTHER): Payer: Self-pay | Admitting: Family Medicine

## 2023-04-29 ENCOUNTER — Other Ambulatory Visit (HOSPITAL_COMMUNITY): Payer: Self-pay

## 2023-04-29 ENCOUNTER — Encounter (HOSPITAL_COMMUNITY): Payer: Self-pay

## 2023-04-29 VITALS — BP 130/82 | HR 92 | Temp 98.6°F | Ht 62.0 in | Wt 299.0 lb

## 2023-04-29 DIAGNOSIS — E559 Vitamin D deficiency, unspecified: Secondary | ICD-10-CM | POA: Diagnosis not present

## 2023-04-29 DIAGNOSIS — R7303 Prediabetes: Secondary | ICD-10-CM

## 2023-04-29 DIAGNOSIS — D508 Other iron deficiency anemias: Secondary | ICD-10-CM

## 2023-04-29 DIAGNOSIS — Z6841 Body Mass Index (BMI) 40.0 and over, adult: Secondary | ICD-10-CM | POA: Diagnosis not present

## 2023-04-29 DIAGNOSIS — G44009 Cluster headache syndrome, unspecified, not intractable: Secondary | ICD-10-CM

## 2023-04-29 DIAGNOSIS — F4323 Adjustment disorder with mixed anxiety and depressed mood: Secondary | ICD-10-CM | POA: Diagnosis not present

## 2023-04-29 MED ORDER — VITAMIN D (ERGOCALCIFEROL) 1.25 MG (50000 UNIT) PO CAPS
50000.0000 [IU] | ORAL_CAPSULE | ORAL | 0 refills | Status: DC
  Filled 2023-04-29: qty 6, 42d supply, fill #0
  Filled 2023-04-30: qty 6, 36d supply, fill #0

## 2023-04-29 MED ORDER — BUPROPION HCL ER (XL) 300 MG PO TB24
300.0000 mg | ORAL_TABLET | Freq: Every day | ORAL | 0 refills | Status: DC
  Filled 2023-04-29 – 2023-05-29 (×2): qty 30, 30d supply, fill #0

## 2023-04-29 MED ORDER — TOPIRAMATE ER 50 MG PO CAP24
50.0000 mg | ORAL_CAPSULE | Freq: Every day | ORAL | 0 refills | Status: DC
  Filled 2023-04-29 – 2023-05-29 (×2): qty 30, 30d supply, fill #0

## 2023-04-29 MED ORDER — SERTRALINE HCL 50 MG PO TABS
50.0000 mg | ORAL_TABLET | Freq: Every day | ORAL | 0 refills | Status: DC
  Filled 2023-04-29 – 2023-05-29 (×2): qty 30, 30d supply, fill #0

## 2023-04-29 NOTE — Progress Notes (Signed)
 Donna Bond, D.O.  ABFM, ABOM Specializing in Clinical Bariatric Medicine  Office located at: [address] [place], [place]  [address]   Assessment and Plan:   FOR THE DISEASE OF OBESITY: BMI 50.0-59.9, adult (HCC)-current bmi 54.67 Morbid obesity (HCC)-start bmi 59.44/date 05/1720 Assessment & Plan: Since last office visit on 03/04/23 patient's  Muscle mass has increased by 0.4 lb. Fat mass has increased by 3.4 lb. Total body water has increased by 0.8 lb.  Counseling done on how various foods will affect these numbers and how to maximize success  Total lbs lost to date: 26 lbs  Total weight loss percentage to date: 8.00%    Recommended Dietary Goals Donna Bond is currently in the action stage of change. As such, her goal is to continue weight management plan.  She has agreed to: continue current plan   Behavioral Intervention We discussed the following today: work on managing stress, creating time for self-care and relaxation and continue to work on implementation of reduced calorie nutritional plan  Additional resources provided today: None  Evidence-based interventions for health behavior change were utilized today including the discussion of self monitoring techniques, problem-solving barriers and SMART goal setting techniques.   Regarding patient's less desirable eating habits and patterns, we employed the technique of small changes.   Pt will specifically work on: n/a    Recommended Physical Activity Goals Donna Bond has been advised to work up to 150 minutes of moderate intensity aerobic activity a week and strengthening exercises 2-3 times per week for cardiovascular health, weight loss maintenance and preservation of muscle mass.   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 Since last OV on 03/04/23, pt's mood has been sub-optimally controlled. Pt does not desire to adjust medication doses today. She will continue both mood medications and focus on diet, moving more, meditation/prayer, and contacting PCP about sleep study.   Orders: -     buPROPion  HCl ER (XL); Take 1 tablet (300 mg total) by mouth daily.  Dispense: 30 tablet; Refill: 0 -     Sertraline  HCl; Take 1 tablet (50 mg total) by mouth daily.  Dispense: 30 tablet; Refill: 0   Cluster headache, not intractable, unspecified chronicity pattern Assessment & Plan: Headaches are stable on Trokendi  XR 50 mg daily. No complaints of hunger or cravings today. Will refill medication today- continue at current dose.   Orders: -     Topiramate  ER; Take 1 capsule (50 mg total) by mouth daily.  Dispense: 30 capsule; Refill: 0   Other iron  deficiency anemia Assessment & Plan: Pt endorses not taking her iron  supplement. Will recheck labs today. Pt encouraged to take iron  supplement as prescribed and continue to focus on iron -rich foods such as dark leafy greens, red and white meats, eggs, seafood, and beans.  Orders: -     Vitamin B12 -     Transferrin -     CBC with Differential/Platelet -     Iron , TIBC and Ferritin Panel   Prediabetes Assessment & Plan: Most recent Hemoglobin A1c of 5.9 on 12/09/22. No current meds. Diet/exercise approach. Her hunger and cravings are better controlled when  following her prudent nutritional plan. Continue weight loss therapy. Recheck labs today.   Orders: -     Lipid panel -     Comprehensive metabolic panel -     Hemoglobin A1c -     Insulin , random   Vitamin D  deficiency Assessment & Plan: She is occasionally taking her prescribed high-dose vitamin D . Continue current  supplementation regiment - pt reminded to take on a consistent basis. Will recheck levels today.   Orders:  -     Vitamin D  (Ergocalciferol ); Take 1 capsule (50,000 Units total) by mouth every 6 days  Dispense: 6 capsule; Refill: 0 -     VITAMIN D  25 Hydroxy (Vit-D Deficiency, Fractures)   Follow up:   Return 05/27/2023. She was informed of the importance of frequent follow up visits to maximize her success with intensive lifestyle modifications for her multiple health conditions.  Donna Bond is aware that we will review all of her lab results at our next visit.  She is aware that if anything is critical/ life threatening with the results, we will be contacting her via MyChart prior to the office visit to discuss management.    Subjective:   Chief complaint: Obesity Donna Bond is here to discuss her progress with her obesity treatment plan. She is on the Category 2 Plan with B/L options and keeping a food journal and adhering to recommended goals of 1100-1200 calories and 85+ protein and states she is following her eating plan approximately 42% of the time. She states she is not exercising.   Interval History:  Donna Bond is here for a follow up office visit. Since last OV, Donna Bond is up 4 lbs. She endorses eating foods rich in sugars and fats over the holiday period. She is relatively back on track with her meal plan. She has been using her crock-pot, pressure cooker, and air fryer more because her stove broke.   Pharmacotherapy for weight loss: She is currently taking  Wellbutrin  XL 300 mg daily and Trokendi  XR 50 mg daily .   Review of Systems:  Pertinent positives were addressed with patient today.  Reviewed by clinician on day of visit: allergies, medications, problem list, medical history, surgical history, family history, social history, and previous encounter notes.  Weight Summary and Biometrics   Weight Lost Since Last Visit: 0lb  Weight Gained Since Last Visit: 4lb   Vitals Temp:  98.6 F (37 C) BP: 130/82 Pulse Rate: 92 SpO2: 97 %   Anthropometric Measurements Height: 5' 2 (1.575 m) Weight: 299 lb (135.6 kg) BMI (Calculated): 54.67 Weight at Last Visit: 295lb Weight Lost Since Last Visit: 0lb Weight Gained Since Last Visit: 4lb Starting Weight: 325lb Total Weight Loss (lbs): 26 lb (11.8 kg) Peak Weight: 325lb   Body Composition  Body Fat %: 55.4 % Fat Mass (lbs): 166 lbs Muscle Mass (lbs): 126.8 lbs Total Body Water (lbs): 98 lbs Visceral Fat Rating : 21   Other Clinical Data Fasting: no Labs: no Today's Visit #: 41 Starting Date: 06/08/20   Objective:   PHYSICAL EXAM: Blood pressure 130/82, pulse 92, temperature 98.6 F (37 C), height 5' 2 (1.575 m), weight 299 lb (135.6 kg), SpO2 97%. Body mass index is 54.69 kg/m.  General: she is overweight, cooperative and in no acute distress. PSYCH: Has normal mood, affect and thought process.   HEENT: EOMI, sclerae are anicteric. Lungs: Normal breathing effort, no conversational dyspnea. Extremities: Moves * 4 Neurologic: A and O * 3, good insight  DIAGNOSTIC DATA REVIEWED: BMET    Component Value Date/Time   NA 136 06/07/2022 2350   NA 139 03/25/2022 1015   K 4.1 06/07/2022 2350   CL 104 06/07/2022 2350   CO2 22 06/07/2022 2350   GLUCOSE 100 (H) 06/07/2022 2350   BUN 13 06/07/2022 2350   BUN 15 03/25/2022 1015   CREATININE 0.87 06/07/2022 2350   CALCIUM 8.8 (L) 06/07/2022 2350   GFRNONAA >60 06/07/2022 2350   GFRAA 114 06/08/2020 1015   Lab Results  Component Value Date   HGBA1C 5.9 (H) 12/09/2022   HGBA1C 5.8 06/10/2017   Lab Results  Component Value Date   INSULIN  6.1 03/25/2022   INSULIN  18.2 06/08/2020   Lab Results  Component Value Date   TSH 1.180 07/09/2021   CBC    Component Value Date/Time   WBC 11.0 (H) 12/09/2022 1206   WBC 15.8 (H) 06/08/2022 0120   RBC 5.54 (H) 12/09/2022 1206   RBC 5.14 (H) 06/08/2022 0120   HGB 12.3 12/09/2022 1206   HCT 40.2  12/09/2022 1206   PLT 296 12/09/2022 1206   MCV 73 (L) 12/09/2022 1206   MCH 22.2 (L) 12/09/2022 1206   MCH 22.0 (L) 06/08/2022 0120   MCHC 30.6 (L) 12/09/2022 1206   MCHC 31.1 06/08/2022 0120   RDW 17.8 (H) 12/09/2022 1206   Iron  Studies    Component Value Date/Time   IRON  36 10/09/2021 0828   TIBC 325 10/09/2021 0828   FERRITIN 145 10/09/2021 0828   IRONPCTSAT 11 (L) 10/09/2021 0828   Lipid Panel     Component Value Date/Time   CHOL 111 10/09/2021 0828   TRIG 48 10/09/2021 0828   HDL 40 10/09/2021 0828   CHOLHDL 2.8 10/09/2021 0828   LDLCALC 59 10/09/2021 0828   Hepatic Function Panel     Component Value Date/Time   PROT 7.6 03/25/2022 1015   ALBUMIN 4.1 03/25/2022 1015   AST 14 03/25/2022 1015   ALT 14 03/25/2022 1015   ALKPHOS 119 03/25/2022 1015   BILITOT 0.5 03/25/2022 1015      Component Value Date/Time   TSH 1.180 07/09/2021 0906   Nutritional Lab Results  Component Value Date   VD25OH 40.6 12/09/2022   VD25OH 31.3 03/25/2022   VD25OH 43.4 10/09/2021    Attestations:   I, Donna Bond, acting as a stage manager for [employer], Donna Bond., have compiled all relevant documentation for today's office visit on behalf of Donna Jenkins, Donna Bond, while in the presence of [employer], Donna Bond.  I have reviewed the above documentation for accuracy and completeness, and I agree with the above. Donna Bond, D.O.  The 21st Century Cures Act was signed into law in 2016 which includes the topic of electronic health records.  This provides immediate access to information in MyChart.  This includes consultation notes, operative notes, office notes, lab results and pathology reports.  If you have any questions about what you read please let us  know at your next visit so we can discuss your concerns and take corrective action if need be.  We are right here with you.

## 2023-04-30 ENCOUNTER — Other Ambulatory Visit: Payer: Self-pay

## 2023-04-30 ENCOUNTER — Other Ambulatory Visit (HOSPITAL_COMMUNITY): Payer: Self-pay

## 2023-05-12 ENCOUNTER — Other Ambulatory Visit (HOSPITAL_COMMUNITY): Payer: Self-pay

## 2023-05-27 ENCOUNTER — Ambulatory Visit (INDEPENDENT_AMBULATORY_CARE_PROVIDER_SITE_OTHER): Payer: Commercial Managed Care - PPO | Admitting: Family Medicine

## 2023-05-29 ENCOUNTER — Other Ambulatory Visit (HOSPITAL_COMMUNITY): Payer: Self-pay

## 2023-05-29 ENCOUNTER — Other Ambulatory Visit: Payer: Self-pay

## 2023-06-04 ENCOUNTER — Ambulatory Visit (HOSPITAL_BASED_OUTPATIENT_CLINIC_OR_DEPARTMENT_OTHER): Payer: Self-pay | Attending: Internal Medicine | Admitting: Internal Medicine

## 2023-06-04 ENCOUNTER — Encounter (HOSPITAL_BASED_OUTPATIENT_CLINIC_OR_DEPARTMENT_OTHER): Payer: Self-pay

## 2023-06-04 VITALS — Ht 62.0 in | Wt 296.0 lb

## 2023-06-04 DIAGNOSIS — R0683 Snoring: Secondary | ICD-10-CM

## 2023-06-07 NOTE — Procedures (Addendum)
     [facility] [address] [place], [place] [address] Tel: [phone]   Fax: [phone]  Polysomnography Interpretation  Patient Name:  Donna Bond, Donna Bond Date:  06/04/2023 Referring Physician:  -  Indications for Polysomnography The patient is a 43 year-old Female who is 5\' 2"  and weighs 296.0 lbs. Her BMI equals 54.5.  A full night polysomnogram was performed to evaluate for -.OSA  Polysomnogram Data A full night polysomnogram recorded the standard physiologic parameters including EEG, EOG, EMG, EKG, nasal and oral airflow.  Respiratory parameters of chest and abdominal movements were recorded with Respiratory Inductance Plethysmography belts.  Oxygen saturation was recorded by pulse oximetry.   Sleep Architecture The total recording time of the polysomnogram was 460.8 minutes.  The total sleep time was 370.0 minutes.  The patient spent 4.9% of total sleep time in Stage N1, 73.2% in Stage N2, 0.8% in Stages N3, and 21.1% in REM.  Sleep latency was 0.3 minutes.  REM latency was 233.0 minutes.  Sleep Efficiency was 80.3%.  Wake after Sleep Onset time was 91.0 minutes.  Respiratory Events The polysomnogram revealed a presence of - obstructive, - central, and - mixed apneas resulting in an Apnea index of - events per hour.  There were 77 hypopneas (>=3% desat and/or Ar.) resulting in an Apnea\Hypopnea Index (AHI >=3% desat and/or Ar.) of 12.5 events per hour.  There were 52 hypopneas (>=4% desat) resulting in an Apnea\Hypopnea Index (AHI >=4% desat) of 8.4 events per hour.  There were 35 Respiratory Effort Related Arousals resulting in a RERA index of 5.7 events per hour. The Respiratory Disturbance Index is 18.2 events per hour.   Mean oxygen saturation was 97.3%.  The lowest oxygen saturation during sleep was 91.0%.  Time spent <=88% oxygen saturation was - minutes (-).  End Tidal CO2 during sleep ranged from - to - mmHg. End Tidal CO2 was  greater than 50 mmHg for - minutes and greater than 55 mmHg for - minutes.  Limb Activity There were 137 total limb movements recorded, of this total, 83 were classified as PLMs.  PLM index was 13.5 per hour and PLM associated with Arousals index was 0.5 per hour.  Cardiac Summary The average pulse rate was 87.7 bpm.  The minimum pulse rate was 65.0 bpm while the maximum pulse rate was 114.0 bpm.  Cardiac rhythm was normal/abnormal.  Diagnosis: Mld obstructive sleep apnea, AHI (4%) 8.4/hr. Desaturation to a nadir of 91%.  Recommendations: Conservative management (observation, weight loss, sleep position off back) may be sufficient. Other options, including CPAP, a fitted orl appliance or ENT evaluation, would be based on clinical judgment.  -                                Ella Bodo, American Board of Sleep Medicine  ELECTRONICALLY SIGNED ON:  06/07/2023, 12:40 PM [facility] PH: [phone]   FX: [phone] ACCREDITED BY THE AMERICAN ACADEMY OF SLEEP MEDICINE

## 2023-06-17 ENCOUNTER — Encounter (HOSPITAL_BASED_OUTPATIENT_CLINIC_OR_DEPARTMENT_OTHER): Payer: 59 | Admitting: Internal Medicine

## 2023-06-21 ENCOUNTER — Encounter: Payer: Self-pay | Admitting: Internal Medicine

## 2023-06-21 DIAGNOSIS — G4733 Obstructive sleep apnea (adult) (pediatric): Secondary | ICD-10-CM

## 2023-06-23 ENCOUNTER — Ambulatory Visit (INDEPENDENT_AMBULATORY_CARE_PROVIDER_SITE_OTHER): Payer: Commercial Managed Care - PPO | Admitting: Family Medicine

## 2023-06-23 ENCOUNTER — Other Ambulatory Visit (HOSPITAL_COMMUNITY): Payer: Self-pay

## 2023-06-23 ENCOUNTER — Encounter (INDEPENDENT_AMBULATORY_CARE_PROVIDER_SITE_OTHER): Payer: Self-pay | Admitting: Family Medicine

## 2023-06-23 VITALS — BP 133/84 | HR 75 | Temp 98.7°F | Ht 62.0 in | Wt 300.0 lb

## 2023-06-23 DIAGNOSIS — G44009 Cluster headache syndrome, unspecified, not intractable: Secondary | ICD-10-CM | POA: Diagnosis not present

## 2023-06-23 DIAGNOSIS — F4323 Adjustment disorder with mixed anxiety and depressed mood: Secondary | ICD-10-CM

## 2023-06-23 DIAGNOSIS — E559 Vitamin D deficiency, unspecified: Secondary | ICD-10-CM

## 2023-06-23 DIAGNOSIS — Z6841 Body Mass Index (BMI) 40.0 and over, adult: Secondary | ICD-10-CM

## 2023-06-23 DIAGNOSIS — R7303 Prediabetes: Secondary | ICD-10-CM | POA: Diagnosis not present

## 2023-06-23 DIAGNOSIS — D508 Other iron deficiency anemias: Secondary | ICD-10-CM | POA: Diagnosis not present

## 2023-06-23 MED ORDER — VITAMIN D (ERGOCALCIFEROL) 1.25 MG (50000 UNIT) PO CAPS
50000.0000 [IU] | ORAL_CAPSULE | ORAL | 0 refills | Status: DC
  Filled 2023-06-23: qty 6, 42d supply, fill #0

## 2023-06-23 MED ORDER — SERTRALINE HCL 50 MG PO TABS
50.0000 mg | ORAL_TABLET | Freq: Every day | ORAL | 0 refills | Status: DC
  Filled 2023-06-23: qty 30, 30d supply, fill #0

## 2023-06-23 MED ORDER — TOPIRAMATE ER 50 MG PO CAP24
50.0000 mg | ORAL_CAPSULE | Freq: Every day | ORAL | 0 refills | Status: DC
  Filled 2023-06-23: qty 30, 30d supply, fill #0

## 2023-06-23 MED ORDER — METFORMIN HCL 500 MG PO TABS
ORAL_TABLET | ORAL | 0 refills | Status: DC
  Filled 2023-06-23: qty 30, 33d supply, fill #0

## 2023-06-23 MED ORDER — BUPROPION HCL ER (XL) 300 MG PO TB24
300.0000 mg | ORAL_TABLET | Freq: Every day | ORAL | 0 refills | Status: DC
Start: 2023-06-23 — End: 2023-07-29
  Filled 2023-06-23: qty 30, 30d supply, fill #0

## 2023-06-23 NOTE — Telephone Encounter (Signed)
 It does not look like it has been released

## 2023-06-23 NOTE — Progress Notes (Signed)
 Donna Bond, D.O.  ABFM, ABOM Specializing in Clinical Bariatric Medicine  Office located at: [address] [place], [place]  [address]   Assessment and Plan:   FOR THE DISEASE OF OBESITY: BMI 50.0-59.9, adult (HCC)-current bmi 54.86 Morbid obesity (HCC)-start bmi 59.44/date 05/1720 Assessment & Plan: Since last office visit on 04/29/23 patient's muscle mass has increased by 4 lb. Fat mass has decreased by 3 lb. Total body water has decreased by 3.4 lb.  Counseling done on how various foods will affect these numbers and how to maximize success  Total lbs lost to date: 25 lbs Total weight loss percentage to date: -7.68%    Recommended Dietary Goals Donna Bond is currently in the action stage of change. As such, her goal is to continue weight management plan.  She has agreed to: Change her meal plant to CAT 1 MP with 2-4 extra ounces of extra protein with lunch; pt also encouraged to journal if she desires.    Behavioral Intervention We discussed the following today: increasing lean protein intake to established goals, decreasing simple carbohydrates , increasing water intake , practice mindfulness eating and understand the difference between hunger signals and cravings, and work on managing stress, creating time for self-care and relaxation  Additional resources provided today: None  Evidence-based interventions for health behavior change were utilized today including the discussion of self monitoring techniques, problem-solving barriers and SMART goal setting techniques.   Regarding patient's less desirable eating habits and patterns, we employed the technique of small changes.   Pt will specifically work on: n/a   Recommended Physical Activity Goals Donna Bond has been advised to work up to 150 minutes of moderate intensity aerobic activity a week and strengthening exercises 2-3 times per week for cardiovascular health, weight loss maintenance and preservation of muscle mass.    She has agreed to :  Think about enjoyable ways to increase daily physical activity and overcoming barriers to exercise and Increase physical activity in their day and reduce sedentary time (increase NEAT).   Pharmacotherapy We both agreed to: continue with nutritional and behavioral strategies, adequate clinical response to current dose, continue current regimen, and Start Metformin today   FOR ASSOCIATED CONDITIONS ADDRESSED TODAY: Vitamin D deficiency Assessment & Plan: Lab Results  Component Value Date   VD25OH 40.6 12/09/2022   VD25OH 31.3 03/25/2022   VD25OH 43.4 10/09/2021   Last Vit D was below goal. Pt is on ERGO 50K units once every 6 days. Good compliance and tolerance. No acute concerns today.   Continue with current supplementation, no changes made today. Will refill ERGO today. Will continue to monitor condition as it relates to her weight loss.   Orders: - Refill ERGO, no dose changes    Cluster headache, not intractable, unspecified chronicity pattern Assessment & Plan: Pt is on Trokendi XR 50 mg once daily for management of her cluster headaches. Tolerating well with no SE reported. Pt is still experiencing headaches every 2 weeks. Trokendi tends to help manage her headaches, but has not noticed any improvements with hunger/cravings. Instead, she reports an increase in hunger and cravings.   Discussed how Topiramate will work synergetically with Metformin. Pt to continue taking Topiramate as prescribed, no changes to dosage. Will continue to monitor condition.   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 Pt is on Wellbutrin XL 300 mg once  daily and Zoloft 50 mg once daily. Tolerating meds well, no SE. She tends to plays games on her phone and meditates to de-stress. Hunger/cravings are uncontrolled.   Reviewed the  importance of stress management to her overall health and weight loss. Will continue to monitor condition. Continue with current medication regimen as prescribed. Will refill Wellbutrin with no dose changes today.   Orders: - Refill Wellbutrin XL, no dose changes - Refill Zoloft, no dose changes   Prediabetes Assessment & Plan: Lab Results  Component Value Date   HGBA1C 5.9 (H) 12/09/2022   HGBA1C 5.5 03/25/2022   HGBA1C 5.4 10/09/2021   INSULIN 6.1 03/25/2022   INSULIN 9.7 10/09/2021   INSULIN 15.5 07/09/2021   No meds currently. Diet/exercise approach. Hunger/cravings are uncontrolled. Pt has not been on Metformin in the past.   Discussed the option of starting Metformin and reviewed the risks and benefits with patient. We mutually agreed to start [name] on Metformin today. I recommend she start by taking 250 mg once daily with lunch for 6 days and if tolerating well, increase dose to 500 mg once daily. Reviewed how Topiramate and Metformin can work synergetically to  help improve control of hunger and cravings. Will continue to monitor condition.   Orders: - Start Metformin today (see above)   Follow up:   Return in about 4 weeks (around 07/21/2023) for 1 month f/u. She was informed of the importance of frequent follow up visits to maximize her success with intensive lifestyle modifications for her multiple health conditions.  Subjective:   Chief complaint: Obesity Donna Bond is here to discuss her progress with her obesity treatment plan. She is on the Category 2 Plan and keeping a food journal and adhering to recommended goals of 1100-1200 calories and 85+ g of protein and states she is following her eating plan approximately 50% of the time. She states she is not exercising.  Interval History:  Donna Bond is here for a follow up office visit. Since last OV on 04/29/23, she has gained 1 lb. Pt has been eating foods that are off plan, "whatever is most convenient", given she has been  primarily focused on caring for her son who had a GI bug. She has not been journaling.   Pharmacotherapy for weight loss: She is currently taking Bupropion (single agent, off label use) with adequate clinical response  and without side effects. and Topiramate (off label use, single agent) with adequate clinical response  and without side effects..   Review of Systems:  Pertinent positives were addressed with patient today.  Reviewed by clinician on day of visit: allergies, medications, problem list, medical history, surgical history, family history, social history, and previous encounter notes.  Weight Summary and Biometrics   Weight Lost Since Last Visit: 0  Weight Gained Since Last Visit: 1 lb   Vitals Temp: 98.7 F (37.1 C) BP: 133/84 Pulse Rate: 75 SpO2: 98 %   Anthropometric Measurements Height: 5\' 2"  (1.575 m) Weight: 300 lb (136.1 kg) BMI (Calculated): 54.86 Weight at Last Visit: 299 lb Weight Lost Since Last Visit: 0 Weight Gained Since Last Visit: 1 lb Starting Weight: 325 lb Total Weight Loss (lbs): 25 lb (11.3 kg) Peak Weight: 325 lb   Body Composition  Body Fat %: 54.2 % Fat Mass (lbs): 163 lbs Muscle Mass (lbs): 130.8 lbs Total Body Water (lbs): 94.6 lbs Visceral Fat Rating : 21   Other Clinical Data Fasting: No Labs: No Today's Visit #: 42 Starting Date: 06/08/20  Objective:   PHYSICAL EXAM: Blood pressure 133/84, pulse 75, temperature 98.7 F (37.1 C), height 5\' 2"  (1.575 m), weight 300 lb (136.1 kg), SpO2 98%. Body mass index is 54.87 kg/m.  General: she is overweight, cooperative and in no acute distress. PSYCH: Has normal mood, affect and thought process.   HEENT: EOMI, sclerae are anicteric. Lungs: Normal breathing effort, no conversational dyspnea. Extremities: Moves * 4 Neurologic: A and O * 3, good insight  DIAGNOSTIC DATA REVIEWED: BMET    Component Value Date/Time   NA 136 06/07/2022 2350   NA 139 03/25/2022 1015   K  4.1 06/07/2022 2350   CL 104 06/07/2022 2350   CO2 22 06/07/2022 2350   GLUCOSE 100 (H) 06/07/2022 2350   BUN 13 06/07/2022 2350   BUN 15 03/25/2022 1015   CREATININE 0.87 06/07/2022 2350   CALCIUM 8.8 (L) 06/07/2022 2350   GFRNONAA >60 06/07/2022 2350   GFRAA 114 06/08/2020 1015   Lab Results  Component Value Date   HGBA1C 5.9 (H) 12/09/2022   HGBA1C 5.8 06/10/2017   Lab Results  Component Value Date   INSULIN 6.1 03/25/2022   INSULIN 18.2 06/08/2020   Lab Results  Component Value Date   TSH 1.180 07/09/2021   CBC    Component Value Date/Time   WBC 11.0 (H) 12/09/2022 1206   WBC 15.8 (H) 06/08/2022 0120   RBC 5.54 (H) 12/09/2022 1206   RBC 5.14 (H) 06/08/2022 0120   HGB 12.3 12/09/2022 1206   HCT 40.2 12/09/2022 1206   PLT 296 12/09/2022 1206   MCV 73 (L) 12/09/2022 1206   MCH 22.2 (L) 12/09/2022 1206   MCH 22.0 (L) 06/08/2022 0120   MCHC 30.6 (L) 12/09/2022 1206   MCHC 31.1 06/08/2022 0120   RDW 17.8 (H) 12/09/2022 1206   Iron Studies    Component Value Date/Time   IRON 36 10/09/2021 0828   TIBC 325 10/09/2021 0828   FERRITIN 145 10/09/2021 0828   IRONPCTSAT 11 (L) 10/09/2021 0828   Lipid Panel     Component Value Date/Time   CHOL 111 10/09/2021 0828   TRIG 48 10/09/2021 0828   HDL 40 10/09/2021 0828   CHOLHDL 2.8 10/09/2021 0828   LDLCALC 59 10/09/2021 0828   Hepatic Function Panel     Component Value Date/Time   PROT 7.6 03/25/2022 1015   ALBUMIN 4.1 03/25/2022 1015   AST 14 03/25/2022 1015   ALT 14 03/25/2022 1015   ALKPHOS 119 03/25/2022 1015   BILITOT 0.5 03/25/2022 1015      Component Value Date/Time   TSH 1.180 07/09/2021 0906   Nutritional Lab Results  Component Value Date   VD25OH 40.6 12/09/2022   VD25OH 31.3 03/25/2022   VD25OH 43.4 10/09/2021    Attestations:   I, Isabelle Course, acting as a Stage manager for Thomasene Lot, DO., have compiled all relevant documentation for today's office visit on behalf of Thomasene Lot, DO, while in the presence of [employer], DO.  Reviewed by clinician on day of visit: allergies, medications, problem list, medical history, surgical history, family history, social history, and previous encounter notes pertinent to patient's obesity diagnosis.  I have reviewed the above documentation for accuracy and completeness, and I agree with the above. Donna Bond, D.O.  The 21st Century Cures Act was signed into law in 2016 which includes the topic of electronic health records.  This provides immediate access to information in MyChart.  This includes consultation notes, operative notes,  office notes, lab results and pathology reports.  If you have any questions about what you read please let us know at your next visit so we can discuss your concerns and take corrective action if need be.  We are right here with you.

## 2023-06-24 LAB — COMPREHENSIVE METABOLIC PANEL
ALT: 17 IU/L (ref 0–32)
AST: 20 IU/L (ref 0–40)
Albumin: 4.1 g/dL (ref 3.9–4.9)
Alkaline Phosphatase: 121 IU/L (ref 44–121)
BUN/Creatinine Ratio: 14 (ref 9–23)
BUN: 11 mg/dL (ref 6–24)
Bilirubin Total: 0.2 mg/dL (ref 0.0–1.2)
CO2: 19 mmol/L — ABNORMAL LOW (ref 20–29)
Calcium: 8.8 mg/dL (ref 8.7–10.2)
Chloride: 104 mmol/L (ref 96–106)
Creatinine, Ser: 0.81 mg/dL (ref 0.57–1.00)
Globulin, Total: 3.5 g/dL (ref 1.5–4.5)
Glucose: 75 mg/dL (ref 70–99)
Potassium: 4.5 mmol/L (ref 3.5–5.2)
Sodium: 141 mmol/L (ref 134–144)
Total Protein: 7.6 g/dL (ref 6.0–8.5)
eGFR: 93 mL/min/{1.73_m2} (ref >59)

## 2023-06-24 LAB — CBC WITH DIFFERENTIAL/PLATELET
Basophils Absolute: 0.1 10*3/uL (ref 0.0–0.2)
Basos: 1 %
EOS (ABSOLUTE): 0.1 10*3/uL (ref 0.0–0.4)
Eos: 1 %
Hematocrit: 43.3 % (ref 34.0–46.6)
Hemoglobin: 13.1 g/dL (ref 11.1–15.9)
Immature Grans (Abs): 0.1 10*3/uL (ref 0.0–0.1)
Immature Granulocytes: 1 %
Lymphocytes Absolute: 3.4 10*3/uL — ABNORMAL HIGH (ref 0.7–3.1)
Lymphs: 34 %
MCH: 21.4 pg — ABNORMAL LOW (ref 26.6–33.0)
MCHC: 30.3 g/dL — ABNORMAL LOW (ref 31.5–35.7)
MCV: 71 fL — ABNORMAL LOW (ref 79–97)
Monocytes Absolute: 0.5 10*3/uL (ref 0.1–0.9)
Monocytes: 5 %
Neutrophils Absolute: 5.8 10*3/uL (ref 1.4–7.0)
Neutrophils: 58 %
Platelets: 322 10*3/uL (ref 150–450)
RBC: 6.11 x10E6/uL — ABNORMAL HIGH (ref 3.77–5.28)
RDW: 15.7 % — ABNORMAL HIGH (ref 11.7–15.4)
WBC: 10 10*3/uL (ref 3.4–10.8)

## 2023-06-24 LAB — IRON,TIBC AND FERRITIN PANEL
Ferritin: 102 ng/mL (ref 15–150)
Iron Saturation: 16 % (ref 15–55)
Iron: 61 ug/dL (ref 27–159)
Total Iron Binding Capacity: 385 ug/dL (ref 250–450)
UIBC: 324 ug/dL (ref 131–425)

## 2023-06-24 LAB — VITAMIN D 25 HYDROXY (VIT D DEFICIENCY, FRACTURES): Vit D, 25-Hydroxy: 51.1 ng/mL (ref 30.0–100.0)

## 2023-06-24 LAB — LIPID PANEL
Chol/HDL Ratio: 3.4 ratio (ref 0.0–4.4)
Cholesterol, Total: 132 mg/dL (ref 100–199)
HDL: 39 mg/dL — ABNORMAL LOW (ref >39)
LDL Chol Calc (NIH): 78 mg/dL (ref 0–99)
Triglycerides: 72 mg/dL (ref 0–149)
VLDL Cholesterol Cal: 15 mg/dL (ref 5–40)

## 2023-06-24 LAB — HEMOGLOBIN A1C
Est. average glucose Bld gHb Est-mCnc: 117 mg/dL
Hgb A1c MFr Bld: 5.7 % — ABNORMAL HIGH (ref 4.8–5.6)

## 2023-06-24 LAB — TRANSFERRIN: Transferrin: 273 mg/dL (ref 192–364)

## 2023-06-24 LAB — INSULIN, RANDOM: INSULIN: 13.9 u[IU]/mL (ref 2.6–24.9)

## 2023-06-24 LAB — VITAMIN B12: Vitamin B-12: 709 pg/mL (ref 232–1245)

## 2023-06-26 ENCOUNTER — Encounter (INDEPENDENT_AMBULATORY_CARE_PROVIDER_SITE_OTHER): Payer: Self-pay | Admitting: Family Medicine

## 2023-07-16 NOTE — Telephone Encounter (Signed)
 Is there someone I can contact to check on this for the patient?

## 2023-07-17 NOTE — Telephone Encounter (Signed)
 Order placed let adapt know and patient thanks

## 2023-07-17 NOTE — Telephone Encounter (Signed)
 Done

## 2023-07-29 ENCOUNTER — Ambulatory Visit (INDEPENDENT_AMBULATORY_CARE_PROVIDER_SITE_OTHER): Admitting: Family Medicine

## 2023-07-29 ENCOUNTER — Encounter (INDEPENDENT_AMBULATORY_CARE_PROVIDER_SITE_OTHER): Payer: Self-pay | Admitting: Family Medicine

## 2023-07-29 ENCOUNTER — Other Ambulatory Visit (HOSPITAL_COMMUNITY): Payer: Self-pay

## 2023-07-29 VITALS — BP 118/67 | HR 94 | Temp 98.5°F | Ht 62.0 in | Wt 307.6 lb

## 2023-07-29 DIAGNOSIS — D508 Other iron deficiency anemias: Secondary | ICD-10-CM

## 2023-07-29 DIAGNOSIS — E786 Lipoprotein deficiency: Secondary | ICD-10-CM

## 2023-07-29 DIAGNOSIS — R7303 Prediabetes: Secondary | ICD-10-CM | POA: Diagnosis not present

## 2023-07-29 DIAGNOSIS — E559 Vitamin D deficiency, unspecified: Secondary | ICD-10-CM | POA: Diagnosis not present

## 2023-07-29 DIAGNOSIS — G44009 Cluster headache syndrome, unspecified, not intractable: Secondary | ICD-10-CM | POA: Diagnosis not present

## 2023-07-29 DIAGNOSIS — Z6841 Body Mass Index (BMI) 40.0 and over, adult: Secondary | ICD-10-CM | POA: Diagnosis not present

## 2023-07-29 DIAGNOSIS — F4323 Adjustment disorder with mixed anxiety and depressed mood: Secondary | ICD-10-CM | POA: Diagnosis not present

## 2023-07-29 MED ORDER — TOPIRAMATE ER 50 MG PO CAP24
50.0000 mg | ORAL_CAPSULE | Freq: Every day | ORAL | 1 refills | Status: DC
Start: 2023-07-29 — End: 2023-09-02
  Filled 2023-07-29: qty 30, 30d supply, fill #0

## 2023-07-29 MED ORDER — VITAMIN D (ERGOCALCIFEROL) 1.25 MG (50000 UNIT) PO CAPS
50000.0000 [IU] | ORAL_CAPSULE | ORAL | 0 refills | Status: DC
  Filled 2023-07-29: qty 13, 90d supply, fill #0

## 2023-07-29 MED ORDER — METFORMIN HCL 500 MG PO TABS
500.0000 mg | ORAL_TABLET | Freq: Every day | ORAL | 1 refills | Status: DC
  Filled 2023-07-29: qty 30, 30d supply, fill #0

## 2023-07-29 MED ORDER — FERROUS SULFATE 325 (65 FE) MG PO TABS
325.0000 mg | ORAL_TABLET | Freq: Two times a day (BID) | ORAL | Status: DC
Start: 2023-07-29 — End: 2023-10-22

## 2023-07-29 MED ORDER — SERTRALINE HCL 50 MG PO TABS
50.0000 mg | ORAL_TABLET | Freq: Every day | ORAL | 0 refills | Status: DC
Start: 2023-07-29 — End: 2023-09-02
  Filled 2023-07-29: qty 90, 90d supply, fill #0

## 2023-07-29 MED ORDER — BUPROPION HCL ER (XL) 300 MG PO TB24
300.0000 mg | ORAL_TABLET | Freq: Every day | ORAL | 0 refills | Status: DC
  Filled 2023-07-29: qty 90, 90d supply, fill #0

## 2023-07-29 NOTE — Progress Notes (Signed)
 Donna Bond, D.O.  ABFM, ABOM Specializing in Clinical Bariatric Medicine  Office located at: [address] [place], [place]  [address]   Assessment and Plan:   Medications Discontinued During This Encounter  Medication Reason   metFORMIN (GLUCOPHAGE) 500 MG tablet    ferrous sulfate 325 (65 FE) MG tablet Reorder   Vitamin D, Ergocalciferol, (DRISDOL) 1.25 MG (50000 UNIT) CAPS capsule Reorder   Topiramate ER (TROKENDI XR) 50 MG CP24 Reorder   sertraline (ZOLOFT) 50 MG tablet Reorder   buPROPion (WELLBUTRIN XL) 300 MG 24 hr tablet Reorder     Meds ordered this encounter  Medications   Vitamin D, Ergocalciferol, (DRISDOL) 1.25 MG (50000 UNIT) CAPS capsule    Sig: Take 1 capsule (50,000 Units total) by mouth every 6 days    Dispense:  13 capsule    Refill:  0   sertraline (ZOLOFT) 50 MG tablet    Sig: Take 1 tablet (50 mg total) by mouth daily. *Need to make appointment for any further refills*    Dispense:  90 tablet    Refill:  0    Ov for rf   buPROPion (WELLBUTRIN XL) 300 MG 24 hr tablet    Sig: Take 1 tablet (300 mg total) by mouth daily.    Dispense:  90 tablet    Refill:  0   metFORMIN (GLUCOPHAGE) 500 MG tablet    Sig: Take 1 tablet (500 mg total) by mouth daily with lunch. *Need appt for anymore refills*    Dispense:  30 tablet    Refill:  1    30 d supply;  ** OV for RF **   Do not send RF request   Topiramate ER (TROKENDI XR) 50 MG CP24    Sig: Take 1 capsule (50 mg total) by mouth daily.    Dispense:  30 capsule    Refill:  1   ferrous sulfate 325 (65 FE) MG tablet    Sig: Take 1 tablet (325 mg total) by mouth 2 (two) times daily with a meal.    FOR THE DISEASE OF OBESITY:  BMI 50.0-59.9, adult (HCC)-current bmi 56.25 Morbid obesity (HCC)-start bmi 59.44/date 05/1720 Assessment & Plan: Since last office visit on 06/23/2023 patient's  Muscle mass has decreased by 1 lb. Fat mass has increased by 8 lb. Total body water has increased by 4.8 lb.   Counseling done on how various foods will affect these numbers and how to maximize success  Total lbs lost to date: 18 lbs  Total weight loss percentage to date: 5.54%    Recommended Dietary Goals Donna Bond is currently in the action stage of change. As such, her goal is to continue weight management plan.  She has agreed to: continue current plan   Behavioral Intervention We discussed the following today: visceral adiposity, decreasing simple carbohydrates , work on meal planning and preparation, practice mindfulness eating and understand the difference between hunger signals and cravings, going to the [employer] for recipe ideas, and using GPT or another AI platform for recipe ideas- searching "low calorie, low carb, high protein chicken recipes" etc  Additional resources provided today: Handout on triggers for emotional eating,  Handout on practicing mindfulness around eating, Handout on recognition of cravings vs hunger; and emotional versus physical hunger, Handout on CAT 1 meal plan , Handout on CAT 1-2 breakfast options, Handout on CAT 1-2 lunch options, Handout on protein equivalents of 2 ounces of meat or seafood, and Handout on  recipe packet II  Evidence-based interventions for health behavior change were utilized today including the discussion of self monitoring techniques, problem-solving barriers and SMART goal setting techniques.   Regarding patient's less desirable eating habits and patterns, we employed the technique of small changes.   Pt will specifically work on: n/a   Recommended Physical Activity Goals Donna Bond has been advised to work up to 450 minutes of moderate intensity aerobic activity a week and strengthening exercises 2-3 times per week for cardiovascular health, weight loss maintenance and preservation of muscle mass.   She has agreed to :  swim at [facility] and continue walking regimen   Pharmacotherapy We both agreed to :  continue medication  regimen   FOR ASSOCIATED CONDITIONS ADDRESSED TODAY:   Prediabetes Assessment & Plan: Lab Results  Component Value Date   HGBA1C 5.7 (H) 06/23/2023   HGBA1C 5.9 (H) 12/09/2022   HGBA1C 5.5 03/25/2022   INSULIN 13.9 06/23/2023   INSULIN 6.1 03/25/2022   INSULIN 9.7 10/09/2021   Lab Results  Component Value Date   CREATININE 0.81 06/23/2023   BUN 11 06/23/2023   NA 141 06/23/2023   K 4.5 06/23/2023   CL 104 06/23/2023   CO2 19 (L) 06/23/2023      Component Value Date/Time   PROT 7.6 06/23/2023 1607   ALBUMIN 4.1 06/23/2023 1607   AST 20 06/23/2023 1607   ALT 17 06/23/2023 1607   ALKPHOS 121 06/23/2023 1607   BILITOT 0.2 06/23/2023 1607   Lab Results  Component Value Date   VITAMINB12 709 06/23/2023    Pt started Metformin about a week ago; she is taking one full tablet with lunch daily. Pt has a h/o loose stools however endorses that the Metformin has not caused any worsening GI issues. She feels the Metformin is helping abate her desire to snack. Lab wise, her Hemoglobin A1c improved from 5.9 to 5.7. Her fasting insulin has worsened from 6.1 to 13.9 (should be <5). Kidney function stable. Liver enzymes and B12 are within recommended limits.   Maintain with Metformin at current dose - reminded pt that the more she eats "off-plan", the more likely for her to have side effects of the drug. Continue balanced diet focusing on protein, fruits, and vegetables while limiting simple carbohydrates. Losing 10% or more of body weight may improve condition.   Low HDL (under 40) Assessment & Plan: Lab Results  Component Value Date   CHOL 132 06/23/2023   HDL 39 (L) 06/23/2023   LDLCALC 78 06/23/2023   TRIG 72 06/23/2023   CHOLHDL 3.4 06/23/2023   Lipid panel WNL w/ expection of HDL. Increasing exercise as able and consuming healthy fats can raise HDL. Continue prudent nutritional plan.    Cluster headache, not intractable, unspecified chronicity pattern Assessment &  Plan: Condition is controlled - she has no acute concerns today. Reports good compliance and tolerance of Trokendi XR 50 mg daily. She feels that her cravings are controlled when eating on plan. Continue regimen.    Other iron deficiency anemia Assessment & Plan: Lab Results  Component Value Date   WBC 10.0 06/23/2023   HGB 13.1 06/23/2023   HCT 43.3 06/23/2023   MCV 71 (L) 06/23/2023   PLT 322 06/23/2023   Lab Results  Component Value Date   IRON 61 06/23/2023   TIBC 385 06/23/2023   FERRITIN 102 06/23/2023    Reports compliance with  OTC Ferrous Sulfate 325 mg two times daily - occasionally she missed a  dose. Results above discussed with pt - her iron def labs were all WNL and her hemoglobin is at goal. Continue supplementation - recheck 3 mos.    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 States that her mood has been overall well controlled. She prays as an outlet. She has never explored counseling through her EAP. She has a h/o emotional eating tendencies and acknowledges doing increased boredom eating lately.  Patient was referred to Dr. Delaine Favorite, our Bariatric Psychologist. Provided pt on various mindful eating handouts. Encouraged 4-7-8 breathing for 10 minutes twice daily.    Vitamin D deficiency Assessment & Plan: Lab Results  Component Value Date   VD25OH 51.1 06/23/2023   VD25OH 40.6 12/09/2022   VD25OH 31.3 03/25/2022   Most recent Vitamin D is at goal. Pt reports compliance with strength vitamin D 50,000 units once every 6 days. Continue regimen - will recheck in 3 mos.    Follow up:   Return 09/02/2023. She was informed of the importance of frequent follow up visits to maximize her success with intensive lifestyle modifications for her multiple health conditions.  Subjective:   Chief complaint: Obesity Donna Bond is here to discuss her progress with  her obesity treatment plan. She is on the Category 1 Plan with B and L options and  2-4 extra ounces of protein at lunch. States she is following her eating plan approximately 50% of the time. She states she is walking 30 minutes 3 days per week.  Interval History:  Donna Bond is here for a follow up office visit. Since last OV on 06/23/2023, Donna Bond is up 7 lbs. Pt attributes this increase in weight to her actions the other 50% of the time when she was not following her plan. This included going out to eat and skipping dinners. Has an upcoming vacation to [facility].   Pharmacotherapy that aid weight loss: She is currently taking Metformin 500 mg one tablet with lunch, Wellbutrin XL 300 mg daily, and Trokendi XR 50 mg daily.   Review of Systems:  Pertinent positives were addressed with patient today.  Reviewed by clinician on day of visit: allergies, medications, problem list, medical history, surgical history, family history, social history, and previous encounter notes.  Weight Summary and Biometrics   Weight Lost Since Last Visit: 0  Weight Gained Since Last Visit: 7   Vitals Temp: 98.5 F (36.9 C) BP: 118/67 Pulse Rate: 94 SpO2: 100 %   Anthropometric Measurements Height: 5\' 2"  (1.575 m) Weight: (!) 307 lb 9.6 oz (139.5 kg) BMI (Calculated): 56.25 Weight at Last Visit: 300lb Weight Lost Since Last Visit: 0 Weight Gained Since Last Visit: 7 Starting Weight: 325lb Total Weight Loss (lbs): 18 lb (8.165 kg) Peak Weight: 325lb   Body Composition  Body Fat %: 55.6 % Fat Mass (lbs): 171 lbs Muscle Mass (lbs): 129.8 lbs Total Body Water (lbs): 99.4 lbs Visceral Fat Rating : 22   Other Clinical Data Fasting: No Labs: no Today's Visit #: 43 Starting Date: 06/08/20   Objective:   PHYSICAL EXAM: Blood pressure 118/67, pulse 94, temperature 98.5 F (36.9 C), height 5\' 2"  (1.575 m), weight (!) 307 lb 9.6 oz (139.5 kg), SpO2 100%. Body mass index is 56.26  kg/m.  General: she is overweight, cooperative and in no acute distress. PSYCH: Has normal mood, affect and thought process.   HEENT: EOMI, sclerae are anicteric. Lungs:  Normal breathing effort, no conversational dyspnea. Extremities: Moves * 4 Neurologic: A and O * 3, good insight  DIAGNOSTIC DATA REVIEWED: BMET    Component Value Date/Time   NA 141 06/23/2023 1607   K 4.5 06/23/2023 1607   CL 104 06/23/2023 1607   CO2 19 (L) 06/23/2023 1607   GLUCOSE 75 06/23/2023 1607   GLUCOSE 100 (H) 06/07/2022 2350   BUN 11 06/23/2023 1607   CREATININE 0.81 06/23/2023 1607   CALCIUM 8.8 06/23/2023 1607   GFRNONAA >60 06/07/2022 2350   GFRAA 114 06/08/2020 1015   Lab Results  Component Value Date   HGBA1C 5.7 (H) 06/23/2023   HGBA1C 5.8 06/10/2017   Lab Results  Component Value Date   INSULIN 13.9 06/23/2023   INSULIN 18.2 06/08/2020   Lab Results  Component Value Date   TSH 1.180 07/09/2021   CBC    Component Value Date/Time   WBC 10.0 06/23/2023 1607   WBC 15.8 (H) 06/08/2022 0120   RBC 6.11 (H) 06/23/2023 1607   RBC 5.14 (H) 06/08/2022 0120   HGB 13.1 06/23/2023 1607   HCT 43.3 06/23/2023 1607   PLT 322 06/23/2023 1607   MCV 71 (L) 06/23/2023 1607   MCH 21.4 (L) 06/23/2023 1607   MCH 22.0 (L) 06/08/2022 0120   MCHC 30.3 (L) 06/23/2023 1607   MCHC 31.1 06/08/2022 0120   RDW 15.7 (H) 06/23/2023 1607   Iron Studies    Component Value Date/Time   IRON 61 06/23/2023 1607   TIBC 385 06/23/2023 1607   FERRITIN 102 06/23/2023 1607   IRONPCTSAT 16 06/23/2023 1607   Lipid Panel     Component Value Date/Time   CHOL 132 06/23/2023 1607   TRIG 72 06/23/2023 1607   HDL 39 (L) 06/23/2023 1607   CHOLHDL 3.4 06/23/2023 1607   LDLCALC 78 06/23/2023 1607   Hepatic Function Panel     Component Value Date/Time   PROT 7.6 06/23/2023 1607   ALBUMIN 4.1 06/23/2023 1607   AST 20 06/23/2023 1607   ALT 17 06/23/2023 1607   ALKPHOS 121 06/23/2023 1607   BILITOT 0.2  06/23/2023 1607      Component Value Date/Time   TSH 1.180 07/09/2021 0906   Nutritional Lab Results  Component Value Date   VD25OH 51.1 06/23/2023   VD25OH 40.6 12/09/2022   VD25OH 31.3 03/25/2022    Attestations:   I, Donna Bond, acting as a Stage manager for [employer], DO., have compiled all relevant documentation for today's office visit on behalf of Donna Sensor, DO, while in the presence of [employer].  I have reviewed the above documentation for accuracy and completeness, and I agree with the above. Donna Bond, D.O.  The 21st Century Cures Act was signed into law in 2016 which includes the topic of electronic health records.  This provides immediate access to information in MyChart.  This includes consultation notes, operative notes, office notes, lab results and pathology reports.  If you have any questions about what you read please let us  know at your next visit so we can discuss your concerns and take corrective action if need be.  We are right here with you.

## 2023-08-12 ENCOUNTER — Telehealth (INDEPENDENT_AMBULATORY_CARE_PROVIDER_SITE_OTHER): Admitting: Psychology

## 2023-08-12 DIAGNOSIS — F439 Reaction to severe stress, unspecified: Secondary | ICD-10-CM

## 2023-08-12 DIAGNOSIS — F5089 Other specified eating disorder: Secondary | ICD-10-CM
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
## 2023-08-26 ENCOUNTER — Telehealth (INDEPENDENT_AMBULATORY_CARE_PROVIDER_SITE_OTHER): Admitting: Psychology

## 2023-08-26 DIAGNOSIS — F5089 Other specified eating disorder: Secondary | ICD-10-CM

## 2023-08-26 DIAGNOSIS — F439 Reaction to severe stress, unspecified: Secondary | ICD-10-CM

## 2023-08-26 NOTE — Progress Notes (Signed)
  Office: [phone]  /  Fax: [phone]    Date: Aug 26, 2023  Appointment Start Time: 11:04am Duration: 24 minutes Provider: Catherene Close, Psy.D. Type of Session: Individual Therapy  Location of Patient: Work (private location) Location of Provider: Provider's Home (private office) Type of Contact: Telepsychological Visit via MyChart Video Visit  Session Content: Donna Bond is a 43 y.o. female presenting for a follow-up appointment to address the previously established treatment goal of increasing coping skills.Today's appointment was a telepsychological visit. Donna Bond provided verbal consent for today's telepsychological appointment and she is aware she is responsible for securing confidentiality on her end of the session. Prior to proceeding with today's appointment, Donna Bond's physical location at the time of this appointment was obtained as well a phone number she could be reached at in the event of technical difficulties. Donna Bond and this provider participated in today's telepsychological service.   This provider conducted a brief check-in. Donna Bond reported having "to eat whatever is around" due to financial reasons. She agreed to discuss further with Dr. Carolene Chute during her appointment on 09/02/2023. Psychoeducation regarding triggers for emotional eating was provided. Donna Bond was provided a handout, and encouraged to utilize the handout between now and the next appointment to increase awareness of triggers and frequency. Donna Bond agreed. This provider also discussed behavioral strategies for specific triggers, such as placing the utensil down when conversing to avoid mindless eating. Donna Bond provided verbal consent during today's appointment for this provider to send a handout about triggers via e-mail. Overall, Donna Bond was receptive to today's appointment as evidenced by openness to sharing, responsiveness to feedback, and willingness to explore triggers for emotional eating.  Mental Status Examination:  Appearance:  neat Behavior: appropriate to circumstances Mood: neutral Affect: mood congruent Speech: WNL Eye Contact: appropriate Psychomotor Activity: WNL Gait: unable to assess Thought Process: linear, logical, and goal directed and no evidence or endorsement of suicidal, homicidal, and self-harm ideation, plan and intent  Thought Content/Perception: no hallucinations, delusions, bizarre thinking or behavior endorsed or observed Orientation: AAOx4 Memory/Concentration: intact Insight: fair Judgment: fair  Interventions:  Conducted a brief chart review Provided empathic reflections and validation Provided positive reinforcement Employed supportive psychotherapy interventions to facilitate reduced distress and to improve coping skills with identified stressors Psychoeducation provided regarding triggers for emotional eating behaviors  DSM-5 Diagnosis(es):  F50.89 Other Specified Feeding or Eating Disorder, Emotional Eating Behaviors and 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 Progress is limited, as Donna Bond has just begun treatment with this provider; however, she is receptive to the interaction and interventions and rapport is being established.   Plan: The next appointment is scheduled for 09/23/2022 at 11am, which will be via MyChart Video Visit. The next session will focus on working towards the established treatment goal. Donna Bond will initiate therapeutic services with [facility] on 09/12/2023.    Catherene Close, PsyD

## 2023-09-02 ENCOUNTER — Other Ambulatory Visit (HOSPITAL_COMMUNITY): Payer: Self-pay

## 2023-09-02 ENCOUNTER — Encounter (INDEPENDENT_AMBULATORY_CARE_PROVIDER_SITE_OTHER): Payer: Self-pay | Admitting: Family Medicine

## 2023-09-02 ENCOUNTER — Ambulatory Visit (INDEPENDENT_AMBULATORY_CARE_PROVIDER_SITE_OTHER): Admitting: Family Medicine

## 2023-09-02 VITALS — BP 135/82 | HR 64 | Temp 98.5°F | Ht 62.0 in | Wt 306.0 lb

## 2023-09-02 DIAGNOSIS — R7303 Prediabetes: Secondary | ICD-10-CM

## 2023-09-02 DIAGNOSIS — Z6841 Body Mass Index (BMI) 40.0 and over, adult: Secondary | ICD-10-CM

## 2023-09-02 DIAGNOSIS — G44009 Cluster headache syndrome, unspecified, not intractable: Secondary | ICD-10-CM | POA: Diagnosis not present

## 2023-09-02 DIAGNOSIS — G4719 Other hypersomnia: Secondary | ICD-10-CM | POA: Diagnosis not present

## 2023-09-02 DIAGNOSIS — F4323 Adjustment disorder with mixed anxiety and depressed mood: Secondary | ICD-10-CM | POA: Diagnosis not present

## 2023-09-02 MED ORDER — SERTRALINE HCL 50 MG PO TABS
75.0000 mg | ORAL_TABLET | Freq: Every day | ORAL | 0 refills | Status: DC
  Filled 2023-09-02 – 2023-10-08 (×2): qty 135, 90d supply, fill #0

## 2023-09-02 MED ORDER — METFORMIN HCL 500 MG PO TABS
500.0000 mg | ORAL_TABLET | Freq: Two times a day (BID) | ORAL | 0 refills | Status: DC
  Filled 2023-09-02 – 2023-10-08 (×2): qty 180, 90d supply, fill #0

## 2023-09-02 MED ORDER — TOPIRAMATE ER 50 MG PO CAP24
50.0000 mg | ORAL_CAPSULE | Freq: Every day | ORAL | 0 refills | Status: DC
  Filled 2023-09-02 – 2023-10-08 (×2): qty 90, 90d supply, fill #0

## 2023-09-02 NOTE — Progress Notes (Signed)
 Donna Bond, D.O.  ABFM, ABOM Specializing in Clinical Bariatric Medicine  Office located at: [address] [place], [place]  [address]   Assessment and Plan:  No orders of the defined types were placed in this encounter.   Medications Discontinued During This Encounter  Medication Reason   sertraline  (ZOLOFT ) 50 MG tablet    metFORMIN  (GLUCOPHAGE ) 500 MG tablet Reorder   Topiramate  ER (TROKENDI  XR) 50 MG CP24 Reorder     Meds ordered this encounter  Medications   sertraline  (ZOLOFT ) 50 MG tablet    Sig: Take 1 & 1/2 tablets (75 mg total) by mouth daily.    Dispense:  135 tablet    Refill:  0    Ov for rf   metFORMIN  (GLUCOPHAGE ) 500 MG tablet    Sig: Take 1 tablet (500 mg total) by mouth 2 (two) times daily with a meal. *Need appt for anymore refills*    Dispense:  180 tablet    Refill:  0    ** OV for RF **   Do not send RF request   Topiramate  ER (TROKENDI  XR) 50 MG CP24    Sig: Take 1 capsule (50 mg total) by mouth daily.    Dispense:  90 capsule    Refill:  0      FOR THE DISEASE OF OBESITY: BMI 50.0-59.9, adult (HCC) -- Current BMI 55.95 Morbid obesity (HCC)-start bmi 59.44/date 05/1720 Assessment & Plan: Since last office visit on 07/29/23 patient's muscle mass has increased by 0.8 lbs. Fat mass has decreased by 1.4 lbs. Total body water has decreased by 1.4 lbs.  Counseling done on how various foods will affect these numbers and how to maximize success  Total lbs lost to date: 19 lbs Total weight loss percentage to date: -5.85%    Recommended Dietary Goals Donna Bond is currently in the action stage of change. As such, her goal is to continue weight management plan.  She has agreed to: continue current plan   Behavioral Intervention We discussed the following today: work on managing stress, creating time for self-care and relaxation  Additional resources provided today: None  Evidence-based interventions for health behavior change were utilized  today including the discussion of self monitoring techniques, problem-solving barriers and SMART goal setting techniques.   Regarding patient's less desirable eating habits and patterns, we employed the technique of small changes.   Pt will specifically work on: Start exercising 3 times a week for 20 minutes and breathing exercises 5 minutes daily for next visit.    Recommended Physical Activity Goals Donna Bond has been advised to work up to 300-450 minutes of moderate intensity aerobic activity a week and strengthening exercises 2-3 times per week for cardiovascular health, weight loss maintenance and preservation of muscle mass.   She has agreed to :  Think about enjoyable ways to increase daily physical activity and overcoming barriers to exercise, Increase physical activity in their day and reduce sedentary time (increase NEAT)., and start exercising 3x/week    Pharmacotherapy We both agreed to: continue with nutritional and behavioral strategies and Increase Metformin  to 500 mg BID   ASSOCIATED CONDITIONS ADDRESSED TODAY: Prediabetes Assessment & Plan: Lab Results  Component Value Date   HGBA1C 5.7 (H) 06/23/2023   HGBA1C 5.9 (H) 12/09/2022   HGBA1C 5.5 03/25/2022   INSULIN  13.9 06/23/2023   INSULIN  6.1 03/25/2022   INSULIN  9.7 10/09/2021    Pt is on Metformin  500 mg once daily. Tolerating well with no  adverse SE. A1c improved from 5.9 in 11/2022 to 5.7 in 06/2023. Endorses cravings/hunger and would like to increase her Metformin  today. Mutually agreed to increase Metformin  to 500 mg BID with meals; I recommend lunch and dinner to help reduce hunger/cravings in the afternoon/evenings. Reviewed the benefits of Metformin  as well as potential risks/side effects with pt; pt verbalized understanding. Will continue monitoring condition as it pertains to her weight loss.   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 Denies any SI/HI. Pt is on Wellbutrin  XL 300 mg once daily and Zoloft  50 mg once daily. She feels her anxiety leads her to make poor eating decisions. Has been following up with Dr. Delaine Bond and also got an appt with [facility] psychologist for additional counseling. Has not been doing anything for stress management. Continue following up with Dr. Delaine Bond and other behavioral health specialist. Recommended stress management such as exercises, meditation, and 4-7-8 calm breathing exercise. Will increase Zoloft  today to 75 mg once daily. The goal is to eventually decrease Zoloft  and manage anxiety with exercise, f/ups with behavioral health, and other stress management methods. Pt agrees to this plan.   Orders: - Increase Zoloft  to 75 mg once daily   Excessive daytime sleepiness Assessment & Plan: Reports good sleep quality. States she is able to hear herself snore while sleeping. Pt had sleep study on 06/30/23, which revealed mild sleep apnea. After discussing with her pulmonologist, Dr. Linder Bond, CPAP was ordered however it was not fully covered by insurance. Per pt it would cost her $300 + a monthly fee, which are too high for her. See sleep study results below. Conservative management was recommended. Will continue to monitor condition as it relates to her wt loss journey.   "Respiratory Events The polysomnogram revealed a presence of - obstructive, - central, and - mixed apneas resulting in an Apnea index of - events per hour.  There were 77 hypopneas (>=3% desat and/or Ar.) resulting in an Apnea\Hypopnea Index (AHI >=3% desat and/or Ar.) of 12.5 events per hour.  There were 52 hypopneas (>=4% desat) resulting in an Apnea\Hypopnea Index (AHI >=4% desat) of 8.4 events per hour.  There were 35 Respiratory Effort Related Arousals resulting in a RERA index of 5.7 events per hour. The Respiratory Disturbance Index is 18.2 events per hour.    Mean oxygen  saturation was 97.3%.  The lowest oxygen saturation during sleep was 91.0%.  Time spent <=88% oxygen saturation was - minutes (-).   Diagnosis: Mld obstructive sleep apnea, AHI (4%) 8.4/hr. Desaturation to a nadir of 91%.   Recommendations: Conservative management (observation, weight loss, sleep position off back) may be sufficient. Other options, including CPAP, a fitted orl appliance or ENT evaluation, would be based on clinical judgment."    Cluster headache, not intractable, unspecified chronicity pattern Assessment & Plan: Pt is on Trokendi  XR 50 mg once weekly. Tolerating well with no adverse side effects. No acute concerns today.  Continue with current regimen. Will continue monitoring condition.    Follow up:   Return in about 6 weeks (around 10/14/2023).  She was informed of the importance of frequent follow up visits to maximize her success with intensive lifestyle modifications for her multiple health conditions.  Subjective:   Chief complaint: Obesity Jasma is here to discuss her progress with her obesity treatment plan. She is on the Category 1 Plan with  B and L options and  2-4 extra ounces of protein at lunch. States she is following her eating plan approximately 35% of the time. She states she is not exercising.  Interval History:  Charmel Pronovost is here for a follow up office visit. Since last OV on 07/29/23, she has lost 1 lb. Pt reports recent increased stress. She attributes poor eating habits to increased stress and anxiety.    Pharmacotherapy for weight loss: She is currently taking Metformin  (off label use for incretin effect and / or insulin  resistance and / or diabetes prevention) with adequate clinical response  and without side effects. and Bupropion  (single agent, off label use) with adequate clinical response  and without side effects..   Review of Systems:  Pertinent positives were addressed with patient today.  Reviewed by clinician on day of visit: allergies,  medications, problem list, medical history, surgical history, family history, social history, and previous encounter notes.  Weight Summary and Biometrics   Weight Lost Since Last Visit: 1lb  Weight Gained Since Last Visit: 0    Vitals Temp: 98.5 F (36.9 C) BP: 135/82 Pulse Rate: 64 SpO2: 99 %   Anthropometric Measurements Height: 5\' 2"  (1.575 m) Weight: (!) 306 lb (138.8 kg) BMI (Calculated): 55.95 Weight at Last Visit: 307lb Weight Lost Since Last Visit: 1lb Weight Gained Since Last Visit: 0 Starting Weight: 325lb Total Weight Loss (lbs): 19 lb (8.618 kg) Peak Weight: 325lb   Body Composition  Body Fat %: 55.3 % Fat Mass (lbs): 169.6 lbs Muscle Mass (lbs): 130 lbs Total Body Water (lbs): 98 lbs Visceral Fat Rating : 22   Other Clinical Data Fasting: no Labs: no Today's Visit #: 44 Starting Date: 06/08/20    Objective:   PHYSICAL EXAM: Blood pressure 135/82, pulse 64, temperature 98.5 F (36.9 C), height 5\' 2"  (1.575 m), weight (!) 306 lb (138.8 kg), SpO2 99%. Body mass index is 55.97 kg/m.  General: she is overweight, cooperative and in no acute distress. PSYCH: Has normal mood, affect and thought process.   HEENT: EOMI, sclerae are anicteric. Lungs: Normal breathing effort, no conversational dyspnea. Extremities: Moves * 4 Neurologic: A and O * 3, good insight  DIAGNOSTIC DATA REVIEWED: BMET    Component Value Date/Time   NA 141 06/23/2023 1607   K 4.5 06/23/2023 1607   CL 104 06/23/2023 1607   CO2 19 (L) 06/23/2023 1607   GLUCOSE 75 06/23/2023 1607   GLUCOSE 100 (H) 06/07/2022 2350   BUN 11 06/23/2023 1607   CREATININE 0.81 06/23/2023 1607   CALCIUM 8.8 06/23/2023 1607   GFRNONAA >60 06/07/2022 2350   GFRAA 114 06/08/2020 1015   Lab Results  Component Value Date   HGBA1C 5.7 (H) 06/23/2023   HGBA1C 5.8 06/10/2017   Lab Results  Component Value Date   INSULIN  13.9 06/23/2023   INSULIN  18.2 06/08/2020   Lab Results  Component  Value Date   TSH 1.180 07/09/2021   CBC    Component Value Date/Time   WBC 10.0 06/23/2023 1607   WBC 15.8 (H) 06/08/2022 0120   RBC 6.11 (H) 06/23/2023 1607   RBC 5.14 (H) 06/08/2022 0120   HGB 13.1 06/23/2023 1607   HCT 43.3 06/23/2023 1607   PLT 322 06/23/2023 1607   MCV 71 (L) 06/23/2023 1607   MCH 21.4 (L) 06/23/2023 1607   MCH 22.0 (L) 06/08/2022 0120   MCHC 30.3 (L) 06/23/2023 1607   MCHC 31.1 06/08/2022 0120   RDW 15.7 (H) 06/23/2023  1607   Iron  Studies    Component Value Date/Time   IRON  61 06/23/2023 1607   TIBC 385 06/23/2023 1607   FERRITIN 102 06/23/2023 1607   IRONPCTSAT 16 06/23/2023 1607   Lipid Panel     Component Value Date/Time   CHOL 132 06/23/2023 1607   TRIG 72 06/23/2023 1607   HDL 39 (L) 06/23/2023 1607   CHOLHDL 3.4 06/23/2023 1607   LDLCALC 78 06/23/2023 1607   Hepatic Function Panel     Component Value Date/Time   PROT 7.6 06/23/2023 1607   ALBUMIN 4.1 06/23/2023 1607   AST 20 06/23/2023 1607   ALT 17 06/23/2023 1607   ALKPHOS 121 06/23/2023 1607   BILITOT 0.2 06/23/2023 1607      Component Value Date/Time   TSH 1.180 07/09/2021 0906   Nutritional Lab Results  Component Value Date   VD25OH 51.1 06/23/2023   VD25OH 40.6 12/09/2022   VD25OH 31.3 03/25/2022    Attestations:   I, Bernita Bristle, acting as a medical scribe for Marceil Sensor, DO., have compiled all relevant documentation for today's office visit on behalf of Marceil Sensor, DO, while in the presence of [employer], DO.  Reviewed by clinician on day of visit: allergies, medications, problem list, medical history, surgical history, family history, social history, and previous encounter notes pertinent to patient's obesity diagnosis.  I have spent 40 minutes in the care of the patient today including: 34 minutes counseling on mood strategies to improve anxiety levels, improtance of exercise and lifestyle modifcations to improve these conditions as well as advice  on improving daytime sleepiness and quality of sleep. This also includes time preparing to see patient (e.g. review and interpretation of tests, old notes ), obtaining and/or reviewing separately obtained history, performing a medically appropriate examination or evaluation, counseling and educating the patient, ordering medications, test or procedures, documenting clinical information in the electronic or other health care record, and independently interpreting results and communicating results to the patient, family, or caregiver   I have reviewed the above documentation for accuracy and completeness, and I agree with the above. Donna Bond, D.O.  The 21st Century Cures Act was signed into law in 2016 which includes the topic of electronic health records.  This provides immediate access to information in MyChart.  This includes consultation notes, operative notes, office notes, lab results and pathology reports.  If you have any questions about what you read please let us  know at your next visit so we can discuss your concerns and take corrective action if need be.  We are right here with you.

## 2023-09-03 ENCOUNTER — Other Ambulatory Visit: Payer: Self-pay

## 2023-09-03 ENCOUNTER — Other Ambulatory Visit (HOSPITAL_COMMUNITY): Payer: Self-pay

## 2023-09-12 ENCOUNTER — Ambulatory Visit: Admitting: Psychology

## 2023-09-16 ENCOUNTER — Other Ambulatory Visit (HOSPITAL_COMMUNITY): Payer: Self-pay

## 2023-09-23 ENCOUNTER — Telehealth (INDEPENDENT_AMBULATORY_CARE_PROVIDER_SITE_OTHER): Admitting: Psychology

## 2023-09-23 DIAGNOSIS — F439 Reaction to severe stress, unspecified: Secondary | ICD-10-CM

## 2023-09-23 DIAGNOSIS — F5089 Other specified eating disorder: Secondary | ICD-10-CM

## 2023-09-23 NOTE — Progress Notes (Signed)
  Office: [phone]  /  Fax: [phone]    Date: September 23, 2023  Appointment Start Time: 11:02am Duration: 24 minutes Provider: Catherene Close, Psy.D. Type of Session: Individual Therapy  Location of Patient: Work (private location) Location of Provider: Provider's Home (private office) Type of Contact: Telepsychological Visit via MyChart Video Visit  Session Content: Donna Bond is a 43 y.o. female presenting for a follow-up appointment to address the previously established treatment goal of increasing coping skills. Today's appointment was a telepsychological visit. Donna Bond provided verbal consent for today's telepsychological appointment and she is aware she is responsible for securing confidentiality on her end of the session. Prior to proceeding with today's appointment, Donna Bond's physical location at the time of this appointment was obtained as well a phone number she could be reached at in the event of technical difficulties. Donna Bond and this provider participated in today's telepsychological service.   This provider conducted a brief check-in. Donna Bond stated, "I'm fine this morning and everything is fine." Nevertheless, she acknowledged engaging in "stress eating" and "sad eating" due to a death in the family. Notably, she discussed trying to focus on protein intake and making better choices despite the aforementioned. Donna Bond shared more specifically about her cousin's loss, and associated thoughts and feelings were processed. Since her cousin's passing, Donna Bond explained she is "trying to just get by." Psychoeducation regarding the importance of self-care utilizing the oxygen mask metaphor was provided. Psychoeducation regarding pleasurable activities, including its impact on emotional eating and overall well-being was also provided. Donna Bond was provided with a handout with various options of pleasurable activities, and was encouraged to engage in one activity a day and additional activities as needed when triggered to  emotionally eat. Donna Bond agreed. Donna Bond provided verbal consent during today's appointment for this provider to send a handout with activities via e-mail. Overall, Donna Bond was receptive to today's appointment as evidenced by openness to sharing, responsiveness to feedback, and willingness to engage in pleasurable activities to assist with coping.  Mental Status Examination:  Appearance: neat Behavior: appropriate to circumstances Mood: sad Affect: mood congruent Speech: WNL Eye Contact: appropriate Psychomotor Activity: WNL Gait: unable to assess Thought Process: linear, logical, and goal directed and no evidence or endorsement of suicidal, homicidal, and self-harm ideation, plan and intent  Thought Content/Perception: no hallucinations, delusions, bizarre thinking or behavior endorsed or observed Orientation: AAOx4 Memory/Concentration: intact Insight: fair Judgment: fair  Interventions:  Conducted a brief chart review Provided empathic reflections and validation Reviewed content from the previous session Provided positive reinforcement Employed supportive psychotherapy interventions to facilitate reduced distress and to improve coping skills with identified stressors Psychoeducation provided regarding pleasurable activities Psychoeducation provided regarding self-care  DSM-5 Diagnosis(es): F50.89 Other Specified Feeding or Eating Disorder, Emotional Eating Behaviors and 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 [name] has demonstrated progress in her goal as evidenced by increased awareness of hunger patterns and increased awareness of triggers for emotional eating behaviors. [name] also continues to demonstrate willingness to engage in learned skill(s).  Plan: The next appointment is scheduled for 10/14/2023 at 9:30am, which will be via  MyChart Video Visit. The next session will focus on working towards the established treatment goal. Due to a scheduling conflict, [facility] rescheduled her initial appointment to October 06, 2023.    Catherene Close, PsyD

## 2023-10-06 ENCOUNTER — Ambulatory Visit: Admitting: Licensed Clinical Social Worker

## 2023-10-08 ENCOUNTER — Other Ambulatory Visit (HOSPITAL_COMMUNITY): Payer: Self-pay

## 2023-10-09 ENCOUNTER — Other Ambulatory Visit (HOSPITAL_COMMUNITY): Payer: Self-pay

## 2023-10-09 ENCOUNTER — Other Ambulatory Visit: Payer: Self-pay

## 2023-10-14 ENCOUNTER — Encounter (INDEPENDENT_AMBULATORY_CARE_PROVIDER_SITE_OTHER): Admitting: Psychology

## 2023-10-14 ENCOUNTER — Ambulatory Visit: Admitting: Licensed Clinical Social Worker

## 2023-10-14 ENCOUNTER — Telehealth (INDEPENDENT_AMBULATORY_CARE_PROVIDER_SITE_OTHER): Payer: Self-pay | Admitting: Psychology

## 2023-10-14 NOTE — Telephone Encounter (Signed)
  Office: [phone]  /  Fax: [phone]  Date of Encounter: October 14, 2023  Time of Encounter: 9:33am Duration of Encounter: ~7.5 minute(s) Provider: Wyatt Fire, PsyD  CONTENT: Chan presented for today's appointment via MyChart Video Visit. Ahliyah shared she did not receive the previously shared handout via email; therefore, it was re-sent which she confirmed receiving today. She further shared about engagement in pleasurable activities (e.g., listening to music, reading). Additionally, she stated she continues to try to make better choices and engage in portion control. Of note, she shared she is scheduled for an initial therapy appointment with [facility] later today. This provider expressed concern regarding insurance coverage for two mental health appointments in one day and discussed options. Mitchelle acknowledged understanding and was receptive to rescheduling today's appointment with this provider as she disclosed she already had to reschedule with [facility] previously due to being late. No evidence or endorsement of safety concerns. All questions/concerns addressed.   PLAN: Mia is scheduled for an appointment on 10/20/2023 at 2pm with this provider via MyChart Video Visit.

## 2023-10-14 NOTE — Progress Notes (Signed)
 Entered in error

## 2023-10-20 ENCOUNTER — Telehealth (INDEPENDENT_AMBULATORY_CARE_PROVIDER_SITE_OTHER): Admitting: Psychology

## 2023-10-20 DIAGNOSIS — F439 Reaction to severe stress, unspecified: Secondary | ICD-10-CM

## 2023-10-20 DIAGNOSIS — F5089 Other specified eating disorder: Secondary | ICD-10-CM

## 2023-10-20 NOTE — Progress Notes (Signed)
  Office: [phone]  /  Fax: [phone]    Date: October 20, 2023  Appointment Start Time: 2:02pm Duration: 22 minutes Provider: Wyatt Fire, Psy.D. Type of Session: Individual Therapy  Location of Patient: Work (private location) Location of Provider: Provider's Home (private office) Type of Contact: Telepsychological Visit via MyChart Video Visit  Session Content: Donna Bond is a 43 y.o. female presenting for a follow-up appointment to address the previously established treatment goal of increasing coping skills.Today's appointment was a telepsychological visit. Carmisha provided verbal consent for today's telepsychological appointment and she is aware she is responsible for securing confidentiality on her end of the session. Prior to proceeding with today's appointment, [name]'s physical location at the time of this appointment was obtained as well a phone number she could be reached at in the event of technical difficulties. [name] and this provider participated in today's telepsychological service.   This provider conducted a brief check-in. [name] stated her [facility] appointment last week was canceled due to the provider being out sick. She noted a plan to call and reschedule. She shared further about recent events, including eating habits which she described as stable. She also described engaging in pleasurable activities (e.g., listening to music, reading the bible) to help her cope. Regarding emotional eating behaviors, she described an overall reduction, but noted small urges that are out of habit. Further explored and processed. Psychoeducation provided regarding urge surfing to further assist with emotional eating behaviors. [name] provided verbal consent during today's appointment for this provider to send a handout about urge surfing via e-mail. Overall, [name] was receptive to today's appointment as evidenced by openness to sharing, responsiveness to feedback, and willingness to  continue engaging in learned skills.  Mental Status Examination:  Appearance: neat Behavior: appropriate to circumstances Mood: neutral Affect: mood congruent Speech: WNL Eye Contact: appropriate Psychomotor Activity: WNL Gait: unable to assess Thought Process: linear, logical, and goal directed and no evidence or endorsement of suicidal, homicidal, and self-harm ideation, plan and intent  Thought Content/Perception: no hallucinations, delusions, bizarre thinking or behavior endorsed or observed Orientation: AAOx4 Memory/Concentration: intact Insight: good Judgment: good  Interventions:  Conducted a brief chart review Provided empathic reflections and validation Reviewed content from the previous session Provided positive reinforcement Employed supportive psychotherapy interventions to facilitate reduced distress and to improve coping skills with identified stressors Psychoeducation provided regarding urge surfing  DSM-5 Diagnosis(es): F50.89 Other Specified Feeding or Eating Disorder, Emotional Eating Behaviors and 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 [name] has demonstrated progress in her goal as evidenced by increased awareness of hunger patterns and increased awareness of triggers for emotional eating behaviors. [name] also continues to demonstrate willingness to engage in learned skill(s).  Plan: The next appointment is scheduled for 11/10/2023 at 2pm, which will be via MyChart Video Visit. The next session will focus on working towards the established treatment goal. [name] will call [facility] to reschedule.    Wyatt Fire, PsyD

## 2023-10-21 ENCOUNTER — Other Ambulatory Visit (HOSPITAL_COMMUNITY): Payer: Self-pay

## 2023-10-22 ENCOUNTER — Ambulatory Visit (INDEPENDENT_AMBULATORY_CARE_PROVIDER_SITE_OTHER): Admitting: Family Medicine

## 2023-10-22 ENCOUNTER — Other Ambulatory Visit (HOSPITAL_COMMUNITY): Payer: Self-pay

## 2023-10-22 ENCOUNTER — Encounter (INDEPENDENT_AMBULATORY_CARE_PROVIDER_SITE_OTHER): Payer: Self-pay | Admitting: Family Medicine

## 2023-10-22 ENCOUNTER — Other Ambulatory Visit: Payer: Self-pay

## 2023-10-22 DIAGNOSIS — D508 Other iron deficiency anemias: Secondary | ICD-10-CM | POA: Diagnosis not present

## 2023-10-22 DIAGNOSIS — E559 Vitamin D deficiency, unspecified: Secondary | ICD-10-CM | POA: Diagnosis not present

## 2023-10-22 DIAGNOSIS — F4323 Adjustment disorder with mixed anxiety and depressed mood: Secondary | ICD-10-CM | POA: Diagnosis not present

## 2023-10-22 DIAGNOSIS — R7303 Prediabetes: Secondary | ICD-10-CM | POA: Diagnosis not present

## 2023-10-22 DIAGNOSIS — Z6841 Body Mass Index (BMI) 40.0 and over, adult: Secondary | ICD-10-CM

## 2023-10-22 DIAGNOSIS — G44009 Cluster headache syndrome, unspecified, not intractable: Secondary | ICD-10-CM | POA: Diagnosis not present

## 2023-10-22 MED ORDER — SERTRALINE HCL 50 MG PO TABS
75.0000 mg | ORAL_TABLET | Freq: Every day | ORAL | 0 refills | Status: DC
  Filled 2023-10-22: qty 135, 90d supply, fill #0

## 2023-10-22 MED ORDER — VITAMIN D (ERGOCALCIFEROL) 1.25 MG (50000 UNIT) PO CAPS
50000.0000 [IU] | ORAL_CAPSULE | ORAL | 0 refills | Status: DC
Start: 2023-10-22 — End: 2024-01-06
  Filled 2023-10-22: qty 13, 90d supply, fill #0

## 2023-10-22 MED ORDER — FERROUS SULFATE 325 (65 FE) MG PO TABS: 325.0000 mg | ORAL_TABLET | Freq: Two times a day (BID) | ORAL | Status: DC

## 2023-10-22 MED ORDER — TOPIRAMATE ER 50 MG PO CAP24
50.0000 mg | ORAL_CAPSULE | Freq: Every day | ORAL | 0 refills | Status: DC
  Filled 2023-10-22 – 2023-11-11 (×3): qty 90, 90d supply, fill #0

## 2023-10-22 MED ORDER — BUPROPION HCL ER (XL) 300 MG PO TB24
300.0000 mg | ORAL_TABLET | Freq: Every day | ORAL | 0 refills | Status: DC
  Filled 2023-10-22: qty 90, 90d supply, fill #0

## 2023-10-22 MED ORDER — METFORMIN HCL 500 MG PO TABS
500.0000 mg | ORAL_TABLET | Freq: Two times a day (BID) | ORAL | 0 refills | Status: DC
  Filled 2023-10-22: qty 180, 90d supply, fill #0

## 2023-10-23 ENCOUNTER — Other Ambulatory Visit (HOSPITAL_COMMUNITY): Payer: Self-pay

## 2023-10-24 ENCOUNTER — Other Ambulatory Visit (HOSPITAL_COMMUNITY): Payer: Self-pay

## 2023-10-27 ENCOUNTER — Other Ambulatory Visit (HOSPITAL_COMMUNITY): Payer: Self-pay

## 2023-10-28 ENCOUNTER — Other Ambulatory Visit: Payer: Self-pay

## 2023-10-28 ENCOUNTER — Other Ambulatory Visit (HOSPITAL_COMMUNITY): Payer: Self-pay

## 2023-10-29 ENCOUNTER — Other Ambulatory Visit (HOSPITAL_COMMUNITY): Payer: Self-pay

## 2023-11-09 NOTE — Progress Notes (Signed)
 Donna Bond, D.O.  ABFM, ABOM Specializing in Clinical Bariatric Medicine  Office located at: [address] [place], [place]  [address]   Assessment and Plan:   Medications Discontinued During This Encounter  Medication Reason   Vitamin D , Ergocalciferol , (DRISDOL ) 1.25 MG (50000 UNIT) CAPS capsule Reorder   buPROPion  (WELLBUTRIN  XL) 300 MG 24 hr tablet Reorder   ferrous sulfate  325 (65 FE) MG tablet Reorder   sertraline  (ZOLOFT ) 50 MG tablet Reorder   metFORMIN  (GLUCOPHAGE ) 500 MG tablet Reorder   Topiramate  ER (TROKENDI  XR) 50 MG CP24 Reorder    Meds ordered this encounter  Medications   buPROPion  (WELLBUTRIN  XL) 300 MG 24 hr tablet    Sig: Take 1 tablet (300 mg total) by mouth daily.    Dispense:  90 tablet    Refill:  0   ferrous sulfate  325 (65 FE) MG tablet    Sig: Take 1 tablet (325 mg total) by mouth 2 (two) times daily with a meal.   metFORMIN  (GLUCOPHAGE ) 500 MG tablet    Sig: Take 1 tablet (500 mg total) by mouth 2 (two) times daily.    Dispense:  180 tablet    Refill:  0    ** OV for RF **   Do not send RF request   sertraline  (ZOLOFT ) 50 MG tablet    Sig: Take 1 & 1/2 tablets (75 mg total) by mouth daily.    Dispense:  135 tablet    Refill:  0    Ov for rf   Topiramate  ER (TROKENDI  XR) 50 MG CP24    Sig: Take 1 capsule (50 mg total) by mouth daily.    Dispense:  90 capsule    Refill:  0    Pt cannot swallow the zydus brand too big   Vitamin D , Ergocalciferol , (DRISDOL ) 1.25 MG (50000 UNIT) CAPS capsule    Sig: Take 1 capsule (50,000 Units total) by mouth every 6 days    Dispense:  13 capsule    Refill:  0      FOR THE DISEASE OF OBESITY: Morbid obesity (HCC)-start bmi 59.44/date 05/1720 BMI 50.0-59.9, adult (HCC) -- Current BMI 56.70 Assessment & Plan: Since last office visit on 09/02/23 patient's muscle mass has increased by 0.2 lbs. Fat mass has increased by 3.6 lbs. Total body water has increased by 1.6 lbs.  Counseling done on how  various foods will affect these numbers and how to maximize success  Total lbs lost to date: 15 lbs Total weight loss percentage to date: -4.62 %   Recommended Dietary Goals Donna Bond is currently in the action stage of change. As such, her goal is to continue weight management plan.  She has agreed to: continue current plan   Behavioral Intervention We discussed the following today: increasing lean protein intake to established goals, decreasing simple carbohydrates , increasing lower glycemic fruits, avoiding skipping meals, work on meal planning and preparation, keeping healthy foods at home, work on managing stress, creating time for self-care and relaxation, and continue to practice mindfulness when eating  Additional resources provided today: None  Evidence-based interventions for health behavior change were utilized today including the discussion of self monitoring techniques, problem-solving barriers and SMART goal setting techniques.   Regarding patient's less desirable eating habits and patterns, we employed the technique of small changes.   Recommended Physical Activity Goals Donna Bond has been advised to work up to 300-450 minutes of moderate intensity aerobic activity a week and strengthening  exercises 2-3 times per week for cardiovascular health, weight loss maintenance and preservation of muscle mass.   She has agreed to: Start exercising a minimum of 30 min 3 days   Pharmacotherapy We both agreed to: Continue with current nutritional and behavioral strategies and continue with Wellbutrin  and resume Metformin  and Trokendi  (ran out).    ASSOCIATED CONDITIONS ADDRESSED TODAY: Prediabetes Assessment & Plan: Lab Results  Component Value Date   HGBA1C 5.7 (H) 06/23/2023   HGBA1C 5.9 (H) 12/09/2022   HGBA1C 5.5 03/25/2022   INSULIN  13.9 06/23/2023   INSULIN  6.1 03/25/2022   INSULIN  9.7 10/09/2021    At LOV, Metformin  was increased to 500 mg BID. Not currently taking, pt she ran  out and requests a refill. Was able to feel clinical effect of Metformin  with hunger/cravings when she was taking. Good compliance and tolerance when taking.   Encouraged pt to decrease simple carb/sugar intake and increase protein intake. Reviewed how protein helps to stabilize sugars and with better control of hunger/cravings. Move protein intake to earlier in the day if unable to eat full 6-8 ounces at dinner. Advised pt to avoid skipping dinner.  Encouraged pt to work on regular physical activity throughout the week. Resume Metformin , will refill today.     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 Denies SI/HI. Pt on Wellbutrin  XL 300 mg once daily and Zoloft  50-75 mg once daily. Taking 50 mg of Zoloft  most days and taking 75 mg as needed for increased stress. Good tolerances to both meds. Pt requests a refill on Wellbutrin  and Zoloft . Endorses cravings throughout the day; well controlled at night. Tried 4-7-8- breathing exercise since LOV.   Encouraged pt to continue stress management strategies, such as 4-7-8 breathing exercises, physical activity/exercise, and meditation. Reminded pt of the importance of prioritizing self-care and her physical health. Continue with current med regimen; will refill Wellbutrin  and Zoloft  today.     Other iron  deficiency anemia Assessment & Plan: Lab Results  Component Value Date   IRON  61 06/23/2023   TIBC 385 06/23/2023   FERRITIN 102 06/23/2023   Pt is taking OTC iron  supplementation with food. Denies any constipation.   Stressed importance of taking iron  pills with food to avoid GI sx. Continue supplementation. Continue monitoring.     Cluster headache, not intractable, unspecified chronicity pattern Assessment & Plan: Pt on Trokendi  XR 50 mg once weekly. Ran out of her supply last week, but was previously compliant. Requests a refill today. Good tolerance and no SE  when taking. Has had a few headaches since her last visit. Endorses cravings.   Resume Trokendi  at current prescribed dose, will refill today. Reviewed additional clinical benefits of Trokendi  beyond migraine management; better control of hunger and cravings. Continue monitoring alongside PCP.     Vitamin D  deficiency Assessment & Plan: Lab Results  Component Value Date   VD25OH 51.1 06/23/2023   VD25OH 40.6 12/09/2022   VD25OH 31.3 03/25/2022   Pt is on ERGO 50k units once every 6 days. Good tolerance. No SE.   Continue supplementation as prescribed. Continue monitoring and recheck levels periodically.     Follow up:   Return in about 3 months (around 01/22/2024) for with Dr Midge. She was informed of the importance of frequent follow up visits to maximize her success with intensive lifestyle modifications for her multiple health conditions.  Subjective:   Chief complaint: Obesity Donna Bond is here to discuss her  progress with her obesity treatment plan. She is on the Category 1 Plan with B and L options and 6-8 ounces of protein at lunch. States she is following her eating plan approximately 15% of the time. She states she is not exercising.  Interval History:  Donna Bond is here for a follow up office visit. Since last OV on 5/13, she is up 4 lbs. She is overall feeling fine today. She endorses cravings throughout the day, stating they are well controlled at night. Has not meeting her protein goals on a daily basis, which she attributes to the hot weather. Also skips dinner, stating she does not want a full meal because it's hot. Last night, she only had chicken. This week she had watermelon for breakfast.   Pharmacotherapy that aid with weight loss: She is currently taking Wellbutrin  XR. States she ran out of Metformin  and Trokendi .   Review of Systems:  Pertinent positives were addressed with patient today.  Reviewed by clinician on day of visit: allergies, medications,  problem list, medical history, surgical history, family history, social history, and previous encounter notes.  Weight Summary and Biometrics   *See synopsis for biometric data.*  Objective:   PHYSICAL EXAM: Blood pressure 138/78, pulse 72, temperature 98.7 F (37.1 C), height 5' 2 (1.575 m), weight (!) 310 lb (140.6 kg), SpO2 100%. Body mass index is 56.7 kg/m.  General: she is overweight, cooperative and in no acute distress. PSYCH: Has normal mood, affect and thought process.   HEENT: EOMI, sclerae are anicteric. Lungs: Normal breathing effort, no conversational dyspnea. Extremities: Moves * 4 Neurologic: A and O * 3, good insight  DIAGNOSTIC DATA REVIEWED: BMET    Component Value Date/Time   NA 141 06/23/2023 1607   K 4.5 06/23/2023 1607   CL 104 06/23/2023 1607   CO2 19 (L) 06/23/2023 1607   GLUCOSE 75 06/23/2023 1607   GLUCOSE 100 (H) 06/07/2022 2350   BUN 11 06/23/2023 1607   CREATININE 0.81 06/23/2023 1607   CALCIUM 8.8 06/23/2023 1607   GFRNONAA >60 06/07/2022 2350   GFRAA 114 06/08/2020 1015   Lab Results  Component Value Date   HGBA1C 5.7 (H) 06/23/2023   HGBA1C 5.8 06/10/2017   Lab Results  Component Value Date   INSULIN  13.9 06/23/2023   INSULIN  18.2 06/08/2020   Lab Results  Component Value Date   TSH 1.180 07/09/2021   CBC    Component Value Date/Time   WBC 10.0 06/23/2023 1607   WBC 15.8 (H) 06/08/2022 0120   RBC 6.11 (H) 06/23/2023 1607   RBC 5.14 (H) 06/08/2022 0120   HGB 13.1 06/23/2023 1607   HCT 43.3 06/23/2023 1607   PLT 322 06/23/2023 1607   MCV 71 (L) 06/23/2023 1607   MCH 21.4 (L) 06/23/2023 1607   MCH 22.0 (L) 06/08/2022 0120   MCHC 30.3 (L) 06/23/2023 1607   MCHC 31.1 06/08/2022 0120   RDW 15.7 (H) 06/23/2023 1607   Iron  Studies    Component Value Date/Time   IRON  61 06/23/2023 1607   TIBC 385 06/23/2023 1607   FERRITIN 102 06/23/2023 1607   IRONPCTSAT 16 06/23/2023 1607   Lipid Panel     Component Value  Date/Time   CHOL 132 06/23/2023 1607   TRIG 72 06/23/2023 1607   HDL 39 (L) 06/23/2023 1607   CHOLHDL 3.4 06/23/2023 1607   LDLCALC 78 06/23/2023 1607   Hepatic Function Panel     Component Value Date/Time   PROT 7.6 06/23/2023  1607   ALBUMIN 4.1 06/23/2023 1607   AST 20 06/23/2023 1607   ALT 17 06/23/2023 1607   ALKPHOS 121 06/23/2023 1607   BILITOT 0.2 06/23/2023 1607      Component Value Date/Time   TSH 1.180 07/09/2021 0906   Nutritional Lab Results  Component Value Date   VD25OH 51.1 06/23/2023   VD25OH 40.6 12/09/2022   VD25OH 31.3 03/25/2022    Attestations:   I, Donna Bond, acting as a medical scribe for Donna Jenkins, DO., have compiled all relevant documentation for today's office visit on behalf of Donna Jenkins, DO, while in the presence of [employer], DO.  I have reviewed the above documentation for accuracy and completeness, and I agree with the above. Donna Bond, D.O.  The 21st Century Cures Act was signed into law in 2016 which includes the topic of electronic health records.  This provides immediate access to information in MyChart.  This includes consultation notes, operative notes, office notes, lab results and pathology reports.  If you have any questions about what you read please let us  know at your next visit so we can discuss your concerns and take corrective action if need be.  We are right here with you.

## 2023-11-10 ENCOUNTER — Telehealth (INDEPENDENT_AMBULATORY_CARE_PROVIDER_SITE_OTHER): Admitting: Psychology

## 2023-11-10 DIAGNOSIS — F5089 Other specified eating disorder: Secondary | ICD-10-CM

## 2023-11-10 DIAGNOSIS — F439 Reaction to severe stress, unspecified: Secondary | ICD-10-CM

## 2023-11-10 NOTE — Progress Notes (Signed)
  Office: [phone]  /  Fax: [phone]    Date: November 10, 2023  Appointment Start Time: 2:02pm Duration: 20 minutes Provider: Wyatt Fire, Psy.D. Type of Session: Individual Therapy  Location of Patient: Work (private location) Location of Provider: Provider's Home (private office) Type of Contact: Telepsychological Visit via MyChart Video Visit  Session Content: Ileah is a 43 y.o. female presenting for a follow-up appointment to address the previously established treatment goal of increasing coping skills. Today's appointment was a telepsychological visit. Roylene provided verbal consent for today's telepsychological appointment and she is aware she is responsible for securing confidentiality on her end of the session. Prior to proceeding with today's appointment, Sarahmarie's physical location at the time of this appointment was obtained as well a phone number she could be reached at in the event of technical difficulties. Jaida and this provider participated in today's telepsychological service.   This provider conducted a brief check-in. Thressa shared about recent events, noting she is trying to pray to help her cope with current stressors. She acknowledged some instances of engagement in emotional eating behaviors. Reviewed urge surfing. She shared examples of engaging in urge surfing and her experiences were processed. Psychoeducation provided regarding mindful eating strategies (sit down, slowly chew, savor, simplify, and smile). Overall, Maggi was receptive to today's appointment as evidenced by openness to sharing, responsiveness to feedback, and willingness to implement discussed strategies .  Mental Status Examination:  Appearance: neat Behavior: appropriate to circumstances Mood: neutral Affect: mood congruent Speech: WNL Eye Contact: appropriate Psychomotor Activity: WNL Gait: unable to assess Thought Process: linear, logical, and goal directed and no evidence or endorsement of suicidal,  homicidal, and self-harm ideation, plan and intent  Thought Content/Perception: no hallucinations, delusions, bizarre thinking or behavior endorsed or observed Orientation: AAOx4 Memory/Concentration: intact Insight: good Judgment: good  Interventions:  Conducted a brief chart review Provided empathic reflections and validation Reviewed content from the previous session Provided positive reinforcement Employed supportive psychotherapy interventions to facilitate reduced distress and to improve coping skills with identified stressors Psychoeducation provided regarding mindful eating strategies  DSM-5 Diagnosis(es): F50.89 Other Specified Feeding or Eating Disorder, Emotional Eating Behaviors and 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 Maria has demonstrated progress in her goal as evidenced by increased awareness of hunger patterns, increased awareness of triggers for emotional eating behaviors, and a reduction in emotional eating behaviors. Tecla also continues to demonstrate willingness to engage in learned skill(s).   Plan: The next appointment is scheduled for 12/01/2023 at 2pm, which will be via MyChart Video Visit. The next session will focus on working towards the established treatment goal and possible termination planning. Gionni is scheduled for an initial appointment with [facility] on November 26, 2023.   Wyatt Fire, PsyD

## 2023-11-11 ENCOUNTER — Other Ambulatory Visit (HOSPITAL_COMMUNITY): Payer: Self-pay

## 2023-11-20 ENCOUNTER — Other Ambulatory Visit (HOSPITAL_COMMUNITY): Payer: Self-pay

## 2023-11-26 ENCOUNTER — Ambulatory Visit: Admitting: Licensed Clinical Social Worker

## 2023-12-01 ENCOUNTER — Telehealth (INDEPENDENT_AMBULATORY_CARE_PROVIDER_SITE_OTHER): Admitting: Psychology

## 2023-12-01 ENCOUNTER — Telehealth (INDEPENDENT_AMBULATORY_CARE_PROVIDER_SITE_OTHER): Payer: Self-pay | Admitting: Psychology

## 2023-12-01 NOTE — Telephone Encounter (Signed)
  Office: [phone]  /  Fax: [phone]  Date of Call: December 01, 2023  Duration of Call: ~3 minute(s) Provider: Wyatt Fire, PsyD  CONTENT: This provider called Vaishali to check-in given recent job and insurance loss resulting in today's appointment being canceled. Aniyia was receptive to this provider sending referral options for therapeutic services offered with a slide scale fee. All questions/concerns addressed. No evidence or endorsement of any safety concerns.  PLAN: Referral options will be sent via e-mail. No further follow-up planned by this provider.

## 2023-12-24 ENCOUNTER — Ambulatory Visit (HOSPITAL_COMMUNITY)
Admission: RE | Admit: 2023-12-24 | Discharge: 2023-12-24 | Disposition: A | Discharge status: Home / Self Care | Payer: Self-pay | Source: Ambulatory Visit | Attending: Emergency Medicine | Admitting: Emergency Medicine

## 2023-12-24 ENCOUNTER — Other Ambulatory Visit: Payer: Self-pay

## 2023-12-24 ENCOUNTER — Encounter (HOSPITAL_COMMUNITY): Payer: Self-pay

## 2023-12-24 VITALS — BP 147/84 | HR 90 | Temp 98.2°F | Resp 20

## 2023-12-24 DIAGNOSIS — B9789 Other viral agents as the cause of diseases classified elsewhere: Secondary | ICD-10-CM

## 2023-12-24 DIAGNOSIS — R051 Acute cough: Secondary | ICD-10-CM

## 2023-12-24 DIAGNOSIS — J988 Other specified respiratory disorders: Secondary | ICD-10-CM

## 2023-12-24 LAB — POC COVID19/FLU A&B COMBO
Covid Antigen, POC: NEGATIVE
Influenza A Antigen, POC: NEGATIVE
Influenza B Antigen, POC: NEGATIVE

## 2023-12-24 NOTE — ED Triage Notes (Addendum)
 Complains of cough, cold chills, itchy throat and runny nose.  Also complains of headache and dizziness.  Symptoms started Monday night.    Has taken theraflu night time medicine, alka seltzer plus, drank ginger, lemon and honey

## 2023-12-24 NOTE — Discharge Instructions (Signed)
 Your COVID and flu testing are both negative today. I believe your symptoms are likely related to a viral respiratory illness Alternate between 650 mg of Tylenol  and 400 mg of ibuprofen  every 6-8 hours as needed for any pain or fever. You can take over-the-counter Mucinex as needed for cough and congestion. Make sure you are staying hydrated and getting plenty of rest. Follow-up with your primary care provider or return here as needed.
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
## 2023-12-31 ENCOUNTER — Other Ambulatory Visit (HOSPITAL_COMMUNITY): Payer: Self-pay

## 2023-12-31 MED ORDER — AZITHROMYCIN 250 MG PO TABS
ORAL_TABLET | ORAL | 0 refills | Status: AC
  Filled 2023-12-31: qty 6, 5d supply, fill #0

## 2024-01-06 ENCOUNTER — Ambulatory Visit (INDEPENDENT_AMBULATORY_CARE_PROVIDER_SITE_OTHER): Admitting: Family Medicine

## 2024-01-06 ENCOUNTER — Other Ambulatory Visit (HOSPITAL_COMMUNITY): Payer: Self-pay

## 2024-01-06 ENCOUNTER — Encounter (INDEPENDENT_AMBULATORY_CARE_PROVIDER_SITE_OTHER): Payer: Self-pay | Admitting: Family Medicine

## 2024-01-06 DIAGNOSIS — F4323 Adjustment disorder with mixed anxiety and depressed mood: Secondary | ICD-10-CM

## 2024-01-06 DIAGNOSIS — R7303 Prediabetes: Secondary | ICD-10-CM

## 2024-01-06 DIAGNOSIS — F5089 Other specified eating disorder: Secondary | ICD-10-CM

## 2024-01-06 DIAGNOSIS — G44009 Cluster headache syndrome, unspecified, not intractable: Secondary | ICD-10-CM | POA: Diagnosis not present

## 2024-01-06 DIAGNOSIS — E559 Vitamin D deficiency, unspecified: Secondary | ICD-10-CM

## 2024-01-06 DIAGNOSIS — Z6841 Body Mass Index (BMI) 40.0 and over, adult: Secondary | ICD-10-CM

## 2024-01-06 DIAGNOSIS — J3089 Other allergic rhinitis: Secondary | ICD-10-CM

## 2024-01-06 DIAGNOSIS — J302 Other seasonal allergic rhinitis: Secondary | ICD-10-CM

## 2024-01-06 DIAGNOSIS — D508 Other iron deficiency anemias: Secondary | ICD-10-CM

## 2024-01-06 DIAGNOSIS — G4733 Obstructive sleep apnea (adult) (pediatric): Secondary | ICD-10-CM

## 2024-01-06 MED ORDER — BUPROPION HCL ER (XL) 300 MG PO TB24
300.0000 mg | ORAL_TABLET | Freq: Every day | ORAL | 0 refills | Status: DC
  Filled 2024-01-06: qty 90, 90d supply, fill #0

## 2024-01-06 MED ORDER — LEVOCETIRIZINE DIHYDROCHLORIDE 5 MG PO TABS
5.0000 mg | ORAL_TABLET | Freq: Every evening | ORAL | 0 refills | Status: DC
  Filled 2024-01-06: qty 30, 30d supply, fill #0

## 2024-01-06 MED ORDER — METFORMIN HCL 500 MG PO TABS
500.0000 mg | ORAL_TABLET | Freq: Two times a day (BID) | ORAL | 0 refills | Status: DC
  Filled 2024-01-06: qty 180, 90d supply, fill #0

## 2024-01-06 MED ORDER — FERROUS SULFATE 325 (65 FE) MG PO TABS: 325.0000 mg | ORAL_TABLET | Freq: Two times a day (BID) | ORAL | Status: DC

## 2024-01-06 MED ORDER — VITAMIN D (ERGOCALCIFEROL) 1.25 MG (50000 UNIT) PO CAPS
50000.0000 [IU] | ORAL_CAPSULE | ORAL | 0 refills | Status: DC
  Filled 2024-01-06: qty 4, 28d supply, fill #0
  Filled 2024-03-03 – 2024-03-16 (×2): qty 4, 28d supply, fill #1

## 2024-01-06 MED ORDER — SERTRALINE HCL 50 MG PO TABS
50.0000 mg | ORAL_TABLET | Freq: Every day | ORAL | 0 refills | Status: DC
  Filled 2024-01-06: qty 90, 90d supply, fill #0

## 2024-01-06 MED ORDER — FLUTICASONE PROPIONATE 50 MCG/ACT NA SUSP
1.0000 | Freq: Two times a day (BID) | NASAL | 2 refills | Status: DC
  Filled 2024-01-06: qty 16, 30d supply, fill #0
  Filled 2024-03-03: qty 16, 30d supply, fill #1

## 2024-01-06 MED ORDER — TOPIRAMATE ER 50 MG PO CAP24
50.0000 mg | ORAL_CAPSULE | Freq: Every day | ORAL | 0 refills | Status: AC
  Filled 2024-01-06: qty 30, 30d supply, fill #0
  Filled 2024-03-03 – 2024-03-16 (×2): qty 30, 30d supply, fill #1

## 2024-01-06 NOTE — Progress Notes (Signed)
 Donna Bond, D.O.  ABFM, ABOM Specializing in Clinical Bariatric Medicine  Office located at: [address] [place], [place]  [address]   Assessment and Plan:    Medications Discontinued During This Encounter  Medication Reason   fluticasone  (FLONASE ) 50 MCG/ACT nasal spray    buPROPion  (WELLBUTRIN  XL) 300 MG 24 hr tablet Reorder   ferrous sulfate  325 (65 FE) MG tablet Reorder   metFORMIN  (GLUCOPHAGE ) 500 MG tablet Reorder   sertraline  (ZOLOFT ) 50 MG tablet Reorder   Topiramate  ER (TROKENDI  XR) 50 MG CP24 Reorder   Vitamin D , Ergocalciferol , (DRISDOL ) 1.25 MG (50000 UNIT) CAPS capsule Reorder     Meds ordered this encounter  Medications   buPROPion  (WELLBUTRIN  XL) 300 MG 24 hr tablet    Sig: Take 1 tablet (300 mg total) by mouth daily.    Dispense:  90 tablet    Refill:  0   metFORMIN  (GLUCOPHAGE ) 500 MG tablet    Sig: Take 1 tablet (500 mg total) by mouth 2 (two) times daily.    Dispense:  180 tablet    Refill:  0    ** OV for RF **   Do not send RF request   Topiramate  ER (TROKENDI  XR) 50 MG CP24    Sig: Take 1 capsule (50 mg total) by mouth daily.    Dispense:  90 capsule    Refill:  0    Pt cannot swallow the zydus brand too big   sertraline  (ZOLOFT ) 50 MG tablet    Sig: Take 1 tablet (50 mg total) by mouth daily.    Dispense:  90 tablet    Refill:  0    Ov for rf   ferrous sulfate  325 (65 FE) MG tablet    Sig: Take 1 tablet (325 mg total) by mouth 2 (two) times daily with a meal.   Vitamin D , Ergocalciferol , (DRISDOL ) 1.25 MG (50000 UNIT) CAPS capsule    Sig: Take 1 capsule (50,000 Units total) by mouth every 6 days    Dispense:  13 capsule    Refill:  0   levocetirizine (XYZAL ) 5 MG tablet    Sig: Take 1 tablet (5 mg total) by mouth every evening.    Dispense:  90 tablet    Refill:  0   fluticasone  (FLONASE ) 50 MCG/ACT nasal spray    Sig: Place 1 spray into both nostrils 2 (two) times daily.    Dispense:  16 g    Refill:  2      FOR THE  DISEASE OF OBESITY:  Morbid obesity (HCC)-start bmi 59.44/date 05/1720 BMI 50.0-59.9, adult (HCC) -- Current BMI 58.88 Assessment & Plan: Since last office visit on 10/22/23 patient's muscle mass has decreased by 1.8 lbs. Fat mass has increased by 14.4 lbs. Total body water (was not obtained today).  Body fat % has increased by 2.3 %. Counseling done on how various foods will affect these numbers and how to maximize success  Total lbs lost to date: - 3 lbs Total weight loss percentage to date: - 0.92 %  - Tracking Calories/Macros: no   - Eating More Whole Foods: yes  - Adequate Protein Intake: no - occasionally does but not consistently   - Adequate Water Intake: yes  - Skipping Meals: yes  - Sleeping 7-9 Hours/ Night: yes   Recommended Dietary Goals Donna Bond is currently in the action stage of change. As such, her goal is to continue weight management plan.  She has  agreed to: Category 1 Plan with B and L options and 6-8 ounces of protein at lunch.   Behavioral Intervention We discussed the following today: work on managing stress, creating time for self-care and relaxation  Additional resources provided today: Handout on CAT 1 meal plan , Handout on CAT 1-2 breakfast options, and Handout on CAT 1-2 lunch options  Evidence-based interventions for health behavior change were utilized today including the discussion of self monitoring techniques, problem-solving barriers and SMART goal setting techniques.   Regarding patient's less desirable eating habits and patterns, we employed the technique of small changes.   Goal(s) for next OV: n/a    Recommended Physical Activity Goals Donna Bond has been advised to work up to 300-450 minutes of moderate intensity aerobic activity a week and strengthening exercises 2-3 times per week for cardiovascular health, weight loss maintenance and preservation of muscle mass.   She has agreed to: Think about enjoyable ways to increase daily physical activity  and overcoming barriers to exercise and Increase physical activity in their day and reduce sedentary time (increase NEAT).   Pharmacotherapy We both agreed to: restart all medications (Zoloft  Trokendi  and Wellbutrin  XL)   ASSOCIATED CONDITIONS ADDRESSED TODAY:  Prediabetes Assessment & Plan Lab Results  Component Value Date   HGBA1C 5.7 (H) 06/23/2023   HGBA1C 5.9 (H) 12/09/2022   HGBA1C 5.5 03/25/2022   INSULIN  13.9 06/23/2023   INSULIN  6.1 03/25/2022   INSULIN  9.7 10/09/2021    On Metformin  500 mg BID. With good compliance and tolerance. No concerns today regarding Metformin .Will refill today. Continue taking. Encouraged to decrease simple carbs/sugar intake.     Adjustment disorder with mixed anxiety and depressed mood- emotional eating Assessment & Plan On Wellbutrin  XL 300 mg once daily. Pt states that she has not been taking it consistently. Will refill today. She is also on Zoloft  50-75 mg. She has not been taking this medication since mid August. She went to the beach and lost it around that time. Pt reports having some withdrawals as well as increase in cravings and hunger. She endorses being emotional since losing her job and mourning over that. She has been taking some time off and has not been eating on plan. Will refill today. Restart Zoloft : start with half a tab for 4-6 days then increase to 1 full tab let daily. Will reassess at next OV.      Cluster headache, not intractable, unspecified chronicity pattern Assessment & Plan Pt is not currently taking Trokendi  XR 50 mg once weekly since mid August as well since loss of medication. Will refill today. Restart Trokendi  at current prescribed dose. Will continue monitoring alongside PCP.     Other iron  deficiency anemia Assessment & Plan Lab Results  Component Value Date   IRON  61 06/23/2023   TIBC 385 06/23/2023   FERRITIN 102 06/23/2023  Not taking OTC supplements. Stressed importance of taking iron  pills with  food to avoid GI symptoms. Pt will restart supplementation. Will continue monitoring.    Vitamin D  deficiency Assessment & Plan Lab Results  Component Value Date   VD25OH 51.1 06/23/2023   VD25OH 40.6 12/09/2022   VD25OH 31.3 03/25/2022  Pt is not currently taking ERGO 50k units once every 6 days. Restart supplementation. Will refill today.     Environmental and seasonal allergies Assessment & Plan Pt reports having sinus congestion and post nasal drip. Was taking OTC Claritin . Will prescribe Xyzal  and Flonase  in order to help symptoms. Recommended pt to do sinus  rinses in order to help flush pollen out.     OSA (obstructive sleep apnea)- mild AHI 8.4/hr- no appliance or pap Assessment & Plan Reviewed Dr. Reggy notes from sleep study in February of '25. Pt has an AHI of 8.4 per hous with 54 apnea/hypopnea incidents. Pt is currently on no pap due to high costs that she is unable to cover for since her insurance does not fully cover the appliance. Encourage pt if sx continue to follow up.     Follow up:   Return 01/27/24 at 4:00 PM   She was informed of the importance of frequent follow up visits to maximize her success with intensive lifestyle modifications for her multiple health conditions.    Subjective:   Chief complaint: Obesity Donna Bond is here to discuss her progress with her obesity treatment plan. She is on the Category 1 Plan with B and L options and 6-8 ounces of protein at lunch.  and states she is following her eating plan approximately 10% of the time. Pt is walking 30 minutes 2 days per week    Interval History:  Donna Bond is here for a follow up office visit. Pt has experienced a weight gain of 12 lbs since last OV on 10/22/23. Pt states that she had her tooth pulled last week and has been on a soft diet due to this. She also has been skipping a mix of breakfast and lunch. Pt reports losing her job but is thinking about going back to school. In order to cope with  job loss she has been baking.     Pharmacotherapy that aid with weight loss: She is currently taking Metformin , Trokendi  and Wellbutrin  XR .    Review of Systems:  Pertinent positives were addressed with patient today.  Reviewed by clinician on day of visit: allergies, medications, problem list, medical history, surgical history, family history, social history, and previous encounter notes.  Weight Summary and Biometrics   No data recorded No data recorded   No data recorded Anthropometric Measurements Height: 5' 2 (1.575 m) Weight at Last Visit: 310lb Starting Weight: 325lb   No data recorded Other Clinical Data Fasting: no Labs: no Today's Visit #: 46 Starting Date: 06/08/20    Objective:   PHYSICAL EXAM: Height 5' 2 (1.575 m), last menstrual period 12/11/2023. Body mass index is 56.7 kg/m.  General: she is overweight, cooperative and in no acute distress. PSYCH: Has normal mood, affect and thought process.   HEENT: EOMI, sclerae are anicteric. Lungs: Normal breathing effort, no conversational dyspnea. Extremities: Moves * 4 Neurologic: A and O * 3, good insight  DIAGNOSTIC DATA REVIEWED: BMET    Component Value Date/Time   NA 141 06/23/2023 1607   K 4.5 06/23/2023 1607   CL 104 06/23/2023 1607   CO2 19 (L) 06/23/2023 1607   GLUCOSE 75 06/23/2023 1607   GLUCOSE 100 (H) 06/07/2022 2350   BUN 11 06/23/2023 1607   CREATININE 0.81 06/23/2023 1607   CALCIUM 8.8 06/23/2023 1607   GFRNONAA >60 06/07/2022 2350   GFRAA 114 06/08/2020 1015   Lab Results  Component Value Date   HGBA1C 5.7 (H) 06/23/2023   HGBA1C 5.8 06/10/2017   Lab Results  Component Value Date   INSULIN  13.9 06/23/2023   INSULIN  18.2 06/08/2020   Lab Results  Component Value Date   TSH 1.180 07/09/2021   CBC    Component Value Date/Time   WBC 10.0 06/23/2023 1607   WBC 15.8 (H) 06/08/2022 0120  RBC 6.11 (H) 06/23/2023 1607   RBC 5.14 (H) 06/08/2022 0120   HGB 13.1  06/23/2023 1607   HCT 43.3 06/23/2023 1607   PLT 322 06/23/2023 1607   MCV 71 (L) 06/23/2023 1607   MCH 21.4 (L) 06/23/2023 1607   MCH 22.0 (L) 06/08/2022 0120   MCHC 30.3 (L) 06/23/2023 1607   MCHC 31.1 06/08/2022 0120   RDW 15.7 (H) 06/23/2023 1607   Iron  Studies    Component Value Date/Time   IRON  61 06/23/2023 1607   TIBC 385 06/23/2023 1607   FERRITIN 102 06/23/2023 1607   IRONPCTSAT 16 06/23/2023 1607   Lipid Panel     Component Value Date/Time   CHOL 132 06/23/2023 1607   TRIG 72 06/23/2023 1607   HDL 39 (L) 06/23/2023 1607   CHOLHDL 3.4 06/23/2023 1607   LDLCALC 78 06/23/2023 1607   Hepatic Function Panel     Component Value Date/Time   PROT 7.6 06/23/2023 1607   ALBUMIN 4.1 06/23/2023 1607   AST 20 06/23/2023 1607   ALT 17 06/23/2023 1607   ALKPHOS 121 06/23/2023 1607   BILITOT 0.2 06/23/2023 1607      Component Value Date/Time   TSH 1.180 07/09/2021 0906   Nutritional Lab Results  Component Value Date   VD25OH 51.1 06/23/2023   VD25OH 40.6 12/09/2022   VD25OH 31.3 03/25/2022    Attestations:   I, Sonny Laroche, acting as a Stage manager for Donna Jenkins, DO., have compiled all relevant documentation for today's office visit on behalf of Donna Jenkins, DO, while in the presence of [employer], DO.    I have reviewed the above documentation for accuracy and completeness, and I agree with the above. Donna Bond, D.O.  The 21st Century Cures Act was signed into law in 2016 which includes the topic of electronic health records.  This provides immediate access to information in MyChart.  This includes consultation notes, operative notes, office notes, lab results and pathology reports.  If you have any questions about what you read please let us  know at your next visit so we can discuss your concerns and take corrective action if need be.  We are right here with you.

## 2024-01-14 LAB — HM MAMMOGRAPHY

## 2024-01-22 ENCOUNTER — Ambulatory Visit (INDEPENDENT_AMBULATORY_CARE_PROVIDER_SITE_OTHER): Admitting: Family Medicine

## 2024-01-23 ENCOUNTER — Other Ambulatory Visit (HOSPITAL_COMMUNITY): Payer: Self-pay

## 2024-01-27 ENCOUNTER — Other Ambulatory Visit (HOSPITAL_COMMUNITY): Payer: Self-pay

## 2024-01-27 ENCOUNTER — Ambulatory Visit (INDEPENDENT_AMBULATORY_CARE_PROVIDER_SITE_OTHER): Admitting: Family Medicine

## 2024-01-27 ENCOUNTER — Encounter (INDEPENDENT_AMBULATORY_CARE_PROVIDER_SITE_OTHER): Payer: Self-pay | Admitting: Family Medicine

## 2024-01-27 DIAGNOSIS — R7303 Prediabetes: Secondary | ICD-10-CM

## 2024-01-27 DIAGNOSIS — E559 Vitamin D deficiency, unspecified: Secondary | ICD-10-CM | POA: Diagnosis not present

## 2024-01-27 DIAGNOSIS — Z6841 Body Mass Index (BMI) 40.0 and over, adult: Secondary | ICD-10-CM

## 2024-01-27 DIAGNOSIS — J302 Other seasonal allergic rhinitis: Secondary | ICD-10-CM

## 2024-01-27 DIAGNOSIS — J3089 Other allergic rhinitis: Secondary | ICD-10-CM

## 2024-01-27 DIAGNOSIS — G44009 Cluster headache syndrome, unspecified, not intractable: Secondary | ICD-10-CM | POA: Diagnosis not present

## 2024-01-27 DIAGNOSIS — F4323 Adjustment disorder with mixed anxiety and depressed mood: Secondary | ICD-10-CM | POA: Diagnosis not present

## 2024-01-27 DIAGNOSIS — F5089 Other specified eating disorder: Secondary | ICD-10-CM

## 2024-01-27 DIAGNOSIS — D508 Other iron deficiency anemias: Secondary | ICD-10-CM

## 2024-01-27 MED ORDER — METFORMIN HCL ER 500 MG PO TB24
500.0000 mg | ORAL_TABLET | Freq: Every day | ORAL | 0 refills | Status: DC
  Filled 2024-01-27: qty 30, 30d supply, fill #0

## 2024-01-27 NOTE — Progress Notes (Signed)
 Barnie DOROTHA Jenkins, D.O.  ABFM, ABOM Specializing in Clinical Bariatric Medicine  Office located at: [address] [place], [place]  [address]      A) FOR THE CHRONIC DISEASE OF OBESITY:  Chief complaint: Obesity Donna Bond is here to discuss her progress with her obesity treatment plan.   History of present illness / Interval history:  Donna Bond is here today for her follow-up office visit.  Since last OV on 01/06/24, pt is the same weight. Pt states that she has been sleeping a lot more. She has been trying to get more exercise in and has a walking appointment tomorrow with her friend. She reports that she is going to the beach soon for vacation. She endorses feeling stressed but it not being overwhelming.    01/06/24 14:00 01/27/24 16:00   Body Fat % 58.1 % 55.3 %  Muscle Mass (lbs) 128.4 lbs 136.8 lbs  Fat Mass (lbs) 187.6 lbs 178.2 lbs  Total Body Water (lbs)  98.8 lbs  Visceral Fat Rating  24 23   Counseling done on how various foods will affect these numbers and how to maximize success   Total lbs lost to date: - 3 lbs Total Fat Mass in lbs lost to date:- 14.8 lbs Total weight loss percentage to date: - 0.92 %    Morbid obesity (HCC)-start bmi 59.44/date 05/1720 BMI 50.0-59.9, adult (HCC) -- Current BMI 58.88  Nutrition Therapy She is on the Category 1 Plan with B and L options and 6-8 ounces of protein at lunch and states she is following her eating plan approximately 50 % of the time.   - Tracking Calories/Macros: no   - Eating More Whole Foods: yes  - Adequate Protein Intake: yes  - Adequate Water Intake: no   - Skipping Meals: yes  - Sleeping 7-9 Hours/ Night: yes   Donna Bond is currently in the action stage of change. As such, her goal is to continue weight management plan.  She has agreed to: continue current plan   Physical Activity Pt is walking  30 minutes 2 days per week   Donna Bond has been advised to work up to 300-450 minutes of moderate intensity  aerobic activity a week and strengthening exercises 2-3 times per week for cardiovascular health, weight loss maintenance and preservation of muscle mass.  She has agreed to : Think about enjoyable ways to increase daily physical activity and overcoming barriers to exercise, Increase physical activity in their day and reduce sedentary time (increase NEAT)., Increase volume of physical activity to a goal of 240 minutes a week, and Combine aerobic and strengthening exercises for efficiency and improved cardiometabolic health.   Behavioral Modifications Evidence-based interventions for health behavior change were utilized today including the discussion of  1) self monitoring techniques:    -Be consistent 2) problem-solving barriers:    - Eat smaller meals throughout the day  3) self care:   4) SMART goals for next OV:    - Walk everyday for 30 min  Regarding patient's less desirable eating habits and patterns, we employed the technique of small changes.   We discussed the following today: increasing lean protein intake to established goals, work on meal planning and preparation, keeping healthy foods at home, and planning for success  Additional resources provided today: None   Medical Interventions/ Pharmacotherapy Previous Bariatric surgery: n/a Pharmacotherapy for weight loss: She is currently taking Metformin  500 mg daily, Trokendi  XR 50 mg daily and Wellbutrin  XL 300 mg  daily.   for medical weight loss.    We discussed various medication options to help Donna Bond with her weight loss efforts and we both agreed to : Continue with current nutritional and behavioral strategies   B) OBESITY RELATED CONDITIONS ADDRESSED TODAY:  Prediabetes Assessment & Plan Lab Results  Component Value Date   HGBA1C 5.7 (H) 06/23/2023   HGBA1C 5.9 (H) 12/09/2022   HGBA1C 5.5 03/25/2022   INSULIN  13.9 06/23/2023   INSULIN  6.1 03/25/2022   INSULIN  9.7 10/09/2021    Metformin  500 mg BID, pt states that  she only has been taking it once a day. Pt reports having some cravings but not as much and has good control hunger. Switch to Metformin  XR 500 mg once daily Continue to decrease simple carbs/ sugars; increase fiber and proteins -> follow her meal plan.      Vitamin D  deficiency Assessment & Plan Lab Results  Component Value Date   VD25OH 51.1 06/23/2023   VD25OH 40.6 12/09/2022   VD25OH 31.3 03/25/2022    Taking ERGO 50 K units every 6 days. Good compliance and tolerance. Continue supplementation.      Cluster headache, not intractable, unspecified chronicity pattern Assessment & Plan On Trokendi  XR 50 mg once daily. Good compliance and tolerance. Pt states that she has restarted the medication once again and she has had no issues since restarting. She mentions that her insurance has sent a letter about coverage but pt was unsure what it said as she was able to refill her prescription on 9/16. Recommended pt to scan in the document her insurance sent so we can see the information as well. Continue with medication.      Adjustment disorder with mixed anxiety and depressed mood- emotional eating Assessment & Plan Taking Zoloft  50 mg daily. Pt states that since she restarted the pill she has only been taking half a pill and recently went up to the whole pill this week Good compliance and tolerance. Pt is also taking Wellbutrin  XL 300 mg daily with good compliance and tolerance no acute concerns today. She states having good control over the emotional eating. Continue with medications and building strategies to help reduce emotional eating.     Environmental and seasonal allergies Assessment & Plan Pt states that since she started using Xyzal  she can notice a difference for the better then when she was using Claritin . She endorses feeling less congestion and better relief of her symptoms. Continue with medications as needed.  Medications Discontinued During This Encounter  Medication  Reason   metFORMIN  (GLUCOPHAGE ) 500 MG tablet Patient Preference     Meds ordered this encounter  Medications   metFORMIN  (GLUCOPHAGE -XR) 500 MG 24 hr tablet    Sig: Take 1 tablet (500 mg total) by mouth daily with breakfast.    Dispense:  30 tablet    Refill:  0      Follow up:   Return 02/10/2024 at 3:00 PM  She was informed of the importance of frequent follow up visits to maximize her success with intensive lifestyle modifications for her multiple health conditions.   Weight Summary and Biometrics   Weight Lost Since Last Visit: 0lb  Weight Gained Since Last Visit: 0lb    Vitals Temp: 98.2 F (36.8 C) BP: 120/74 Pulse Rate: 89 SpO2: 98 %   Anthropometric Measurements Height: 5' 2 (1.575 m) Weight: (!) 322 lb (146.1 kg) BMI (Calculated): 58.88 Weight at Last Visit: 322lb Weight Lost Since Last Visit: 0lb Weight Gained  Since Last Visit: 0lb Starting Weight: 325lb Total Weight Loss (lbs): 3 lb (1.361 kg)   Body Composition  Body Fat %: 55.3 % Fat Mass (lbs): 178.2 lbs Muscle Mass (lbs): 136.8 lbs Total Body Water (lbs): 98.8 lbs Visceral Fat Rating : 23   Other Clinical Data Fasting: no Labs: no Today's Visit #: 47 Starting Date: 06/08/20    Objective:   PHYSICAL EXAM: Blood pressure 120/74, pulse 89, temperature 98.2 F (36.8 C), height 5' 2 (1.575 m), weight (!) 322 lb (146.1 kg), SpO2 98%. Body mass index is 58.89 kg/m.  General: she is overweight, cooperative and in no acute distress. PSYCH: Has normal mood, affect and thought process.   HEENT: EOMI, sclerae are anicteric. Lungs: Normal breathing effort, no conversational dyspnea. Extremities: Moves * 4 Neurologic: A and O * 3, good insight  DIAGNOSTIC DATA REVIEWED: BMET    Component Value Date/Time   NA 141 06/23/2023 1607   K 4.5 06/23/2023 1607   CL 104 06/23/2023 1607   CO2 19 (L) 06/23/2023 1607   GLUCOSE 75 06/23/2023 1607   GLUCOSE 100 (H) 06/07/2022 2350   BUN 11  06/23/2023 1607   CREATININE 0.81 06/23/2023 1607   CALCIUM 8.8 06/23/2023 1607   GFRNONAA >60 06/07/2022 2350   GFRAA 114 06/08/2020 1015   Lab Results  Component Value Date   HGBA1C 5.7 (H) 06/23/2023   HGBA1C 5.8 06/10/2017   Lab Results  Component Value Date   INSULIN  13.9 06/23/2023   INSULIN  18.2 06/08/2020   Lab Results  Component Value Date   TSH 1.180 07/09/2021   CBC    Component Value Date/Time   WBC 10.0 06/23/2023 1607   WBC 15.8 (H) 06/08/2022 0120   RBC 6.11 (H) 06/23/2023 1607   RBC 5.14 (H) 06/08/2022 0120   HGB 13.1 06/23/2023 1607   HCT 43.3 06/23/2023 1607   PLT 322 06/23/2023 1607   MCV 71 (L) 06/23/2023 1607   MCH 21.4 (L) 06/23/2023 1607   MCH 22.0 (L) 06/08/2022 0120   MCHC 30.3 (L) 06/23/2023 1607   MCHC 31.1 06/08/2022 0120   RDW 15.7 (H) 06/23/2023 1607   Iron  Studies    Component Value Date/Time   IRON  61 06/23/2023 1607   TIBC 385 06/23/2023 1607   FERRITIN 102 06/23/2023 1607   IRONPCTSAT 16 06/23/2023 1607   Lipid Panel     Component Value Date/Time   CHOL 132 06/23/2023 1607   TRIG 72 06/23/2023 1607   HDL 39 (L) 06/23/2023 1607   CHOLHDL 3.4 06/23/2023 1607   LDLCALC 78 06/23/2023 1607   Hepatic Function Panel     Component Value Date/Time   PROT 7.6 06/23/2023 1607   ALBUMIN 4.1 06/23/2023 1607   AST 20 06/23/2023 1607   ALT 17 06/23/2023 1607   ALKPHOS 121 06/23/2023 1607   BILITOT 0.2 06/23/2023 1607      Component Value Date/Time   TSH 1.180 07/09/2021 0906   Nutritional Lab Results  Component Value Date   VD25OH 51.1 06/23/2023   VD25OH 40.6 12/09/2022   VD25OH 31.3 03/25/2022    Attestations:   I, Sonny Laroche, acting as a Stage manager for Barnie Jenkins, DO., have compiled all relevant documentation for today's office visit on behalf of Barnie Jenkins, DO, while in the presence of [employer], DO.   I have reviewed the above documentation for accuracy and completeness, and I agree with  the above. Barnie JINNY Jenkins, D.O.  The 21st Century Cures  Act was signed into law in 2016 which includes the topic of electronic health records.  This provides immediate access to information in MyChart.  This includes consultation notes, operative notes, office notes, lab results and pathology reports.  If you have any questions about what you read please let us  know at your next visit so we can discuss your concerns and take corrective action if need be.  We are right here with you.

## 2024-02-10 ENCOUNTER — Other Ambulatory Visit (HOSPITAL_COMMUNITY): Payer: Self-pay

## 2024-02-10 ENCOUNTER — Telehealth (INDEPENDENT_AMBULATORY_CARE_PROVIDER_SITE_OTHER): Payer: Self-pay

## 2024-02-10 ENCOUNTER — Ambulatory Visit (INDEPENDENT_AMBULATORY_CARE_PROVIDER_SITE_OTHER): Admitting: Family Medicine

## 2024-02-10 ENCOUNTER — Encounter (INDEPENDENT_AMBULATORY_CARE_PROVIDER_SITE_OTHER): Payer: Self-pay | Admitting: Family Medicine

## 2024-02-10 DIAGNOSIS — G44009 Cluster headache syndrome, unspecified, not intractable: Secondary | ICD-10-CM | POA: Diagnosis not present

## 2024-02-10 DIAGNOSIS — J3089 Other allergic rhinitis: Secondary | ICD-10-CM | POA: Diagnosis not present

## 2024-02-10 DIAGNOSIS — E559 Vitamin D deficiency, unspecified: Secondary | ICD-10-CM

## 2024-02-10 DIAGNOSIS — R7303 Prediabetes: Secondary | ICD-10-CM

## 2024-02-10 DIAGNOSIS — Z6841 Body Mass Index (BMI) 40.0 and over, adult: Secondary | ICD-10-CM

## 2024-02-10 DIAGNOSIS — F4323 Adjustment disorder with mixed anxiety and depressed mood: Secondary | ICD-10-CM

## 2024-02-10 DIAGNOSIS — J302 Other seasonal allergic rhinitis: Secondary | ICD-10-CM

## 2024-02-10 MED ORDER — METFORMIN HCL ER 500 MG PO TB24
500.0000 mg | ORAL_TABLET | Freq: Every day | ORAL | 0 refills | Status: DC
  Filled 2024-02-10 – 2024-03-16 (×3): qty 30, 30d supply, fill #0

## 2024-02-10 MED ORDER — LEVOCETIRIZINE DIHYDROCHLORIDE 5 MG PO TABS
5.0000 mg | ORAL_TABLET | Freq: Every evening | ORAL | 0 refills | Status: DC
  Filled 2024-02-10: qty 30, 30d supply, fill #0
  Filled 2024-03-03 – 2024-03-16 (×2): qty 30, 30d supply, fill #1

## 2024-02-10 NOTE — Progress Notes (Signed)
 Donna Bond, D.O.  ABFM, ABOM Specializing in Clinical Bariatric Medicine  Office located at: [address] [place], [place]  [address]      A) FOR THE CHRONIC DISEASE OF OBESITY:  Chief complaint: Obesity Donna Bond is here to discuss her progress with her obesity treatment plan.   History of present illness / Interval history:  Donna Bond is here today for her follow-up office visit.  Since last OV on 01/27/24, pt is down 3 lbs. Pt states that she is doing a better job of taking care of herself and putting herself first. She went on vacation recently to the beach.     01/27/24 16:00 02/10/24 15:00   Body Fat % 55.3 % 55.4 %  Muscle Mass (lbs) 136.8 lbs 135.4 lbs  Fat Mass (lbs) 178.2 lbs 177 lbs  Total Body Water (lbs) 98.8 lbs 97.8 lbs  Visceral Fat Rating  23 23    Counseling done on how various foods will affect these numbers and how to maximize success   Total lbs lost to date: -6 lbs Total Fat Mass in lbs lost to date: - 16 lbs Total weight loss percentage to date: - 1.85 %    Morbid obesity (HCC)-start bmi 59.44/date 05/1720 BMI 50.0-59.9, adult (HCC) -- Current BMI 58.33  Nutrition Therapy She is on the Category 1 Plan with B and L options and 6-8 ounces of protein at lunch and states she is following her eating plan approximately 60 % of the time.   - Tracking Calories/Macros: no   - Eating More Whole Foods: yes  - Adequate Protein Intake: yes  - Adequate Water Intake: yes  - Skipping Meals: yes  - Sleeping 7-9 Hours/ Night: yes   Donna Bond is currently in the action stage of change. As such, her goal is to continue weight management plan.  She has agreed to: Journal 1200 calories with minimum 80g protein    Physical Activity Donna Bond is walking 30  minutes 4 days per week  Donna Bond has been advised to work up to 300-450 minutes of moderate intensity aerobic activity a week and strengthening exercises 2-3 times per week for cardiovascular health, weight  loss maintenance and preservation of muscle mass.   She has agreed to : Think about enjoyable ways to increase daily physical activity and overcoming barriers to exercise, Increase physical activity in their day and reduce sedentary time (increase NEAT)., and Increase volume of physical activity to a goal of 240 minutes a week   Behavioral Modifications Evidence-based interventions for health behavior change were utilized today including the discussion of   1) problem-solving barriers:    - Journal to help with accountability  2) self care:    - Find time now to build healthy habits, focus on mental wellbeing and self care during this time out of work.  3) SMART goals for next OV:    - Walk 30 min 5 days a week   Regarding patient's less desirable eating habits and patterns, we employed the technique of small changes.   We discussed the following today: increasing lean protein intake to established goals, avoiding skipping meals, increasing water intake , work on tracking and journaling calories using tracking application, and work on managing stress, creating time for self-care and relaxation Additional resources provided today: Handout on Daily Food Journaling Log   Medical Interventions/ Pharmacotherapy Previous Bariatric surgery: n/a Pharmacotherapy for weight loss: She is currently taking Metformin  XR 500 mg once daily, Trokendi  XR  50 mg once daily for medical weight loss.    We discussed various medication options to help Donna Bond with her weight loss efforts and we both agreed to : Continue with current nutritional and behavioral strategies and Adequate clinical response to anti-obesity medication, continue current regimen   B) OBESITY RELATED CONDITIONS ADDRESSED TODAY:  Cluster headache, not intractable, unspecified chronicity pattern Assessment & Plan On Trokendi  XR 50 mg daily. Good compliance and tolerance. Pt states that her headaches are better. She endorses that she thinks  that this is helping with her cravings. She states that her cravings are not really there anymore and she has good control of cravings. Continue with medications. No dosage change today.     Vitamin D  deficiency Assessment & Plan Lab Results  Component Value Date   VD25OH 51.1 06/23/2023   VD25OH 40.6 12/09/2022   VD25OH 31.3 03/25/2022   On ERGO 50K units weekly. Good compliance and tolerance. No acute concerns today. Continue supplementation. Will obtain labs in the near future as pt declines today.     Environmental and seasonal allergies Assessment & Plan Pt states that she got a refill of Xyzal  last visit and even though she was prescribed 90 days she was only given 30 days worth. She endorses that with Xyzal  her symptoms are well controlled. Recommended pt to reach out to the pharmacy and see if she can be given the rest of the prescription. Will refill today just in case. Advised pt to try Flonase  to help her symptoms as well as preventive strategies.     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 Pt feels like her mood is stable. She has felt like she is a little more stressed but has not felt like it has affected her any more than usual. Continue to focus on self care.  Cont medications. Will reassess at next OV.      Prediabetes Assessment & Plan Lab Results  Component Value Date   HGBA1C 5.7 (H) 06/23/2023   HGBA1C 5.9 (H) 12/09/2022   HGBA1C 5.5 03/25/2022   INSULIN  13.9 06/23/2023   INSULIN  6.1 03/25/2022   INSULIN  9.7 10/09/2021    On Metformin  XR 500 mg once daily. Good compliance and tolerance. Pt states that she has no excessive hunger. She is focusing on getting all of her protein in. Continue to follow meal plan and taking medications. Will refill today.     Medications Discontinued During This Encounter  Medication Reason   levocetirizine (XYZAL ) 5 MG  tablet Reorder   metFORMIN  (GLUCOPHAGE -XR) 500 MG 24 hr tablet Reorder     Meds ordered this encounter  Medications   metFORMIN  (GLUCOPHAGE -XR) 500 MG 24 hr tablet    Sig: Take 1 tablet (500 mg total) by mouth daily with breakfast.    Dispense:  30 tablet    Refill:  0   levocetirizine (XYZAL ) 5 MG tablet    Sig: Take 1 tablet (5 mg total) by mouth every evening.    Dispense:  90 tablet    Refill:  0    Please give 90d supply      Follow up:   No follow-ups on file.  She was informed of the importance of frequent follow up visits to maximize her success with intensive lifestyle modifications for her multiple health conditions.   Weight Summary and Biometrics   Weight Lost Since Last Visit: 3lb  Weight  Gained Since Last Visit: 0lb    Vitals Temp: 98.6 F (37 C) BP: 124/79 Pulse Rate: 80 SpO2: 99 %   Anthropometric Measurements Height: 5' 2 (1.575 m) Weight: (!) 319 lb (144.7 kg) BMI (Calculated): 58.33 Weight at Last Visit: 322lb Weight Lost Since Last Visit: 3lb Weight Gained Since Last Visit: 0lb Starting Weight: 325lb Total Weight Loss (lbs): 6 lb (2.722 kg)   Body Composition  Body Fat %: 55.4 % Fat Mass (lbs): 177 lbs Muscle Mass (lbs): 135.4 lbs Total Body Water (lbs): 97.8 lbs Visceral Fat Rating : 23   Other Clinical Data Fasting: no Labs: no Today's Visit #: 48 Starting Date: 06/08/20    Objective:   PHYSICAL EXAM: Blood pressure 124/79, pulse 80, temperature 98.6 F (37 C), height 5' 2 (1.575 m), weight (!) 319 lb (144.7 kg), SpO2 99%. Body mass index is 58.35 kg/m.  General: she is overweight, cooperative and in no acute distress. PSYCH: Has normal mood, affect and thought process.   HEENT: EOMI, sclerae are anicteric. Lungs: Normal breathing effort, no conversational dyspnea. Extremities: Moves * 4 Neurologic: A and O * 3, good insight  DIAGNOSTIC DATA REVIEWED: BMET    Component Value Date/Time   NA 141 06/23/2023 1607    K 4.5 06/23/2023 1607   CL 104 06/23/2023 1607   CO2 19 (L) 06/23/2023 1607   GLUCOSE 75 06/23/2023 1607   GLUCOSE 100 (H) 06/07/2022 2350   BUN 11 06/23/2023 1607   CREATININE 0.81 06/23/2023 1607   CALCIUM 8.8 06/23/2023 1607   GFRNONAA >60 06/07/2022 2350   GFRAA 114 06/08/2020 1015   Lab Results  Component Value Date   HGBA1C 5.7 (H) 06/23/2023   HGBA1C 5.8 06/10/2017   Lab Results  Component Value Date   INSULIN  13.9 06/23/2023   INSULIN  18.2 06/08/2020   Lab Results  Component Value Date   TSH 1.180 07/09/2021   CBC    Component Value Date/Time   WBC 10.0 06/23/2023 1607   WBC 15.8 (H) 06/08/2022 0120   RBC 6.11 (H) 06/23/2023 1607   RBC 5.14 (H) 06/08/2022 0120   HGB 13.1 06/23/2023 1607   HCT 43.3 06/23/2023 1607   PLT 322 06/23/2023 1607   MCV 71 (L) 06/23/2023 1607   MCH 21.4 (L) 06/23/2023 1607   MCH 22.0 (L) 06/08/2022 0120   MCHC 30.3 (L) 06/23/2023 1607   MCHC 31.1 06/08/2022 0120   RDW 15.7 (H) 06/23/2023 1607   Iron  Studies    Component Value Date/Time   IRON  61 06/23/2023 1607   TIBC 385 06/23/2023 1607   FERRITIN 102 06/23/2023 1607   IRONPCTSAT 16 06/23/2023 1607   Lipid Panel     Component Value Date/Time   CHOL 132 06/23/2023 1607   TRIG 72 06/23/2023 1607   HDL 39 (L) 06/23/2023 1607   CHOLHDL 3.4 06/23/2023 1607   LDLCALC 78 06/23/2023 1607   Hepatic Function Panel     Component Value Date/Time   PROT 7.6 06/23/2023 1607   ALBUMIN 4.1 06/23/2023 1607   AST 20 06/23/2023 1607   ALT 17 06/23/2023 1607   ALKPHOS 121 06/23/2023 1607   BILITOT 0.2 06/23/2023 1607      Component Value Date/Time   TSH 1.180 07/09/2021 0906   Nutritional Lab Results  Component Value Date   VD25OH 51.1 06/23/2023   VD25OH 40.6 12/09/2022   VD25OH 31.3 03/25/2022    Attestations:   I, Donna Bond, acting as a stage manager for [employer]  Donna Decook, DO., have compiled all relevant documentation for today's office visit on behalf of  Donna Jenkins, DO, while in the presence of [employer], DO.   I have reviewed the above documentation for accuracy and completeness, and I agree with the above. Donna Bond, D.O.  The 21st Century Cures Act was signed into law in 2016 which includes the topic of electronic health records.  This provides immediate access to information in MyChart.  This includes consultation notes, operative notes, office notes, lab results and pathology reports.  If you have any questions about what you read please let us  know at your next visit so we can discuss your concerns and take corrective action if need be.  We are right here with you.

## 2024-02-10 NOTE — Telephone Encounter (Signed)
 PA for Trokendi  XR has been submitted, awaiting PA questions.

## 2024-02-11 NOTE — Telephone Encounter (Signed)
 PA for Trokendi  was not approved due to no coverage.  This is the message we received from the patients insurance:   There was an error with your request Patient does not have active coverage with the payer.

## 2024-02-13 ENCOUNTER — Other Ambulatory Visit (HOSPITAL_COMMUNITY): Payer: Self-pay

## 2024-03-03 ENCOUNTER — Ambulatory Visit (INDEPENDENT_AMBULATORY_CARE_PROVIDER_SITE_OTHER): Payer: Self-pay | Admitting: Family Medicine

## 2024-03-04 ENCOUNTER — Telehealth (HOSPITAL_COMMUNITY): Payer: Self-pay

## 2024-03-04 ENCOUNTER — Other Ambulatory Visit (HOSPITAL_COMMUNITY): Payer: Self-pay

## 2024-03-04 ENCOUNTER — Other Ambulatory Visit: Payer: Self-pay

## 2024-03-04 ENCOUNTER — Encounter (HOSPITAL_COMMUNITY): Payer: Self-pay

## 2024-03-04 ENCOUNTER — Ambulatory Visit: Payer: Self-pay

## 2024-03-04 NOTE — Telephone Encounter (Signed)
 PA request has been Received. New Encounter has been or will be created for follow up. For additional info see Pharmacy Prior Auth telephone encounter from 03/04/24.

## 2024-03-04 NOTE — Telephone Encounter (Signed)
 Pharmacy Patient Advocate Encounter   Received notification from Pt Calls Messages that prior authorization for Topiramate  ER 50 mg capsules is required/requested.   Insurance verification completed.   The patient is insured through [facility] MEDICAID.   Per test claim: Per test claim, medication is not covered due to plan/benefit exclusion, PA not submitted at this time

## 2024-03-16 ENCOUNTER — Other Ambulatory Visit (HOSPITAL_COMMUNITY): Payer: Self-pay

## 2024-03-26 ENCOUNTER — Telehealth: Admitting: Family Medicine

## 2024-03-26 DIAGNOSIS — M25511 Pain in right shoulder: Secondary | ICD-10-CM | POA: Diagnosis not present

## 2024-03-26 DIAGNOSIS — M542 Cervicalgia: Secondary | ICD-10-CM

## 2024-03-26 MED ORDER — NAPROXEN 500 MG PO TABS: 500.0000 mg | ORAL_TABLET | Freq: Two times a day (BID) | ORAL | 0 refills | Status: AC

## 2024-03-26 MED ORDER — BACLOFEN 10 MG PO TABS: 10.0000 mg | ORAL_TABLET | Freq: Three times a day (TID) | ORAL | 0 refills | Status: AC

## 2024-03-26 NOTE — Patient Instructions (Signed)
 Cervical Sprain A cervical sprain is a stretch or tear in one or more of the ligaments in the neck. Ligaments are the tissues that connect bones to each other. Cervical sprains can range from mild to severe. Severe cervical sprains can cause the spinal bones (vertebrae) in the neck to be unstable. This can result in spinal cord damage and serious nervous system problems. Healing time for a cervical sprain depends on the cause and extent of the injury. Most cervical sprains heal in 4-6 weeks. What are the causes? Cervical sprains may be caused by trauma, such as an injury from a motor vehicle accident, a fall, or a sudden forward and backward whipping movement of the head and neck (whiplash injury). Mild cervical sprains may be caused by wear and tear over time. What increases the risk? You are more likely to get a cervical sprain if: You take part in activities that have a high risk of trauma to the neck. These include contact sports, gymnastics, and diving. You have: Osteoarthritis of the spine. Poor strength and flexibility of the neck. Poor posture. You have had a neck injury in the past. You spend long periods in positions that put stress on the neck, such as sitting at a computer. What are the signs or symptoms? Symptoms of this condition include: Any of these problems in the neck, shoulders, or upper back: Pain or tenderness. Stiffness. Swelling. A burning feeling. Sudden tightening of neck muscles (spasms). Limited ability to move the neck. Headache. Dizziness. Nausea or vomiting. Weakness, numbness, or tingling in a hand or an arm. Symptoms may develop right away after injury or may develop over a few days. In some cases, symptoms may go away with treatment and return (recur) over time. How is this diagnosed? This condition may be diagnosed based on: Your symptoms, medical history, and a physical exam. Any recent injuries or known neck problems that you have, such as arthritis  in the neck. Imaging tests, such as X-rays, an MRI, or a CT scan. How is this treated? This condition is treated by resting and icing the injured area and doing physical therapy exercises to improve movement and strength. Heat therapy may be used 2-3 days after the injury if there is no swelling. Depending on the severity of your condition, treatment may also include: Keeping your neck in place (immobilized) for periods of time. This may be done using: A cervical collar. This supports your chin and the back of your head. A cervical traction device. This is a sling that holds up your head. It removes weight and pressure from your neck. Medicines for pain or other symptoms. Surgery. This is rare. Follow these instructions at home: Medicines Take over-the-counter and prescription medicines only as told by your health care provider. Ask your provider if the medicine prescribed to you: Requires you to avoid driving or using machinery. Can cause constipation. You may need to take these actions to prevent or treat constipation: Drink enough fluid to keep your pee pale yellow. Take over-the-counter or prescription medicines. Eat foods that are high in fiber, such as beans, whole grains, and fresh fruits and vegetables. Limit foods that are high in fat and processed sugars, such as fried or sweet foods. If you have a cervical collar: Wear the collar as told by your provider. Do not remove it unless told. Ask before making any adjustments to your collar. If you have long hair, keep it outside of the collar. If you are allowed to remove the  collar for cleaning and bathing: Follow instructions about how to remove it safely. Clean it by hand with mild soap and water and air-dry it completely. If your collar has removable pads, remove them every 1-2 days and wash them by hand with soap and water. Let them air-dry completely before putting them back in the collar. Tell your provider if your skin under  the collar has irritation or sores. Managing pain, stiffness, and swelling     Use a cervical traction device as told. If told, put ice on the affected area. Put ice in a plastic bag. Place a towel between your skin and the bag. Leave the ice on for 20 minutes, 2-3 times a day. If told, apply heat to the affected area before you exercise or as often as told by your provider. Use the heat source that your provider recommends, such as a moist heat pack or a heating pad. Place a towel between your skin and the heat source. Leave the heat on for 20-30 minutes. If your skin turns bright red, remove the ice or heat right away to prevent skin damage. The risk of damage is higher if you cannot feel pain, heat, or cold. Activity Do not drive while wearing a cervical collar. If you do not have a cervical collar, ask if it is safe to drive while your neck heals. Do not lift anything that is heavier than 10 lb (4.5 kg) until your provider says that it is safe. Rest as told by your provider. Avoid positions and activities that make your symptoms worse. Do physical therapy exercises as told by your provider or physical therapist. Return to your normal activities as told by your provider. Ask your provider what activities are safe for you. General instructions Do not use any products that contain nicotine or tobacco. These products include cigarettes, chewing tobacco, and vaping devices, such as e-cigarettes. These can delay healing. If you need help quitting, ask your provider. Keep all follow-up visits. Your provider will monitor your injury and activity level. How is this prevented? To prevent a cervical sprain from happening again: Use and maintain good posture. Make any needed adjustments to your workstation to help you do this. Exercise regularly as told by your provider or physical therapist. Avoid risky activities that may cause a cervical sprain. Contact a health care provider if: You have  symptoms that get worse or do not get better after 2 weeks of treatment. You have new symptoms. Your pain gets worse or does not get better with medicine. You have sores or irritated skin on your neck from wearing your cervical collar. Get help right away if: You have severe pain. You develop numbness, tingling, or weakness in any part of your body. You cannot move a part of your body (you have paralysis). You have neck pain along with severe dizziness or headache. This information is not intended to replace advice given to you by your health care provider. Make sure you discuss any questions you have with your health care provider. Document Revised: 11/09/2021 Document Reviewed: 11/09/2021 Elsevier Patient Education  2024 [employer].

## 2024-03-26 NOTE — Progress Notes (Signed)
 Virtual Visit Consent   Lakedra Murad, you are scheduled for a virtual visit with a [facility] provider today. Just as with appointments in the office, your consent must be obtained to participate. Your consent will be active for this visit and any virtual visit you may have with one of our providers in the next 365 days. If you have a MyChart account, a copy of this consent can be sent to you electronically.  As this is a virtual visit, video technology does not allow for your provider to perform a traditional examination. This may limit your provider's ability to fully assess your condition. If your provider identifies any concerns that need to be evaluated in person or the need to arrange testing (such as labs, EKG, etc.), we will make arrangements to do so. Although advances in technology are sophisticated, we cannot ensure that it will always work on either your end or our end. If the connection with a video visit is poor, the visit may have to be switched to a telephone visit. With either a video or telephone visit, we are not always able to ensure that we have a secure connection.  By engaging in this virtual visit, you consent to the provision of healthcare and authorize for your insurance to be billed (if applicable) for the services provided during this visit. Depending on your insurance coverage, you may receive a charge related to this service.  I need to obtain your verbal consent now. Are you willing to proceed with your visit today? Donna Bond has provided verbal consent on 03/26/2024 for a virtual visit (video or telephone). Loa Lamp, FNP  Date: 03/26/2024 5:17 PM   Virtual Visit via Video Note   I, Loa Lamp, connected with  Donna Bond  ([id number], 10/28/1980) on 03/26/24 at  5:15 PM EST by a video-enabled telemedicine application and verified that I am speaking with the correct person using two identifiers.  Location: Patient: Virtual Visit Location Patient: Home Provider:  Virtual Visit Location Provider: Home Office   I discussed the limitations of evaluation and management by telemedicine and the availability of in person appointments. The patient expressed understanding and agreed to proceed.    History of Present Illness: Donna Bond is a 43 y.o. who identifies as a female who was assigned female at birth, and is being seen today for pain in rt shoulder and neck. Trap tight. NKI. May have slept unusual. Pain with movements. No fever.   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 01/06/2024   Cluster headache, not intractable 01/06/2024   Snoring 03/17/2023   Class 3 severe obesity with serious comorbidity and body mass index (BMI) of 50.0 to 59.9 in adult [facility]) 03/25/2022   SOBOE (shortness of breath on exertion) 03/25/2022   Prediabetes 03/25/2022   Routine general medical examination at a health care facility 03/13/2022   Low HDL (under 40) 10/09/2021   Mood disorder (HCC) with emotional eating 06/18/2021   Depression 06/08/2020   Vitamin D  deficiency 06/08/2020   Dietary iron  deficiency 06/08/2020   Morbid obesity (HCC) 08/18/2017    Allergies: No Known Allergies Medications:  Current Outpatient Medications:    baclofen  (LIORESAL ) 10 MG tablet, Take 1 tablet (10 mg total) by mouth 3 (three) times daily., Disp: 30 each, Rfl: 0   naproxen  (NAPROSYN ) 500 MG tablet, Take 1 tablet (500 mg total) by mouth 2 (two)  times daily with a meal., Disp: 30 tablet, Rfl: 0   albuterol  (VENTOLIN  HFA) 108 (90 Base) MCG/ACT inhaler, Inhale 2 puffs into the lungs every 6 (six) hours as needed for wheezing or shortness of breath., Disp: 6.7 g, Rfl: 0   buPROPion  (WELLBUTRIN  XL) 300 MG 24 hr tablet, Take 1 tablet (300 mg total) by mouth daily., Disp: 90 tablet, Rfl: 0   cyclobenzaprine  (FLEXERIL ) 5 MG tablet, Take 1 tablet (5 mg total) by mouth 3 (three) times  daily as needed for muscle spasms., Disp: 30 tablet, Rfl: 1   ferrous sulfate  325 (65 FE) MG tablet, Take 1 tablet (325 mg total) by mouth 2 (two) times daily with a meal., Disp: , Rfl:    fluticasone  (FLONASE ) 50 MCG/ACT nasal spray, Place 1 spray into both nostrils 2 (two) times daily., Disp: 16 g, Rfl: 2   levocetirizine (XYZAL ) 5 MG tablet, Take 1 tablet (5 mg total) by mouth every evening., Disp: 90 tablet, Rfl: 0   loratadine -pseudoephedrine  (SM LORATA-DINE D) 10-240 MG 24 hr tablet, Take 1 tablet by mouth daily., Disp: 90 tablet, Rfl: 3   metFORMIN  (GLUCOPHAGE -XR) 500 MG 24 hr tablet, Take 1 tablet (500 mg total) by mouth daily with breakfast., Disp: 30 tablet, Rfl: 0   sertraline  (ZOLOFT ) 50 MG tablet, Take 1 tablet (50 mg total) by mouth daily., Disp: 90 tablet, Rfl: 0   Topiramate  ER (TROKENDI  XR) 50 MG CP24, Take 1 capsule (50 mg total) by mouth daily., Disp: 90 capsule, Rfl: 0   Vitamin D , Ergocalciferol , (DRISDOL ) 1.25 MG (50000 UNIT) CAPS capsule, Take 1 capsule (50,000 Units total) by mouth every 6 days, Disp: 13 capsule, Rfl: 0  Observations/Objective: Patient is well-developed, well-nourished in no acute distress.  Resting comfortably  at home.  Head is normocephalic, atraumatic.  No labored breathing.  Speech is clear and coherent with logical content.  Patient is alert and oriented at baseline.    Assessment and Plan: 1. Neck pain (Primary)  2. Acute pain of right shoulder  Heat, med use and side effects discussed, UC as needed.   Follow Up Instructions: I discussed the assessment and treatment plan with the patient. The patient was provided an opportunity to ask questions and all were answered. The patient agreed with the plan and demonstrated an understanding of the instructions.  A copy of instructions were sent to the patient via MyChart unless otherwise noted below.     The patient was advised to call back or seek an in-person evaluation if the symptoms worsen  or if the condition fails to improve as anticipated.    Donna Stambaugh, FNP

## 2024-03-31 ENCOUNTER — Ambulatory Visit (INDEPENDENT_AMBULATORY_CARE_PROVIDER_SITE_OTHER): Admitting: Family Medicine

## 2024-04-01 ENCOUNTER — Encounter (HOSPITAL_COMMUNITY): Payer: Self-pay

## 2024-04-01 ENCOUNTER — Ambulatory Visit (HOSPITAL_COMMUNITY)
Admission: RE | Admit: 2024-04-01 | Discharge: 2024-04-01 | Disposition: A | Discharge status: Home / Self Care | Source: Ambulatory Visit | Attending: Family Medicine | Admitting: Family Medicine

## 2024-04-01 ENCOUNTER — Other Ambulatory Visit (HOSPITAL_COMMUNITY): Payer: Self-pay

## 2024-04-01 VITALS — BP 128/70 | HR 71 | Temp 98.7°F | Resp 18

## 2024-04-01 DIAGNOSIS — J069 Acute upper respiratory infection, unspecified: Secondary | ICD-10-CM | POA: Diagnosis not present

## 2024-04-01 LAB — POC COVID19/FLU A&B COMBO
Covid Antigen, POC: NEGATIVE
Influenza A Antigen, POC: NEGATIVE
Influenza B Antigen, POC: NEGATIVE

## 2024-04-01 MED ORDER — BENZONATATE 100 MG PO CAPS
100.0000 mg | ORAL_CAPSULE | Freq: Three times a day (TID) | ORAL | 0 refills | Status: DC | PRN
  Filled 2024-04-01: qty 21, 7d supply, fill #0
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
## 2024-04-01 NOTE — ED Triage Notes (Signed)
 Pt c/o cough, congestion, sneezing, and watery eyes x4 days. States had a flu shot last wk. Denies taking any meds for sx's.

## 2024-04-01 NOTE — Discharge Instructions (Signed)
 Testing for flu and COVID is negative  Take benzonatate  100 mg, 1 tab every 8 hours as needed for cough.  You can also take DayQuil/NyQuil for your symptoms  Make sure you are getting plenty of oral fluids in

## 2024-04-12 ENCOUNTER — Other Ambulatory Visit (HOSPITAL_COMMUNITY): Payer: Self-pay

## 2024-04-13 ENCOUNTER — Ambulatory Visit
Admission: EM | Admit: 2024-04-13 | Discharge: 2024-04-13 | Disposition: A | Discharge status: Home / Self Care | Attending: Physician Assistant | Admitting: Physician Assistant

## 2024-04-13 ENCOUNTER — Other Ambulatory Visit: Payer: Self-pay

## 2024-04-13 ENCOUNTER — Encounter: Payer: Self-pay | Admitting: *Deleted

## 2024-04-13 DIAGNOSIS — R051 Acute cough: Secondary | ICD-10-CM | POA: Diagnosis not present

## 2024-04-13 DIAGNOSIS — B349 Viral infection, unspecified: Secondary | ICD-10-CM

## 2024-04-13 LAB — POCT INFLUENZA A/B
Influenza A, POC: NEGATIVE
Influenza B, POC: NEGATIVE

## 2024-04-13 MED ORDER — ALBUTEROL SULFATE HFA 108 (90 BASE) MCG/ACT IN AERS
2.0000 | INHALATION_SPRAY | Freq: Once | RESPIRATORY_TRACT | Status: AC
  Administered 2024-04-13: 2 via RESPIRATORY_TRACT

## 2024-04-13 NOTE — ED Triage Notes (Signed)
 C/O persistent cough onset 2 days ago. Also started with rhinorrhea today. No known fevers. Has been taking Theraflu and Alka Seltzer tabs this AM.
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
## 2024-04-13 NOTE — Discharge Instructions (Addendum)
 Albuterol  2 puffs every 4 hours. Return if symptoms worsen or change.

## 2024-05-25 ENCOUNTER — Other Ambulatory Visit (HOSPITAL_COMMUNITY): Payer: Self-pay

## 2024-05-25 ENCOUNTER — Ambulatory Visit (INDEPENDENT_AMBULATORY_CARE_PROVIDER_SITE_OTHER): Admitting: Family Medicine

## 2024-05-25 ENCOUNTER — Telehealth (HOSPITAL_COMMUNITY): Payer: Self-pay

## 2024-05-25 ENCOUNTER — Encounter (INDEPENDENT_AMBULATORY_CARE_PROVIDER_SITE_OTHER): Payer: Self-pay | Admitting: Family Medicine

## 2024-05-25 DIAGNOSIS — F4323 Adjustment disorder with mixed anxiety and depressed mood: Secondary | ICD-10-CM

## 2024-05-25 DIAGNOSIS — E559 Vitamin D deficiency, unspecified: Secondary | ICD-10-CM

## 2024-05-25 DIAGNOSIS — R7303 Prediabetes: Secondary | ICD-10-CM

## 2024-05-25 DIAGNOSIS — D508 Other iron deficiency anemias: Secondary | ICD-10-CM

## 2024-05-25 DIAGNOSIS — J3089 Other allergic rhinitis: Secondary | ICD-10-CM

## 2024-05-25 DIAGNOSIS — Z6841 Body Mass Index (BMI) 40.0 and over, adult: Secondary | ICD-10-CM

## 2024-05-25 MED ORDER — LEVOCETIRIZINE DIHYDROCHLORIDE 5 MG PO TABS
5.0000 mg | ORAL_TABLET | Freq: Every evening | ORAL | 0 refills | Status: AC
  Filled 2024-05-25: qty 30, 30d supply, fill #0

## 2024-05-25 MED ORDER — FLUTICASONE PROPIONATE 50 MCG/ACT NA SUSP
1.0000 | Freq: Two times a day (BID) | NASAL | 2 refills | Status: AC
  Filled 2024-05-25: qty 16, 30d supply, fill #0

## 2024-05-25 MED ORDER — METFORMIN HCL ER 500 MG PO TB24
500.0000 mg | ORAL_TABLET | Freq: Every day | ORAL | 0 refills | Status: AC
  Filled 2024-05-25: qty 90, 90d supply, fill #0

## 2024-05-25 MED ORDER — VITAMIN D (ERGOCALCIFEROL) 1.25 MG (50000 UNIT) PO CAPS
50000.0000 [IU] | ORAL_CAPSULE | ORAL | 0 refills | Status: AC
  Filled 2024-05-25: qty 12, 84d supply, fill #0

## 2024-05-25 MED ORDER — BUPROPION HCL ER (XL) 300 MG PO TB24
300.0000 mg | ORAL_TABLET | Freq: Every day | ORAL | 0 refills | Status: AC
  Filled 2024-05-25: qty 90, 90d supply, fill #0

## 2024-05-25 MED ORDER — SERTRALINE HCL 50 MG PO TABS
50.0000 mg | ORAL_TABLET | Freq: Every day | ORAL | 0 refills | Status: AC
  Filled 2024-05-25: qty 90, 90d supply, fill #0

## 2024-05-25 MED ORDER — SEMAGLUTIDE-WEIGHT MANAGEMENT 0.25 MG/0.5ML ~~LOC~~ SOAJ
0.2500 mg | SUBCUTANEOUS | 1 refills | Status: AC
  Filled 2024-05-25 – 2024-05-28 (×2): qty 2, 28d supply, fill #0

## 2024-05-25 MED ORDER — FERROUS SULFATE 325 (65 FE) MG PO TABS
325.0000 mg | ORAL_TABLET | Freq: Two times a day (BID) | ORAL | 1 refills | Status: AC
  Filled 2024-05-25: qty 180, 90d supply, fill #0

## 2024-05-26 ENCOUNTER — Other Ambulatory Visit (HOSPITAL_COMMUNITY): Payer: Self-pay

## 2024-05-26 ENCOUNTER — Other Ambulatory Visit: Payer: Self-pay

## 2024-05-28 ENCOUNTER — Other Ambulatory Visit (HOSPITAL_COMMUNITY): Payer: Self-pay

## 2024-06-22 ENCOUNTER — Ambulatory Visit (INDEPENDENT_AMBULATORY_CARE_PROVIDER_SITE_OTHER): Admitting: Family Medicine
